# Patient Record
Sex: Male | Born: 1945 | Race: White | Hispanic: No | Marital: Married | Smoking: Never smoker
Health system: Southern US, Community
[De-identification: ages and names within clinical notes are randomized; demographics above are authoritative.]

## PROBLEM LIST (undated history)

## (undated) DIAGNOSIS — G35 Multiple sclerosis: Secondary | ICD-10-CM

## (undated) DIAGNOSIS — G2581 Restless legs syndrome: Secondary | ICD-10-CM

## (undated) DIAGNOSIS — E039 Hypothyroidism, unspecified: Secondary | ICD-10-CM

## (undated) DIAGNOSIS — Z87820 Personal history of traumatic brain injury: Secondary | ICD-10-CM

## (undated) DIAGNOSIS — R413 Other amnesia: Secondary | ICD-10-CM

## (undated) DIAGNOSIS — H3320 Serous retinal detachment, unspecified eye: Secondary | ICD-10-CM

## (undated) DIAGNOSIS — E785 Hyperlipidemia, unspecified: Secondary | ICD-10-CM

## (undated) DIAGNOSIS — E669 Obesity, unspecified: Secondary | ICD-10-CM

## (undated) DIAGNOSIS — C73 Malignant neoplasm of thyroid gland: Secondary | ICD-10-CM

## (undated) DIAGNOSIS — I10 Essential (primary) hypertension: Secondary | ICD-10-CM

## (undated) HISTORY — DX: Hyperlipidemia, unspecified: E78.5

## (undated) HISTORY — DX: Multiple sclerosis: G35

## (undated) HISTORY — DX: Restless legs syndrome: G25.81

## (undated) HISTORY — DX: Obesity, unspecified: E66.9

## (undated) HISTORY — DX: Hypothyroidism, unspecified: E03.9

## (undated) HISTORY — PX: CATARACT EXTRACTION: SUR2

## (undated) HISTORY — DX: Serous retinal detachment, unspecified eye: H33.20

## (undated) HISTORY — DX: Personal history of traumatic brain injury: Z87.820

## (undated) HISTORY — DX: Malignant neoplasm of thyroid gland: C73

## (undated) HISTORY — PX: OTHER SURGICAL HISTORY: SHX169

## (undated) HISTORY — PX: TONSILLECTOMY: SUR1361

## (undated) HISTORY — DX: Essential (primary) hypertension: I10

## (undated) HISTORY — DX: Other amnesia: R41.3

## (undated) HISTORY — PX: VASECTOMY: SHX75

---

## 2001-07-07 DIAGNOSIS — I10 Essential (primary) hypertension: Secondary | ICD-10-CM | POA: Insufficient documentation

## 2003-08-10 DIAGNOSIS — L219 Seborrheic dermatitis, unspecified: Secondary | ICD-10-CM | POA: Insufficient documentation

## 2003-08-10 DIAGNOSIS — J189 Pneumonia, unspecified organism: Secondary | ICD-10-CM | POA: Insufficient documentation

## 2003-12-24 DIAGNOSIS — Z9852 Vasectomy status: Secondary | ICD-10-CM | POA: Insufficient documentation

## 2003-12-24 DIAGNOSIS — E039 Hypothyroidism, unspecified: Secondary | ICD-10-CM | POA: Diagnosis present

## 2006-05-24 ENCOUNTER — Encounter: Admission: RE | Admit: 2006-05-24 | Discharge: 2006-05-24 | Payer: Self-pay | Admitting: Family Medicine

## 2006-05-26 ENCOUNTER — Encounter: Admission: RE | Admit: 2006-05-26 | Discharge: 2006-05-26 | Payer: Self-pay | Admitting: Family Medicine

## 2009-09-18 ENCOUNTER — Observation Stay (HOSPITAL_COMMUNITY): Admission: EM | Admit: 2009-09-18 | Discharge: 2009-09-20 | Payer: Self-pay | Admitting: Emergency Medicine

## 2009-09-19 ENCOUNTER — Encounter (INDEPENDENT_AMBULATORY_CARE_PROVIDER_SITE_OTHER): Payer: Self-pay | Admitting: Internal Medicine

## 2009-09-19 ENCOUNTER — Ambulatory Visit: Payer: Self-pay | Admitting: Cardiology

## 2009-12-12 ENCOUNTER — Encounter: Admission: RE | Admit: 2009-12-12 | Discharge: 2009-12-12 | Payer: Self-pay | Admitting: Internal Medicine

## 2010-04-18 LAB — COMPREHENSIVE METABOLIC PANEL
ALT: 16 U/L (ref 0–53)
ALT: 20 U/L (ref 0–53)
AST: 19 U/L (ref 0–37)
AST: 43 U/L — ABNORMAL HIGH (ref 0–37)
Alkaline Phosphatase: 42 U/L (ref 39–117)
Alkaline Phosphatase: 44 U/L (ref 39–117)
CO2: 26 mEq/L (ref 19–32)
CO2: 27 mEq/L (ref 19–32)
Calcium: 8.3 mg/dL — ABNORMAL LOW (ref 8.4–10.5)
Calcium: 8.9 mg/dL (ref 8.4–10.5)
Chloride: 103 mEq/L (ref 96–112)
GFR calc Af Amer: >60 mL/min (ref >60)
GFR calc Af Amer: >60 mL/min (ref >60)
GFR calc non Af Amer: >60 mL/min (ref >60)
GFR calc non Af Amer: >60 mL/min (ref >60)
Glucose, Bld: 106 mg/dL — ABNORMAL HIGH (ref 70–99)
Potassium: 3.6 mEq/L (ref 3.5–5.1)
Potassium: 4.3 mEq/L (ref 3.5–5.1)
Sodium: 136 mEq/L (ref 135–145)
Sodium: 140 mEq/L (ref 135–145)
Total Protein: 6.2 g/dL (ref 6.0–8.3)

## 2010-04-18 LAB — DIFFERENTIAL
Basophils Relative: 0 % (ref 0–1)
Eosinophils Absolute: 0.1 10*3/uL (ref 0.0–0.7)
Eosinophils Relative: 3 % (ref 0–5)
Lymphs Abs: 1.6 10*3/uL (ref 0.7–4.0)
Monocytes Relative: 9 % (ref 3–12)
Neutrophils Relative %: 54 % (ref 43–77)

## 2010-04-18 LAB — CBC
HCT: 38.4 % — ABNORMAL LOW (ref 39.0–52.0)
HCT: 40.2 % (ref 39.0–52.0)
Hemoglobin: 13.4 g/dL (ref 13.0–17.0)
Hemoglobin: 13.9 g/dL (ref 13.0–17.0)
MCHC: 34.6 g/dL (ref 30.0–36.0)
RBC: 4.25 MIL/uL (ref 4.22–5.81)
WBC: 4.7 10*3/uL (ref 4.0–10.5)
WBC: 5.1 10*3/uL (ref 4.0–10.5)

## 2010-04-18 LAB — LIPID PANEL
Total CHOL/HDL Ratio: 3.8 RATIO
VLDL: 29 mg/dL (ref 0–40)

## 2010-04-18 LAB — POCT CARDIAC MARKERS
CKMB, poc: <1 ng/mL — ABNORMAL LOW (ref 1.0–8.0)
Myoglobin, poc: 103 ng/mL (ref 12–200)

## 2010-04-18 LAB — CSF CELL COUNT WITH DIFFERENTIAL: WBC, CSF: 1 /mm3 (ref 0–5)

## 2010-04-18 LAB — SEDIMENTATION RATE: Sed Rate: 7 mm/hr (ref 0–16)

## 2010-04-18 LAB — ANA: Anti Nuclear Antibody(ANA): NEGATIVE

## 2010-04-18 LAB — PROTEIN AND GLUCOSE, CSF: Total  Protein, CSF: 61 mg/dL — ABNORMAL HIGH (ref 15–45)

## 2010-04-18 LAB — T4, FREE: Free T4: 1.63 ng/dL (ref 0.80–1.80)

## 2012-06-01 ENCOUNTER — Telehealth: Payer: Self-pay | Admitting: *Deleted

## 2012-06-01 NOTE — Telephone Encounter (Signed)
I called patient. He just converted to Medicare through [employer]. His co-pay now for the Copaxone is $2000 a month. We will check and see if any patient assistance program can be used for him. All MS medications are going to be expensive, and Copaxone is probably of an average cost.

## 2012-06-01 NOTE — Telephone Encounter (Signed)
Patient called stating he has Medicare/Medicare supplement and the cost of Copaxone is about $2000.00 a month. Patient would like to know if there's another medication that's not so expensive.

## 2012-06-02 NOTE — Telephone Encounter (Signed)
I called the patient.  Provided him with the number to [employer] [phone]).  They will ask him questions about his Insurance and financial situation.  They may be able to offer him some assistance.  He says he will call them and see if they can help.

## 2012-06-06 ENCOUNTER — Ambulatory Visit (INDEPENDENT_AMBULATORY_CARE_PROVIDER_SITE_OTHER): Payer: Medicare Other | Admitting: Nurse Practitioner

## 2012-06-06 ENCOUNTER — Encounter: Payer: Self-pay | Admitting: Nurse Practitioner

## 2012-06-06 VITALS — BP 121/72 | HR 64 | Ht 70.0 in | Wt 216.0 lb

## 2012-06-06 DIAGNOSIS — G35 Multiple sclerosis: Secondary | ICD-10-CM

## 2012-06-06 DIAGNOSIS — R413 Other amnesia: Secondary | ICD-10-CM

## 2012-06-06 DIAGNOSIS — G35D Multiple sclerosis, unspecified: Secondary | ICD-10-CM

## 2012-06-06 NOTE — Progress Notes (Signed)
HPI: Patient returns for followup after last visit with Dr. Anne Hahn 12/14/2011. History multiple sclerosis and has done well on Copaxone. He has not had side effects of the medication  He was placed on Adderall at his last visit to help with ability to focus but is currently not taking that medication. He is also asking about getting off of the Copaxone  since he has Medicare and has been told he has a $2000 copay. Most recent MRI , November , 2013 with multiple chronic demyelinating lesions but no acute lesions.He denies  spasms, focal weakness, sensory changes, visual changes, speech or swallowing problems, no problems with bowel or bladder function.    ROS:   snoring, aching muscles, not enough sleep, RLS, insomnia, memory loss, confusion,  Physical Exam General: well developed, well nourished, seated, in no evident distress Head: head normocephalic and atraumatic. Oropharynx benign Neck: supple with no carotid or supraclavicular bruits Cardiovascular: regular rate and rhythm, no murmurs  Neurologic Exam Mental Status: Awake and  alert. Oriented to place and time. Follows all commands  Cranial Nerves: Fundoscopic exam reveals flat disc margins. Pupils equal, briskly reactive to light. Extraocular movements full without nystagmus. Visual fields full to confrontation. Hearing intact and symmetric to finger snap. Facial sensation intact. Face, tongue, palate move normally and symmetrically. Neck flexion and extension normal.  Motor: Normal bulk and tone. Normal strength in all tested extremity muscles. Sensory.: intact to touch and pinprick and vibratory.  Coordination: Rapid alternating movements normal in all extremities. Finger-to-nose and heel-to-shin performed accurately bilaterally. Gait and Station: Arises from chair without difficulty. Stance is normal. Gait demonstrates normal stride length and balance . Able to heel, toe and tandem walk without difficulty.  Reflexes: 1+ and symmetric. Toes  downgoing.     ASSESSMENT: Multiple sclerosis currently on Copaxone without side effects or exacerbations. Mild memory disturbance, Adderall was not helpful. Patient does not want to be on any medication.   PLAN: Explained the importance of disease modifying medications even though his most recent MRI did not have active lesions. Given him a copy of his most recent MRI Given the number for [employer] to check with patient assistance for his Copaxone Follow up in 6-8 months  Nilda Riggs, GNP-BC APRN

## 2012-06-06 NOTE — Progress Notes (Signed)
I have read the note, and I agree with the clinical assessment and plan.  

## 2012-06-06 NOTE — Patient Instructions (Addendum)
Copy of MRI report given. Please call [employer] about coverage for Copaxone F/U visit in 6 to 8 months

## 2012-06-09 ENCOUNTER — Telehealth: Payer: Self-pay | Admitting: *Deleted

## 2012-06-09 NOTE — Telephone Encounter (Signed)
I called patient. The patient is working on getting patient assistance for the Copaxone. If he is able to get this, I would recommend staying on the Copaxone. This medication is a preventative medication for the multiple sclerosis. Prior MRI scans done had shown good stability. If he remains very stable for greater than 5 or 6 years, it is possible that he may be a will come off of medications.

## 2012-06-09 NOTE — Telephone Encounter (Signed)
Patient called wanting to know if his MS is dormant should he keep taking Copaxone. Eventhough per the nurse Lupita Leash) he should keep taking medication, he would like to speak with physician to confirm.

## 2012-07-04 ENCOUNTER — Other Ambulatory Visit: Payer: Self-pay

## 2012-07-04 MED ORDER — GLATIRAMER ACETATE 20 MG/ML ~~LOC~~ KIT: 20.0000 mg | PACK | Freq: Every day | SUBCUTANEOUS | Status: DC

## 2012-07-06 ENCOUNTER — Telehealth: Payer: Self-pay | Admitting: Neurology

## 2012-07-06 NOTE — Telephone Encounter (Signed)
Patient called stating since its been a month without taking Copaxone and hasn't notice any difference. Patient wants to know if he should continue taking this medication.

## 2012-07-06 NOTE — Telephone Encounter (Signed)
I called patient. The patient has access to Copaxone at this point. I would recommend that he go on the medication and stay on the drug. I discussed this with him.

## 2012-07-07 ENCOUNTER — Telehealth: Payer: Self-pay | Admitting: Neurology

## 2012-07-07 NOTE — Telephone Encounter (Signed)
I called the pharmacy.  They said the patient's insurance will not cover the 40mg , as it is not currently on the formulary.  They state the 20mg  is covered.  I called the patient.  He says that he has a grant and was told he would only have to pay a $20 co-pay.  I called [employer] back again.  Spoke with Thayer Ohm.  He said the patient may be approved for the grant, however, the insurance has to approve the 40mg  dose or the grant will not work.  I was then transferred to [place].  They will fax the necessary forms to complete so we can try and get the insurance to approve the drug.  Once approved, the patient may use his grant.  If it is not approved, patient may have to stay on 20mg  daily injection until his insurance formulary is updated.  [employer] referred patient to the [employer] which has provided the opportunity for this grant.  I will complete and fax back paperwork after it is sent to [place].  Will then have to wait on insurance response.  I called the patient back.  Explained the situation.  He verbalized understanding.

## 2012-07-07 NOTE — Telephone Encounter (Signed)
[facility] called is wanting to know if pt is to be getting his prescription glatiramer (Copaxone) from 20mg  to 40mg .injection? Please call at [phone]. The pharmacist would like to get this prescription filled for pt. Thanks

## 2012-07-07 NOTE — Telephone Encounter (Signed)
Pt is needing [place] to call the pharmacy to change the prescription from 20mg  to 40mg  glatiramer (COPAXONE) 20 MG/ML injection Pharmacy is [facility] [phone]. Any questions please contact pt. Thanks

## 2012-07-07 NOTE — Telephone Encounter (Signed)
I called the pharmacy, apparently the Copaxone dosing has already been verified at 40 mg dose.

## 2012-07-15 ENCOUNTER — Telehealth: Payer: Self-pay

## 2012-07-15 NOTE — Telephone Encounter (Signed)
Leta Jungling from [facility] called, left message saying the Prior Auth request for Copaxone 40mg  has been approved effective 07/12/2012-07/12/2013.  They have notified the patient and an approval letter will be mailed to [place].

## 2012-11-04 ENCOUNTER — Other Ambulatory Visit: Payer: Self-pay | Admitting: Neurology

## 2012-12-14 ENCOUNTER — Encounter (INDEPENDENT_AMBULATORY_CARE_PROVIDER_SITE_OTHER): Payer: Self-pay

## 2012-12-14 ENCOUNTER — Encounter: Payer: Self-pay | Admitting: Neurology

## 2012-12-14 ENCOUNTER — Ambulatory Visit (INDEPENDENT_AMBULATORY_CARE_PROVIDER_SITE_OTHER): Payer: Medicare Other | Admitting: Neurology

## 2012-12-14 ENCOUNTER — Ambulatory Visit: Payer: Medicare Other | Admitting: Nurse Practitioner

## 2012-12-14 VITALS — BP 133/76 | HR 76 | Ht 70.0 in | Wt 214.0 lb

## 2012-12-14 DIAGNOSIS — R413 Other amnesia: Secondary | ICD-10-CM

## 2012-12-14 DIAGNOSIS — G35 Multiple sclerosis: Secondary | ICD-10-CM

## 2012-12-14 NOTE — Progress Notes (Signed)
Reason for visit: Multiple sclerosis  Martin Clark is an 67 y.o. male  History of present illness:  Martin Clark is a 67 year old right-handed white male with a history of multiple sclerosis. The patient reports some issues with memory, and slight fatigue. The patient overall is doing fairly well at this point. The patient does report heat intolerance. The patient indicates that he has had some increased memory problems since a concussion that occurred several years ago. The last MRI of the brain was done in January 2013. This showed a stable white matter changes from 2011. The patient is on Copaxone taking the 40 mg dosing, 3 times a week. The patient is tolerating the Copaxone well. The patient now is on total disability. The patient returns for an evaluation. He is getting annual blood work done through his primary care physician.  Past Medical History  Diagnosis Date  . MS (multiple sclerosis)   . Hypertension   . Dyslipidemia   . Hypothyroidism   . Memory disorder   . Restless legs syndrome   . Retinal detachment     right  . Obesity   . Thyroid cancer   . History of concussion     Past Surgical History  Procedure Laterality Date  . Tonsillectomy    . Vasectomy    . Renal calculi    . Cataract extraction Right   . Vasectomy    . Thyroid cancer resection      Family History  Problem Relation Age of Onset  . Heart failure Mother   . Heart failure Father     Social history:  reports that he has never smoked. He has never used smokeless tobacco. He reports that he drinks alcohol. He reports that he does not use illicit drugs.   No Known Allergies  Medications:  Current Outpatient Prescriptions on File Prior to Visit  Medication Sig Dispense Refill  . levothyroxine (SYNTHROID, LEVOTHROID) 150 MCG tablet Take 150 mcg by mouth daily before breakfast.      . pramipexole (MIRAPEX) 1 MG tablet TAKE 1 TABLET BY MOUTH AT NIGHT  90 tablet  1  . pravastatin (PRAVACHOL) 40 MG  tablet Take 40 mg by mouth daily.       No current facility-administered medications on file prior to visit.    ROS:  Out of a complete 14 system review of symptoms, the patient complains only of the following symptoms, and all other reviewed systems are negative.  Fatigue Memory disturbance  Blood pressure 133/76, pulse 76, height 5\' 10"  (1.778 m), weight 214 lb (97.07 kg).  Physical Exam  General: The patient is alert and cooperative at the time of the examination. The patient is moderately obese.  Skin: No significant peripheral edema is noted.   Neurologic Exam  Mental status: The Mini-Mental status examination done today shows a total score of 30/30.  Cranial nerves: Facial symmetry is present. Speech is normal, no aphasia or dysarthria is noted. Extraocular movements are full. Visual fields are full. Pupils are equal, round, and reactive to light. Discs are flat bilaterally.  Motor: The patient has good strength in all 4 extremities.  Sensory examination: Soft touch sensation on the face, arms, and legs is symmetric.  Coordination: The patient has good finger-nose-finger and heel-to-shin bilaterally.  Gait and station: The patient has a normal gait. Tandem gait is normal. Romberg is negative. No drift is seen.  Reflexes: Deep tendon reflexes are symmetric.   Assessment/Plan:  1. Multiple sclerosis  2.  Mild memory disturbance  The patient overall is doing quite well. The patient will followup in 6 months, and we will consider a repeat MRI of the brain at that time. The patient will continue the Copaxone. Overall, the patient is doing well.  Marlan Palau MD 12/14/2012 8:16 AM  [facility] [address] [place], [place] [address]  Phone [phone] Fax [phone]

## 2012-12-14 NOTE — Patient Instructions (Signed)
Multiple Sclerosis Multiple sclerosis (MS) is a disease of the central nervous system. Its cause is unknown. It is more common in the northern states than in the southern states. There is a higher incidence of MS in women. There is a wide variation in the symptoms (problems) of MS. This is because of the many different ways it affects the central nervous system. It often comes on in episodes or attacks. These attacks may last weeks to months. There may be long periods of nearly no problems between attacks. The main symptoms include visual problems (associated with eye pain), numbness, weakness, and paralysis in extremities (arms/hands and legs/feet). There may also be tremors and problems with balance and walking. The age when MS starts is variable. Advances in medicine continue to improve the treatment of this illness. There is no known cure for MS but there are medications that help. MS is not an inherited illness, although your risk of getting this disease is higher if you have a relative with MS. The best radiologic (x-ray) study for MS is an MRI (magnetic resonance imaging). There are medications available to decrease the number and frequency of attacks. SYMPTOMS  The symptoms of MS are caused by loss of insulation (myelin) of the nerves of the brain. When this happens, brain signals do not get transmitted properly or may not get transmitted at all. Some of the problems caused by this include:   Numbness.  Weakness.  Paralysis in extremities.  Visual problems, eye pain.  Balance problems.  Tremors. DIAGNOSIS  Your caregiver can do studies on you to make this diagnosis. This may include specialized X-rays and spinal fluid studies. HOME CARE INSTRUCTIONS   Take medications as directed by your caregiver. Baclofen is a drug commonly used to reduce muscle spasticity. Steroids are often used for short term relief.  Exercise as directed.  Use physical and occupational therapy as directed by  your caregiver. Careful attention to this medical care can help avoid depression.  See your caregiver if you begin to have problems with depression. This is a common problem in MS. Patients often continue to work many years after the diagnosis of MS. Document Released: 01/17/2000 Document Revised: 04/13/2011 Document Reviewed: 08/25/2006 ExitCare Patient Information 2014 [employer].  

## 2013-02-06 ENCOUNTER — Other Ambulatory Visit: Payer: Self-pay | Admitting: Neurology

## 2013-05-01 ENCOUNTER — Other Ambulatory Visit: Payer: Self-pay | Admitting: Neurology

## 2013-06-15 ENCOUNTER — Ambulatory Visit: Payer: Medicare Other | Admitting: Neurology

## 2013-06-19 ENCOUNTER — Encounter (INDEPENDENT_AMBULATORY_CARE_PROVIDER_SITE_OTHER): Payer: Self-pay

## 2013-06-19 ENCOUNTER — Encounter: Payer: Self-pay | Admitting: Neurology

## 2013-06-19 ENCOUNTER — Ambulatory Visit (INDEPENDENT_AMBULATORY_CARE_PROVIDER_SITE_OTHER): Payer: Medicare Other | Admitting: Neurology

## 2013-06-19 VITALS — BP 111/73 | HR 70 | Ht 70.0 in | Wt 212.0 lb

## 2013-06-19 DIAGNOSIS — R413 Other amnesia: Secondary | ICD-10-CM

## 2013-06-19 DIAGNOSIS — G35 Multiple sclerosis: Secondary | ICD-10-CM

## 2013-06-19 NOTE — Progress Notes (Signed)
Reason for visit: Multiple sclerosis  Martin Clark is an 68 y.o. male  History of present illness:  Martin Clark is a 68 year old right-handed white male with a history of multiple sclerosis. The patient has done well on Copaxone, he reports no new symptoms of numbness, weakness, balance problems, vision changes, or problems controlling the bowels or the bladder. The patient has some mild issues with memory, but he takes a lot of notes and functions fairly well. He has had no problems with side effects on Copaxone, no injection site reactions. The returns to this office for further evaluation. He has had recent blood work done through his primary care physician to include a liver profile and CBC within the last 2 months. He was told that these studies were unremarkable.  Past Medical History  Diagnosis Date  . MS (multiple sclerosis)   . Hypertension   . Dyslipidemia   . Hypothyroidism   . Memory disorder   . Restless legs syndrome   . Retinal detachment     right  . Obesity   . Thyroid cancer   . History of concussion     Past Surgical History  Procedure Laterality Date  . Tonsillectomy    . Vasectomy    . Renal calculi    . Cataract extraction Right   . Vasectomy    . Thyroid cancer resection      Family History  Problem Relation Age of Onset  . Heart failure Mother   . Heart failure Father     Social history:  reports that he has never smoked. He has never used smokeless tobacco. He reports that he drinks alcohol. He reports that he does not use illicit drugs.   No Known Allergies  Medications:  Current Outpatient Prescriptions on File Prior to Visit  Medication Sig Dispense Refill  . COPAXONE 40 MG/ML SOSY Inject 40ml (40mg ) subcutaneously three times a week.  12 Syringe  6  . levothyroxine (SYNTHROID, LEVOTHROID) 150 MCG tablet Take 150 mcg by mouth daily before breakfast.      . lisinopril-hydrochlorothiazide (PRINZIDE,ZESTORETIC) 10-12.5 MG per tablet Take  10-12.5 tablets by mouth daily.      . pramipexole (MIRAPEX) 1 MG tablet TAKE 1 TABLET AT NIGHT  90 tablet  1  . pravastatin (PRAVACHOL) 40 MG tablet Take 40 mg by mouth daily.       No current facility-administered medications on file prior to visit.    ROS:  Out of a complete 14 system review of symptoms, the patient complains only of the following symptoms, and all other reviewed systems are negative.  Restless legs, insomnia, snoring Memory loss  Blood pressure 111/73, pulse 70, height 5\' 10"  (1.778 m), weight 212 lb (96.163 kg).  Physical Exam  General: The patient is alert and cooperative at the time of the examination.  Skin: No significant peripheral edema is noted.   Neurologic Exam  Mental status: The Mini-Mental status examination done today shows a total score of 29/30.  Cranial nerves: Facial symmetry is present. Speech is normal, no aphasia or dysarthria is noted. Extraocular movements are full. Visual fields are full. Pupils are equal, round, and reactive to light. Discs are flat bilaterally.  Motor: The patient has good strength in all 4 extremities.  Sensory examination: Soft touch sensation is symmetric on the face, arms, and legs.  Coordination: The patient has good finger-nose-finger and heel-to-shin bilaterally.  Gait and station: The patient has a normal gait. Tandem gait is normal.  Romberg is negative. No drift is seen.  Reflexes: Deep tendon reflexes are symmetric.   Assessment/Plan:  1. Multiple sclerosis  2. Restless leg syndrome  The patient will continue the Copaxone for now. The will have a followup MRI of the brain done compared to a prior study done in 2013. The patient will followup in one year otherwise. He appears quite stable.  Jill Alexanders MD 06/19/2013 3:59 PM  [facility] [address] [place], [place] [address]  Phone [phone] Fax [phone]

## 2013-06-19 NOTE — Patient Instructions (Signed)
Multiple Sclerosis  Multiple sclerosis (MS) is a disease of the central nervous system. It leads to loss of the insulating covering of the nerves (myelin sheath) of your brain. When this happens, brain signals do not get transmitted properly or may not get transmitted at all. The symptoms of MS occur in episodes or attacks. These attacks may last weeks to months. There may be long periods of nearly no problems between attacks. The age of onset of MS varies.   CAUSES  The cause of MS is unknown. However, it is more common in the northern [place] than in the southern [place].  RISK FACTORS  There is a higher incidence of MS in women than in men. MS is not an inherited illness, although your risk of MS is higher if you have a relative with MS.  SIGNS AND SYMPTOMS   The symptoms of MS occur in episodes or attacks. These attacks may last weeks to months. There may be long periods of almost no symptoms between attacks.  The symptoms of MS vary. This is because of the many different ways it affects the central nervous system. The main symptoms of MS include:   Vision problems and eye pain.   Numbness.   Weakness.   Paralysis in your arms, hands, feet, and legs (extremities).   Balance problems.   Tremors.  DIAGNOSIS   Your health care provider can diagnose MS with the help of imaging exams and lab tests. These may include specialized X-ray exams and spinal fluid tests. The best imaging exam to confirm a diagnosis of MS is MRI.  TREATMENT   There is no known cure for MS, but there are medicines that can decrease the number and frequency of attacks. Steroids are often used for short-term relief. Physical and occupational therapy may also help.  HOME CARE INSTRUCTIONS    Take medicines as directed by your health care provider.   Exercise as directed by your health care provider.  SEEK MEDICAL CARE IF:  You begin to feel depressed.  SEEK IMMEDIATE MEDICAL CARE IF:   You develop paralysis.   You develop  problems with bladder, bowel, or sexual function.   You develop mental changes, such as forgetfulness or mood swings.   You have a seizure.  Document Released: 01/17/2000 Document Revised: 11/09/2012 Document Reviewed: 09/26/2012  Exit[employer] Patient Information 2014 [employer].

## 2013-06-24 ENCOUNTER — Ambulatory Visit
Admission: RE | Admit: 2013-06-24 | Discharge: 2013-06-24 | Disposition: A | Discharge status: Home / Self Care | Payer: Medicare Other | Source: Ambulatory Visit | Attending: Neurology | Admitting: Neurology

## 2013-06-24 DIAGNOSIS — R413 Other amnesia: Secondary | ICD-10-CM

## 2013-06-24 DIAGNOSIS — G35 Multiple sclerosis: Secondary | ICD-10-CM

## 2013-06-24 MED ORDER — GADOBENATE DIMEGLUMINE 529 MG/ML IV SOLN
20.0000 mL | Freq: Once | INTRAVENOUS | Status: AC | PRN
  Administered 2013-06-24: 20 mL via INTRAVENOUS

## 2013-06-26 ENCOUNTER — Telehealth: Payer: Self-pay | Admitting: Neurology

## 2013-06-26 NOTE — Telephone Encounter (Signed)
I called the patient MRI of the brain appears to be stable from the prior study.   MRI brain 06/25/13:  IMPRESSION: Abnormal MRI scan of the brain showing multiple periventricular and subcortical white matter hyperintensities consistent consistent with multiple sclerosis. The presence of T1 black holes indicates chronic disease. No enhancing lesions are noted.  I did a comparison to the scan done 09/18/2009, and I see no changes on this current scan.

## 2013-08-09 ENCOUNTER — Telehealth: Payer: Self-pay | Admitting: Neurology

## 2013-08-09 NOTE — Telephone Encounter (Signed)
We were not aware a PA was needed prior to this call.  I have contacted ins and provided all clinical info, they are reviewing the request and will contact both the patient and [place] of the outcome.  I called [employer] back.  Was transferred to a general voicemail, left message.

## 2013-08-09 NOTE — Telephone Encounter (Signed)
[employer] calling to check on the status of a prior authorization for patient's Copaxone. Please return call and advise.

## 2013-08-13 ENCOUNTER — Telehealth: Payer: Self-pay

## 2013-08-13 NOTE — Telephone Encounter (Signed)
[employer] sent [place] a letter saying they have approved our request for coverage on Copaxone effective until 08/10/2014 Ref Member # [id number] Fax ID [phone] [place] [phone]

## 2013-10-29 ENCOUNTER — Other Ambulatory Visit: Payer: Self-pay | Admitting: Neurology

## 2013-11-09 ENCOUNTER — Other Ambulatory Visit: Payer: Self-pay | Admitting: Neurology

## 2013-12-20 ENCOUNTER — Encounter: Payer: Self-pay | Admitting: Neurology

## 2013-12-26 ENCOUNTER — Encounter: Payer: Self-pay | Admitting: Neurology

## 2014-04-23 ENCOUNTER — Other Ambulatory Visit: Payer: Self-pay | Admitting: Neurology

## 2014-06-28 ENCOUNTER — Ambulatory Visit (INDEPENDENT_AMBULATORY_CARE_PROVIDER_SITE_OTHER): Payer: Medicare Other | Admitting: Neurology

## 2014-06-28 ENCOUNTER — Encounter: Payer: Self-pay | Admitting: Neurology

## 2014-06-28 VITALS — BP 147/79 | HR 69 | Ht 70.0 in | Wt 215.2 lb

## 2014-06-28 DIAGNOSIS — G35 Multiple sclerosis: Secondary | ICD-10-CM

## 2014-06-28 DIAGNOSIS — R413 Other amnesia: Secondary | ICD-10-CM | POA: Diagnosis not present

## 2014-06-28 NOTE — Progress Notes (Signed)
Reason for visit: Multiple sclerosis  Martin Clark is an 69 y.o. male  History of present illness:  Martin Clark is a 68 year old right-handed white male with a history of multiple sclerosis. Since last seen one year ago, the patient has done quite well on Copaxone. He is tolerating the medication well. He denies any issues with numbness, weakness, balance problems, or visual changes that are new. The patient is staying active, doing yard work. He indicates a good energy level. He believes that his memory issues have improved over time. He had MRI evaluation of the brain about one year ago that showed good stability from the prior studies. The patient has had recent blood work done through his primary care physician within the last several weeks, and the patient indicates that the blood work was unremarkable. He returns for an evaluation.  Past Medical History  Diagnosis Date  . MS (multiple sclerosis)   . Hypertension   . Dyslipidemia   . Hypothyroidism   . Memory disorder   . Restless legs syndrome   . Retinal detachment     right  . Obesity   . Thyroid cancer   . History of concussion     Past Surgical History  Procedure Laterality Date  . Tonsillectomy    . Vasectomy    . Renal calculi    . Cataract extraction Right   . Vasectomy    . Thyroid cancer resection      Family History  Problem Relation Age of Onset  . Heart failure Mother   . Heart failure Father     Social history:  reports that he has never smoked. He has never used smokeless tobacco. He reports that he drinks alcohol. He reports that he does not use illicit drugs.   No Known Allergies  Medications:  Prior to Admission medications   Medication Sig Start Date End Date Taking? Authorizing Provider  COPAXONE 40 MG/ML SOSY Inject 63ml (40mg ) subcutaneously three times a week. 11/10/13  Yes Kathrynn Ducking, MD  levothyroxine (SYNTHROID, LEVOTHROID) 150 MCG tablet Take 150 mcg by mouth daily before breakfast.    Yes Historical Provider, MD  lisinopril-hydrochlorothiazide (PRINZIDE,ZESTORETIC) 10-12.5 MG per tablet Take 10-12.5 tablets by mouth daily. 10/17/12  Yes Historical Provider, MD  pramipexole (MIRAPEX) 1 MG tablet TAKE 1 TABLET BY MOUTH EVERY DAY AT BEDTIME 04/23/14  Yes Kathrynn Ducking, MD  pravastatin (PRAVACHOL) 40 MG tablet Take 40 mg by mouth daily.   Yes Historical Provider, MD    ROS:  Out of a complete 14 system review of symptoms, the patient complains only of the following symptoms, and all other reviewed systems are negative.  Mild memory disturbance  Blood pressure 147/79, pulse 69, height 5\' 10"  (1.778 m), weight 215 lb 3.2 oz (97.614 kg).  Physical Exam  General: The patient is alert and cooperative at the time of the examination.  Skin: No significant peripheral edema is noted.   Neurologic Exam  Mental status: The patient is alert and oriented x 3 at the time of the examination. The patient has apparent normal recent and remote memory, with an apparently normal attention span and concentration ability. Mini-Mental Status Examination done today shows a total score 29/30.   Cranial nerves: Facial symmetry is present. Speech is normal, no aphasia or dysarthria is noted. Extraocular movements are full. Visual fields are full. Pupils are equal, round, and reactive to light. Discs are flat bilaterally.  Motor: The patient has good strength in  all 4 extremities.  Sensory examination: Soft touch sensation is symmetric on the face, arms, and legs.  Coordination: The patient has good finger-nose-finger and heel-to-shin bilaterally.  Gait and station: The patient has a normal gait. Tandem gait is normal. Romberg is negative. No drift is seen.  Reflexes: Deep tendon reflexes are symmetric.   MRI brain 06/25/13:  IMPRESSION: Abnormal MRI scan of the brain showing multiple periventricular and subcortical white matter hyperintensities consistent with multiple sclerosis. The  presence of T1 black holes indicates chronic disease. No enhancing lesions are noted.  * MRI scan images were reviewed online. I agree with the written report.    Assessment/Plan:  1. Multiple sclerosis  2. Mild memory disturbance  The patient is doing quite well at this time. He is tolerating the Copaxone, we will continue this medication. He will follow-up in one year, or sooner if needed.  Jill Alexanders MD 06/28/2014 7:58 PM  [facility] [address] [place], [place] [address]  Phone [phone] Fax [phone]

## 2014-06-28 NOTE — Patient Instructions (Signed)

## 2014-07-19 ENCOUNTER — Other Ambulatory Visit: Payer: Self-pay | Admitting: Neurology

## 2014-08-09 ENCOUNTER — Telehealth: Payer: Self-pay | Admitting: Neurology

## 2014-08-09 NOTE — Telephone Encounter (Signed)
[facility] is inquiring if prior auth for COPAXONE 40 MG/ML SOSY has been received. She states she faxed the form on 07/25/14. She is refaxing it today to fax # [phone]. I also gave her the [phone] fax #.

## 2014-08-09 NOTE — Telephone Encounter (Signed)
This Prior Auth was already approved effective until 07/29/2015 Ref # [id number].  I called back.  Spoke with Venus.  She asked that we fax the approval letter to them at [phone].  They will call us back if anything further is needed.

## 2014-11-07 ENCOUNTER — Other Ambulatory Visit: Payer: Self-pay

## 2014-11-07 MED ORDER — GLATIRAMER ACETATE 40 MG/ML ~~LOC~~ SOSY: PREFILLED_SYRINGE | SUBCUTANEOUS | Status: DC

## 2014-11-12 ENCOUNTER — Telehealth: Payer: Self-pay | Admitting: Neurology

## 2014-11-12 MED ORDER — GLATIRAMER ACETATE 40 MG/ML ~~LOC~~ SOSY: PREFILLED_SYRINGE | SUBCUTANEOUS | Status: DC

## 2014-11-12 NOTE — Telephone Encounter (Signed)
[facility] is calling to get a refill for Rx Glatiramer Acetate 40 mg. Please call [phone].

## 2014-11-12 NOTE — Telephone Encounter (Signed)
Rx has been provided.  

## 2015-01-14 ENCOUNTER — Telehealth: Payer: Self-pay | Admitting: Neurology

## 2015-01-14 NOTE — Telephone Encounter (Signed)
I called the patient. He still has about 80 days worth of Copaxone. I advised that I would check with Janett Billow on other foundations/resources and we would call him back. I advised Janett Billow is out of the office until Wednesday.

## 2015-01-14 NOTE — Telephone Encounter (Signed)
Patient called to advise he received letter from [facility] stating they are no longer going to cover Glatiramer Acetate (COPAXONE) 40 MG/ML SOSY, if Dr. Jannifer Franklin wants him to continue on this foundation, is there another foundation that will cover it.

## 2015-01-16 NOTE — Telephone Encounter (Signed)
I apologize for the delay, I have been out of the office.  Recommend the patient contact Copaxone Assistance at [phone].  If they are not able to assist the patient, they will have resources for other non-profit organizations that offer assistance and/or grants.  I called back.  Got no answer.  Left message relaying this info.

## 2015-02-21 NOTE — Telephone Encounter (Signed)
I called the patient. He did not receive Jessica's voicemail from 12/14. I gave him the information below for Copaxone Assistance. I asked that he call back to let me know if they are able to help or if we can do anything else for him. He was very Patent attorney.

## 2015-02-21 NOTE — Telephone Encounter (Signed)
Patient called to advise he has been on Copaxone approximately 4 years and is doing real well with it. Patient advised he is having difficulty finding someone who can help with assistance, [facility] is currently searching for resources, has been working on this for approximately 3 weeks and they have almost exhausted every resource however they are still working on it. States if he pays for the medication it will cost him $2,000 out of pocket. Patient wants to do what Dr. Jannifer Franklin says and recommends. "He's the best Neurologist I've ever met".

## 2015-06-18 ENCOUNTER — Other Ambulatory Visit: Payer: Self-pay | Admitting: Family Medicine

## 2015-07-02 ENCOUNTER — Ambulatory Visit (INDEPENDENT_AMBULATORY_CARE_PROVIDER_SITE_OTHER): Payer: Medicare Other | Admitting: Neurology

## 2015-07-02 ENCOUNTER — Encounter: Payer: Self-pay | Admitting: Neurology

## 2015-07-02 VITALS — BP 132/66 | HR 80 | Ht 70.0 in | Wt 215.0 lb

## 2015-07-02 DIAGNOSIS — G35 Multiple sclerosis: Secondary | ICD-10-CM | POA: Diagnosis not present

## 2015-07-02 DIAGNOSIS — R413 Other amnesia: Secondary | ICD-10-CM

## 2015-07-02 NOTE — Progress Notes (Signed)
Reason for visit: Multiple sclerosis  Martin Clark is an 70 y.o. male  History of present illness:  Martin Clark is a 70 year old right-handed white male with a history of multiple sclerosis currently on Copaxone. The patient is doing very well on the medication, he is tolerating the drug. He reports that the medication increases his appetite. He indicates that there have been no new clinical changes over the last year. He denies any visual disturbances, numbness or weakness, balance changes, and he believes that his memory issues have actually improved over time. The patient is having to take fewer notes, and he is remembering things with greater ease. He reports a prior history of atrial fibrillation, but this has not been a recent issue for him, he is not on blood thinners. He has some intermittent issues with right patellar dislocation. He has been exercising on a regular basis, he does some work Chartered certified accountant. He has a good energy level. He returns to this office for an evaluation. The patient has restless legs, at times, he has symptoms at night, even on the dopamine agonist medication.  Past Medical History  Diagnosis Date  . MS (multiple sclerosis) ([place])   . Hypertension   . Dyslipidemia   . Hypothyroidism   . Memory disorder   . Restless legs syndrome   . Retinal detachment     right  . Obesity   . Thyroid cancer ([place])   . History of concussion     Past Surgical History  Procedure Laterality Date  . Tonsillectomy    . Vasectomy    . Renal calculi    . Cataract extraction Right   . Vasectomy    . Thyroid cancer resection      Family History  Problem Relation Age of Onset  . Heart failure Mother   . Heart failure Father     Social history:  reports that he has never smoked. He has never used smokeless tobacco. He reports that he drinks alcohol. He reports that he does not use illicit drugs.   No Known Allergies  Medications:  Prior to Admission  medications   Medication Sig Start Date End Date Taking? Authorizing Provider  Glatiramer Acetate (COPAXONE) 40 MG/ML SOSY Inject 6ml (40mg ) subcutaneously three times a week. 11/12/14   Kathrynn Ducking, MD  levothyroxine (SYNTHROID, LEVOTHROID) 150 MCG tablet Take 150 mcg by mouth daily before breakfast.    Historical Provider, MD  lisinopril-hydrochlorothiazide (PRINZIDE,ZESTORETIC) 10-12.5 MG per tablet Take 10-12.5 tablets by mouth daily. 10/17/12   Historical Provider, MD  pramipexole (MIRAPEX) 1 MG tablet TAKE 1 TABLET BY MOUTH EVERY DAY AT BEDTIME 07/19/14   Kathrynn Ducking, MD  pravastatin (PRAVACHOL) 40 MG tablet Take 40 mg by mouth daily.    Historical Provider, MD    ROS:  Out of a complete 14 system review of symptoms, the patient complains only of the following symptoms, and all other reviewed systems are negative.  Restless legs, insomnia Joint pain  Blood pressure 132/66, pulse 80, height 5\' 10"  (1.778 m), weight 215 lb (97.523 kg).  Physical Exam  General: The patient is alert and cooperative at the time of the examination.  Skin: No significant peripheral edema is noted.   Neurologic Exam  Mental status: The patient is alert and oriented x 3 at the time of the examination. The patient has apparent normal recent and remote memory, with an apparently normal attention span and concentration ability. Mini-Mental Status Examination done today  shows a total score of 30/30.   Cranial nerves: Facial symmetry is present. Speech is normal, no aphasia or dysarthria is noted. Extraocular movements are full. Visual fields are full. On primary gaze, there is some lateral drift of the right eye.  Motor: The patient has good strength in all 4 extremities.  Sensory examination: Soft touch sensation is symmetric on the face, arms, and legs.  Coordination: The patient has good finger-nose-finger and heel-to-shin bilaterally.  Gait and station: The patient has a normal gait. Tandem  gait is normal. Romberg is negative. No drift is seen.  Reflexes: Deep tendon reflexes are symmetric.   MRI brain 06/25/13:  IMPRESSION: Abnormal MRI scan of the brain showing multiple periventricular and subcortical white matter hyperintensities consistent with multiple sclerosis. The presence of T1 black holes indicates chronic disease. No enhancing lesions are noted.  * MRI scan images were reviewed online. I agree with the written report.    Assessment/Plan:  1. Multiple sclerosis  2. Restless leg syndrome  3. Mild memory disturbance  The patient appears to be doing better with the memory, he indicates that the multiple sclerosis is stable. The last MRI of the brain was done 2 years ago, we will plan on repeating this 1 year from now. He will follow-up in one year. He is getting blood work every 6 months through his primary care physician.  Jill Alexanders MD 07/02/2015 8:06 PM  [facility] [address] [place], [place] [address]  Phone [phone] Fax [phone]

## 2015-07-05 ENCOUNTER — Other Ambulatory Visit: Payer: Self-pay | Admitting: Family Medicine

## 2015-08-07 ENCOUNTER — Other Ambulatory Visit: Payer: Self-pay | Admitting: Neurology

## 2015-09-02 ENCOUNTER — Telehealth: Payer: Self-pay | Admitting: Neurology

## 2015-09-02 NOTE — Telephone Encounter (Signed)
[employer] is calling and states the medication Glatiramer Acetate (COPAXONE) 40 MG/ML SOSY has been approved for 1 year starting today.

## 2015-09-02 NOTE — Telephone Encounter (Signed)
[facility] called requesting PA on Glatiramer Acetate (COPAXONE) 40 MG/ML SOSY. Phone: [phone]   Fax: [phone]

## 2015-09-02 NOTE — Telephone Encounter (Signed)
PA sent through [facility] 08/22/15 but did not get electronic response. PA initiated via TC today.

## 2015-09-02 NOTE — Telephone Encounter (Signed)
Notified [facility] pharmacy of PA approval.

## 2015-09-04 MED ORDER — GLATIRAMER ACETATE 40 MG/ML ~~LOC~~ SOSY: PREFILLED_SYRINGE | SUBCUTANEOUS | 5 refills | Status: DC

## 2015-09-04 NOTE — Addendum Note (Signed)
Addended by: Monte Fantasia on: 09/04/2015 10:39 AM   Modules accepted: Orders

## 2015-09-05 NOTE — Telephone Encounter (Signed)
Stephanie/[employer] [phone] called to advise, tier exception for COPAXONE is denied. Not eligible for tier exception, it's on a tier 5 level. Advised that a letter will be mailed to our office.

## 2016-03-05 DIAGNOSIS — I129 Hypertensive chronic kidney disease with stage 1 through stage 4 chronic kidney disease, or unspecified chronic kidney disease: Secondary | ICD-10-CM | POA: Diagnosis not present

## 2016-03-05 DIAGNOSIS — R7309 Other abnormal glucose: Secondary | ICD-10-CM | POA: Diagnosis not present

## 2016-03-05 DIAGNOSIS — N400 Enlarged prostate without lower urinary tract symptoms: Secondary | ICD-10-CM | POA: Diagnosis not present

## 2016-03-13 DIAGNOSIS — E039 Hypothyroidism, unspecified: Secondary | ICD-10-CM | POA: Diagnosis not present

## 2016-03-13 DIAGNOSIS — E7800 Pure hypercholesterolemia, unspecified: Secondary | ICD-10-CM | POA: Diagnosis not present

## 2016-03-13 DIAGNOSIS — R972 Elevated prostate specific antigen [PSA]: Secondary | ICD-10-CM | POA: Diagnosis not present

## 2016-03-13 DIAGNOSIS — I10 Essential (primary) hypertension: Secondary | ICD-10-CM | POA: Diagnosis not present

## 2016-03-23 ENCOUNTER — Telehealth: Payer: Self-pay | Admitting: *Deleted

## 2016-03-23 NOTE — Telephone Encounter (Signed)
Called and spoke to pt. Insurance in system is old. Pt not active on this.  He gave my new insurance information: [employer] Member ID #: [id number] Rx BIN: [id number] RX PCN: [id number] RX Group: [id number] Customer service pharmacy#: [phone] Provider #: [phone]  Per patient, he received copaxone on 03/19/16 and has enough to last a month. Advised we will keep him up to date on PA. He verbalized understanding.

## 2016-03-25 NOTE — Telephone Encounter (Signed)
Submitted PA copaxone information to [facility]. Fax: [phone]. Received confirmation. Awaiting response.

## 2016-03-27 NOTE — Telephone Encounter (Signed)
PA copaxone approved from 03/26/16-02/01/17.

## 2016-05-07 DIAGNOSIS — R062 Wheezing: Secondary | ICD-10-CM | POA: Diagnosis not present

## 2016-05-07 DIAGNOSIS — J309 Allergic rhinitis, unspecified: Secondary | ICD-10-CM | POA: Diagnosis not present

## 2016-07-01 ENCOUNTER — Encounter: Payer: Self-pay | Admitting: Neurology

## 2016-07-01 ENCOUNTER — Ambulatory Visit (INDEPENDENT_AMBULATORY_CARE_PROVIDER_SITE_OTHER): Payer: PPO | Admitting: Neurology

## 2016-07-01 ENCOUNTER — Encounter (INDEPENDENT_AMBULATORY_CARE_PROVIDER_SITE_OTHER): Payer: Self-pay

## 2016-07-01 VITALS — BP 131/76 | HR 69 | Resp 20 | Ht 70.0 in | Wt 218.0 lb

## 2016-07-01 DIAGNOSIS — R413 Other amnesia: Secondary | ICD-10-CM

## 2016-07-01 DIAGNOSIS — G35 Multiple sclerosis: Secondary | ICD-10-CM

## 2016-07-01 DIAGNOSIS — G2581 Restless legs syndrome: Secondary | ICD-10-CM | POA: Diagnosis not present

## 2016-07-01 HISTORY — DX: Restless legs syndrome: G25.81

## 2016-07-01 MED ORDER — PRAMIPEXOLE DIHYDROCHLORIDE 1 MG PO TABS: 1.0000 mg | ORAL_TABLET | Freq: Every day | ORAL | 3 refills | Status: AC

## 2016-07-01 NOTE — Progress Notes (Signed)
Reason for visit: Multiple sclerosis  Arun Herrod is an 71 y.o. male  History of present illness:  Mr. Mcmahill is a 71 year old right-handed white male with a history of multiple sclerosis. The patient is on Copaxone, he gets blood work done through his primary care physician on a regular basis. The patient has not had any new complaints of numbness, weakness, visual changes, gait disturbance, or difficulty controlling the bowels or the bladder. He is tolerating the Copaxone quite well. His insurance may change his medication to generic in the near future. The patient also has restless leg syndrome, this is well controlled with Mirapex as long as he does not eat before going to bed. The patient takes 1 mg of Mirapex in the evening hours. He has reported some problems with memory, but he is doing better in this regard, he has not had any decline in memory since last seen. He returns to this office for an evaluation. He reports no other new medical problems.  Past Medical History:  Diagnosis Date  . Dyslipidemia   . History of concussion   . Hypertension   . Hypothyroidism   . Memory disorder   . MS (multiple sclerosis) ([place])   . Obesity   . Restless legs syndrome   . Retinal detachment    right  . Thyroid cancer [facility])     Past Surgical History:  Procedure Laterality Date  . CATARACT EXTRACTION Right   . renal calculi    . Thyroid cancer resection    . TONSILLECTOMY    . VASECTOMY    . VASECTOMY      Family History  Problem Relation Age of Onset  . Heart failure Mother   . Heart failure Father     Social history:  reports that he has never smoked. He has never used smokeless tobacco. He reports that he drinks alcohol. He reports that he does not use drugs.   No Known Allergies  Medications:  Prior to Admission medications   Medication Sig Start Date End Date Taking? Authorizing Provider  Glatiramer Acetate (COPAXONE) 40 MG/ML SOSY Inject 98ml (40mg ) subcutaneously three  times a week. 09/04/15  Yes Kathrynn Ducking, MD  levothyroxine (SYNTHROID, LEVOTHROID) 150 MCG tablet Take 150 mcg by mouth daily before breakfast.   Yes [provider]  lisinopril-hydrochlorothiazide (PRINZIDE,ZESTORETIC) 10-12.5 MG per tablet Take 10-12.5 tablets by mouth daily. 10/17/12  Yes [provider]  pramipexole (MIRAPEX) 1 MG tablet TAKE ONE TABLET BY MOUTH AT BEDTIME 08/07/15  Yes Kathrynn Ducking, MD  pravastatin (PRAVACHOL) 40 MG tablet Take 40 mg by mouth daily.   Yes [provider]    ROS:  Out of a complete 14 system review of symptoms, the patient complains only of the following symptoms, and all other reviewed systems are negative.  Restless legs  Blood pressure 131/76, pulse 69, resp. rate 20, height 5\' 10"  (1.778 m), weight 218 lb (98.9 kg).  Physical Exam  General: The patient is alert and cooperative at the time of the examination.  Skin: No significant peripheral edema is noted.   Neurologic Exam  Mental status: The patient is alert and oriented x 3 at the time of the examination. The patient has apparent normal recent and remote memory, with an apparently normal attention span and concentration ability. Mini-Mental Status Examination done today shows a total score 28/30.   Cranial nerves: Facial symmetry is present. Speech is normal, no aphasia or dysarthria is noted. Extraocular movements  are full. Visual fields are full. Pupils are equal, round, and reactive to light. Discs are flat bilaterally.  Motor: The patient has good strength in all 4 extremities.  Sensory examination: Soft touch sensation is symmetric on the face, arms, and legs.  Coordination: The patient has good finger-nose-finger and heel-to-shin bilaterally.  Gait and station: The patient has a normal gait. Tandem gait is normal. Romberg is negative. No drift is seen.  Reflexes: Deep tendon reflexes are symmetric.   Assessment/Plan:  1. Multiple  sclerosis  2. Restless leg syndrome  3. Mild memory disturbance  Overall, the patient is doing fairly well, he is tolerating the Copaxone. The patient is getting blood work done through his primary care physician. The last MRI of the brain was done in 2015, we will consider a repeat MRI on his next revisit. He will follow-up in 6 months.  Jill Alexanders MD 07/01/2016 8:11 AM  [facility] [address] [place], [place] [address]  Phone [phone] Fax [phone]

## 2016-08-18 DIAGNOSIS — H35371 Puckering of macula, right eye: Secondary | ICD-10-CM | POA: Diagnosis not present

## 2016-08-18 DIAGNOSIS — H50331 Intermittent monocular exotropia, right eye: Secondary | ICD-10-CM | POA: Diagnosis not present

## 2016-08-18 DIAGNOSIS — H5034 Intermittent alternating exotropia: Secondary | ICD-10-CM | POA: Diagnosis not present

## 2016-08-18 DIAGNOSIS — H43391 Other vitreous opacities, right eye: Secondary | ICD-10-CM | POA: Diagnosis not present

## 2016-08-20 ENCOUNTER — Telehealth: Payer: Self-pay | Admitting: *Deleted

## 2016-08-20 NOTE — Telephone Encounter (Signed)
Called and spoke with patient on home number. Tried mobile, but automated message stated number either changed, disconnected, or no longer in service. I relayed this to pt to check with phone service to make sure everything ok with cell number.   I advised I received request from [place] part [place] on covermymeds to complete PA on copaxone. I verified with him that he now uses [employer] and does not use [employer] [place]. I advised I will try and reach out to [facility] to advise them that pt no longer using their services. He might have to call himself. If he does, I will call back and let him know. He verbalized understanding.

## 2016-08-20 NOTE — Telephone Encounter (Signed)
Called and spoke with May from [facility] [place]. She stated their records show that he had plan [employer] HMO that was effective 02/2015-02/02/2016. He no longer has services through them. She advised me to disregard request to do PA copaxone on covermymeds from them. She states this is probably a Emergency planning/management officer in their system.

## 2016-08-26 ENCOUNTER — Other Ambulatory Visit: Payer: Self-pay | Admitting: *Deleted

## 2016-08-26 MED ORDER — GLATIRAMER ACETATE 40 MG/ML ~~LOC~~ SOSY: PREFILLED_SYRINGE | SUBCUTANEOUS | 4 refills | Status: DC

## 2016-08-27 ENCOUNTER — Telehealth: Payer: Self-pay | Admitting: *Deleted

## 2016-08-27 NOTE — Telephone Encounter (Signed)
Received fax from [place]. PA needed. I called pharamcy d/t PA already being completed back in Feb and approved thru 02/01/17. Spoke with [place]. She stated brand name taken off formulary on insurance. Generic is covered. I placed Briana on hold. Spoke with CW,MD. He approved switching to generic copaxone for pt.   She requested updated med list be faxed to [phone]. Faxed as requested. Received confirmation.   Called pt. Advised him of change from brand name to generic. Pt verbalized understanding and agreeable to this plan. He knows specialty pharmacy will be calling to verify his information.

## 2016-08-28 NOTE — Telephone Encounter (Signed)
Received fax from [facility] for order: Whisper-Ject rx. Faxed signed order back to [phone]. Received fax confirmation.

## 2016-08-31 NOTE — Telephone Encounter (Signed)
Re-faxed order for whisper-ject. Clarification: qty 1 device, no refills. Received fax confirmation.

## 2016-09-15 ENCOUNTER — Telehealth: Payer: Self-pay | Admitting: *Deleted

## 2016-09-15 DIAGNOSIS — Z0000 Encounter for general adult medical examination without abnormal findings: Secondary | ICD-10-CM | POA: Diagnosis not present

## 2016-09-15 DIAGNOSIS — E7800 Pure hypercholesterolemia, unspecified: Secondary | ICD-10-CM | POA: Diagnosis not present

## 2016-09-15 DIAGNOSIS — E559 Vitamin D deficiency, unspecified: Secondary | ICD-10-CM | POA: Diagnosis not present

## 2016-09-15 NOTE — Telephone Encounter (Signed)
Called [facility]. Spoke with Lockie Pares. Advised we received another refill request for whisper-ject device. Advised we faxed back rx on 08/31/16. She asked this be disregarded. rx was sent to pt on 09/10/16. No need for refill. Nothing further needed at this time.

## 2016-09-23 DIAGNOSIS — Z961 Presence of intraocular lens: Secondary | ICD-10-CM | POA: Diagnosis not present

## 2016-09-23 DIAGNOSIS — B880 Other acariasis: Secondary | ICD-10-CM | POA: Diagnosis not present

## 2016-09-23 DIAGNOSIS — Z0000 Encounter for general adult medical examination without abnormal findings: Secondary | ICD-10-CM | POA: Diagnosis not present

## 2016-09-23 DIAGNOSIS — G2581 Restless legs syndrome: Secondary | ICD-10-CM | POA: Diagnosis not present

## 2016-09-23 DIAGNOSIS — R7301 Impaired fasting glucose: Secondary | ICD-10-CM | POA: Diagnosis not present

## 2016-09-23 DIAGNOSIS — H33051 Total retinal detachment, right eye: Secondary | ICD-10-CM | POA: Diagnosis not present

## 2016-09-23 DIAGNOSIS — Z1212 Encounter for screening for malignant neoplasm of rectum: Secondary | ICD-10-CM | POA: Diagnosis not present

## 2016-09-23 DIAGNOSIS — R972 Elevated prostate specific antigen [PSA]: Secondary | ICD-10-CM | POA: Diagnosis not present

## 2016-09-23 DIAGNOSIS — E7800 Pure hypercholesterolemia, unspecified: Secondary | ICD-10-CM | POA: Diagnosis not present

## 2016-09-23 DIAGNOSIS — H2512 Age-related nuclear cataract, left eye: Secondary | ICD-10-CM | POA: Diagnosis not present

## 2016-09-23 DIAGNOSIS — Z8249 Family history of ischemic heart disease and other diseases of the circulatory system: Secondary | ICD-10-CM | POA: Diagnosis not present

## 2016-09-23 DIAGNOSIS — I10 Essential (primary) hypertension: Secondary | ICD-10-CM | POA: Diagnosis not present

## 2016-09-23 DIAGNOSIS — H43392 Other vitreous opacities, left eye: Secondary | ICD-10-CM | POA: Diagnosis not present

## 2016-09-23 DIAGNOSIS — E039 Hypothyroidism, unspecified: Secondary | ICD-10-CM | POA: Diagnosis not present

## 2016-10-21 DIAGNOSIS — Z1211 Encounter for screening for malignant neoplasm of colon: Secondary | ICD-10-CM | POA: Diagnosis not present

## 2016-10-21 DIAGNOSIS — Z8371 Family history of colonic polyps: Secondary | ICD-10-CM | POA: Diagnosis not present

## 2017-01-06 ENCOUNTER — Ambulatory Visit: Payer: PPO | Admitting: Neurology

## 2017-01-06 ENCOUNTER — Encounter: Payer: Self-pay | Admitting: Neurology

## 2017-01-06 ENCOUNTER — Encounter (INDEPENDENT_AMBULATORY_CARE_PROVIDER_SITE_OTHER): Payer: Self-pay

## 2017-01-06 VITALS — BP 145/75 | HR 72 | Ht 70.0 in | Wt 218.0 lb

## 2017-01-06 DIAGNOSIS — R413 Other amnesia: Secondary | ICD-10-CM

## 2017-01-06 DIAGNOSIS — G2581 Restless legs syndrome: Secondary | ICD-10-CM

## 2017-01-06 DIAGNOSIS — G35 Multiple sclerosis: Secondary | ICD-10-CM

## 2017-01-06 NOTE — Patient Instructions (Signed)
    We will check MRI of the brain. 

## 2017-01-06 NOTE — Progress Notes (Signed)
Reason for visit: Multiple sclerosis  Martin Clark is an 71 y.o. male  History of present illness:  Martin Clark is a 71 year old right-handed white male with a history of multiple sclerosis.  The patient is on Copaxone, he is now on the generic form of this medication and he is doing well.  The patient has not had any new symptoms of numbness, weakness, vision changes, or changes in bowel or bladder control.  He denies any balance problems.  He is working part-time, he does a Advice worker work, he stays active.  The patient has restless leg syndrome, off the medication for this does seem to help.  He has had blood work done through his primary care physician 3 months ago.  It has been about 3-1/2 years since he had MRI evaluation of the brain.  Past Medical History:  Diagnosis Date  . Dyslipidemia   . History of concussion   . Hypertension   . Hypothyroidism   . Memory disorder   . MS (multiple sclerosis) ([place])   . Obesity   . Restless legs syndrome   . Retinal detachment    right  . RLS (restless legs syndrome) 07/01/2016  . Thyroid cancer [facility])     Past Surgical History:  Procedure Laterality Date  . CATARACT EXTRACTION Right   . renal calculi    . Thyroid cancer resection    . TONSILLECTOMY    . VASECTOMY    . VASECTOMY      Family History  Problem Relation Age of Onset  . Heart failure Mother   . Heart failure Father     Social history:  reports that  has never smoked. he has never used smokeless tobacco. He reports that he drinks alcohol. He reports that he does not use drugs.   No Known Allergies  Medications:  Prior to Admission medications   Medication Sig Start Date End Date Taking? Authorizing Provider  Glatiramer Acetate (COPAXONE) 40 MG/ML SOSY Inject 73ml (40mg ) subcutaneously three times a week. 08/26/16  Yes Kathrynn Ducking, MD  levothyroxine (SYNTHROID, LEVOTHROID) 150 MCG tablet Take 150 mcg by mouth daily before breakfast.   Yes [provider]  lisinopril-hydrochlorothiazide (PRINZIDE,ZESTORETIC) 10-12.5 MG per tablet Take 10-12.5 tablets by mouth daily. 10/17/12  Yes [provider]  pramipexole (MIRAPEX) 1 MG tablet Take 1 tablet (1 mg total) by mouth at bedtime. 07/01/16  Yes Kathrynn Ducking, MD  pravastatin (PRAVACHOL) 20 MG tablet Take 20 mg by mouth daily.   Yes [provider]    ROS:  Out of a complete 14 system review of symptoms, the patient complains only of the following symptoms, and all other reviewed systems are negative.  Mild memory loss  Blood pressure (!) 145/75, pulse 72, height 5\' 10"  (1.778 m), weight 218 lb (98.9 kg).  Physical Exam  General: The patient is alert and cooperative at the time of the examination.  Patient is moderately obese.  Skin: No significant peripheral edema is noted.   Neurologic Exam  Mental status: The patient is alert and oriented x 3 at the time of the examination. The patient has apparent normal recent and remote memory, with an apparently normal attention span and concentration ability.  Mini-Mental status examination done today shows a total score 30/30.   Cranial nerves: Facial symmetry is present. Speech is normal, no aphasia or dysarthria is noted. Extraocular movements are full. Visual fields are full.  On primary gaze, there is slight  exotropia of the right eye.  Motor: The patient has good strength in all 4 extremities.  Sensory examination: Soft touch sensation is symmetric on the face, arms, and legs.  Coordination: The patient has good finger-nose-finger and heel-to-shin bilaterally.  Gait and station: The patient has a normal gait. Tandem gait is normal. Romberg is negative. No drift is seen.  Reflexes: Deep tendon reflexes are symmetric.   Assessment/Plan:  1.  Multiple sclerosis  2.  Restless leg syndrome  3.  Mild memory disturbance  The patient is doing well with his memory, he has not had much problem with this  recently.  MRI of the brain will be done with and without gadolinium enhancement.  He will follow-up in 6 months point  C. Floyde Parkins MD 01/06/2017 8:05 AM  [facility] [address] [place], [place] [address]  Phone [phone] Fax [phone]

## 2017-01-19 ENCOUNTER — Other Ambulatory Visit: Payer: Self-pay

## 2017-01-19 ENCOUNTER — Ambulatory Visit
Admission: RE | Admit: 2017-01-19 | Discharge: 2017-01-19 | Disposition: A | Discharge status: Home / Self Care | Payer: PPO | Source: Ambulatory Visit | Attending: Neurology | Admitting: Neurology

## 2017-01-19 DIAGNOSIS — G35 Multiple sclerosis: Secondary | ICD-10-CM | POA: Diagnosis not present

## 2017-01-19 MED ORDER — GADOBENATE DIMEGLUMINE 529 MG/ML IV SOLN
20.0000 mL | Freq: Once | INTRAVENOUS | Status: AC | PRN
  Administered 2017-01-19: 20 mL via INTRAVENOUS

## 2017-01-21 ENCOUNTER — Telehealth: Payer: Self-pay | Admitting: Neurology

## 2017-01-21 NOTE — Telephone Encounter (Signed)
I called the patient.  The MRI of the brain remains stable, the patient will continue on the Copaxone.  No change in the scan from 2015.   MRI brain 01/20/17:  IMPRESSION:  Abnormal MRI brain (with and without) demonstrating: 1. Mild round and ovoid periventricular, subcortical chronic demyelinating plaques. No abnormal lesions are seen on post contrast views.   2. No change from MRI on 06/24/13.

## 2017-03-22 DIAGNOSIS — E7800 Pure hypercholesterolemia, unspecified: Secondary | ICD-10-CM | POA: Diagnosis not present

## 2017-03-22 DIAGNOSIS — R7301 Impaired fasting glucose: Secondary | ICD-10-CM | POA: Diagnosis not present

## 2017-03-22 DIAGNOSIS — R972 Elevated prostate specific antigen [PSA]: Secondary | ICD-10-CM | POA: Diagnosis not present

## 2017-03-24 DIAGNOSIS — S29011A Strain of muscle and tendon of front wall of thorax, initial encounter: Secondary | ICD-10-CM | POA: Diagnosis not present

## 2017-03-24 DIAGNOSIS — J069 Acute upper respiratory infection, unspecified: Secondary | ICD-10-CM | POA: Diagnosis not present

## 2017-03-24 DIAGNOSIS — E785 Hyperlipidemia, unspecified: Secondary | ICD-10-CM | POA: Diagnosis not present

## 2017-03-24 DIAGNOSIS — I10 Essential (primary) hypertension: Secondary | ICD-10-CM | POA: Diagnosis not present

## 2017-03-26 DIAGNOSIS — I10 Essential (primary) hypertension: Secondary | ICD-10-CM | POA: Diagnosis not present

## 2017-03-26 DIAGNOSIS — E669 Obesity, unspecified: Secondary | ICD-10-CM | POA: Diagnosis not present

## 2017-03-26 DIAGNOSIS — R972 Elevated prostate specific antigen [PSA]: Secondary | ICD-10-CM | POA: Diagnosis not present

## 2017-03-26 DIAGNOSIS — E039 Hypothyroidism, unspecified: Secondary | ICD-10-CM | POA: Diagnosis not present

## 2017-03-26 DIAGNOSIS — Z23 Encounter for immunization: Secondary | ICD-10-CM | POA: Diagnosis not present

## 2017-03-26 DIAGNOSIS — R7303 Prediabetes: Secondary | ICD-10-CM | POA: Diagnosis not present

## 2017-03-26 DIAGNOSIS — E119 Type 2 diabetes mellitus without complications: Secondary | ICD-10-CM | POA: Diagnosis not present

## 2017-03-26 DIAGNOSIS — G2581 Restless legs syndrome: Secondary | ICD-10-CM | POA: Diagnosis not present

## 2017-03-26 DIAGNOSIS — E7800 Pure hypercholesterolemia, unspecified: Secondary | ICD-10-CM | POA: Diagnosis not present

## 2017-07-08 ENCOUNTER — Encounter: Payer: Self-pay | Admitting: Neurology

## 2017-07-08 ENCOUNTER — Ambulatory Visit: Payer: PPO | Admitting: Neurology

## 2017-07-08 VITALS — BP 149/80 | HR 60 | Ht 70.0 in | Wt 218.0 lb

## 2017-07-08 DIAGNOSIS — G2581 Restless legs syndrome: Secondary | ICD-10-CM

## 2017-07-08 DIAGNOSIS — G35 Multiple sclerosis: Secondary | ICD-10-CM

## 2017-07-08 DIAGNOSIS — R413 Other amnesia: Secondary | ICD-10-CM | POA: Diagnosis not present

## 2017-07-08 NOTE — Progress Notes (Addendum)
Reason for visit: Multiple sclerosis  Martin Clark is an 72 y.o. male  History of present illness:  [name] is a 72 year old right-handed white male with a history of multiple sclerosis.  The patient has what appears to be a significant plague burden by MRI, but his clinical deficits are minimal.  The patient has had a recent MRI of the brain that shows good stability.  He remains on generic Copaxone, he is tolerating the medication well.  He has good energy levels, he works for [employer]  The patient denies any new numbness, weakness, visual changes, or changes in balance or difficulty controlling the bowels or the bladder.  He gets only about 6 hours of sleep at night but this appears to offer him a good energy level during the day.  He returns for an evaluation.   Past Medical History:  Diagnosis Date  . Dyslipidemia   . History of concussion   . Hypertension   . Hypothyroidism   . Memory disorder   . MS (multiple sclerosis) ([place])   . Obesity   . Restless legs syndrome   . Retinal detachment    right  . RLS (restless legs syndrome) 07/01/2016  . Thyroid cancer [facility])     Past Surgical History:  Procedure Laterality Date  . CATARACT EXTRACTION Right   . renal calculi    . Thyroid cancer resection    . TONSILLECTOMY    . VASECTOMY    . VASECTOMY      Family History  Problem Relation Age of Onset  . Heart failure Mother   . Heart failure Father     Social history:  reports that he has never smoked. He has never used smokeless tobacco. He reports that he drinks alcohol. He reports that he does not use drugs.   No Known Allergies  Medications:  Prior to Admission medications   Medication Sig Start Date End Date Taking? Authorizing Provider  Glatiramer Acetate (COPAXONE) 40 MG/ML SOSY Inject 23ml (40mg ) subcutaneously three times a week. 08/26/16  Yes Kathrynn Ducking, MD  levothyroxine (SYNTHROID, LEVOTHROID) 150 MCG tablet Take 150 mcg by mouth daily before  breakfast.   Yes [provider]  lisinopril (PRINIVIL,ZESTRIL) 10 MG tablet Take 10 mg by mouth daily.   Yes [provider]  pramipexole (MIRAPEX) 1 MG tablet Take 1 tablet (1 mg total) by mouth at bedtime. 07/01/16  Yes Kathrynn Ducking, MD  pravastatin (PRAVACHOL) 20 MG tablet Take 20 mg by mouth daily.   Yes [provider]  aspirin EC 81 MG tablet Take 81 mg by mouth daily.    [provider]    ROS:  Out of a complete 14 system review of symptoms, the patient complains only of the following symptoms, and all other reviewed systems are negative.  Cough Insomnia Environmental allergies  Blood pressure (!) 149/80, pulse 60, height 5\' 10"  (1.778 m), weight 218 lb (98.9 kg).  Physical Exam  General: The patient is alert and cooperative at the time of the examination.  Skin: No significant peripheral edema is noted.   Neurologic Exam  Mental status: The patient is alert and oriented x 3 at the time of the examination. The patient has apparent normal recent and remote memory, with an apparently normal attention span and concentration ability.   Cranial nerves: Facial symmetry is present. Speech is normal, no aphasia or dysarthria is noted. Extraocular movements are full.  On primary gaze, there is exotropia  of the right eye.  Visual fields are full.  Motor: The patient has good strength in all 4 extremities.  Sensory examination: Soft touch sensation is symmetric on the face, arms, and legs.  Coordination: The patient has good finger-nose-finger and heel-to-shin bilaterally.  Gait and station: The patient has a normal gait. Tandem gait is normal. Romberg is negative. No drift is seen.  Reflexes: Deep tendon reflexes are symmetric.    MRI brain 01/20/17:  IMPRESSION:  Abnormal MRI brain (with and without) demonstrating: 1. Mild round and ovoid periventricular, subcortical chronic demyelinating plaques. No abnormal lesions are seen on  post contrast views.  2. No change from MRI on 06/24/13.    * MRI scan images were reviewed online. I agree with the written report.  Assessment/Plan:  1.  Multiple sclerosis  2.  Restless leg syndrome  The patient is on Mirapex for the restless legs, he is doing well with this.  He will continue the Copaxone, he gets blood work through his primary care physician, this will be done in several weeks.  The patient will follow-up in 6 months.  Jill Alexanders MD 07/08/2017 7:37 AM  [facility] [address] [place], [place] [address]  Phone [phone] Fax [phone]

## 2017-07-30 DIAGNOSIS — H43812 Vitreous degeneration, left eye: Secondary | ICD-10-CM | POA: Diagnosis not present

## 2017-08-19 ENCOUNTER — Other Ambulatory Visit: Payer: Self-pay | Admitting: Neurology

## 2017-09-02 DIAGNOSIS — H35371 Puckering of macula, right eye: Secondary | ICD-10-CM | POA: Diagnosis not present

## 2017-09-02 DIAGNOSIS — H50331 Intermittent monocular exotropia, right eye: Secondary | ICD-10-CM | POA: Diagnosis not present

## 2017-09-02 DIAGNOSIS — H35411 Lattice degeneration of retina, right eye: Secondary | ICD-10-CM | POA: Diagnosis not present

## 2017-09-02 DIAGNOSIS — H43391 Other vitreous opacities, right eye: Secondary | ICD-10-CM | POA: Diagnosis not present

## 2017-09-06 DIAGNOSIS — W57XXXA Bitten or stung by nonvenomous insect and other nonvenomous arthropods, initial encounter: Secondary | ICD-10-CM | POA: Diagnosis not present

## 2017-09-06 DIAGNOSIS — L0390 Cellulitis, unspecified: Secondary | ICD-10-CM | POA: Diagnosis not present

## 2017-09-20 DIAGNOSIS — R7303 Prediabetes: Secondary | ICD-10-CM | POA: Diagnosis not present

## 2017-09-20 DIAGNOSIS — E039 Hypothyroidism, unspecified: Secondary | ICD-10-CM | POA: Diagnosis not present

## 2017-09-20 DIAGNOSIS — I129 Hypertensive chronic kidney disease with stage 1 through stage 4 chronic kidney disease, or unspecified chronic kidney disease: Secondary | ICD-10-CM | POA: Diagnosis not present

## 2017-09-23 DIAGNOSIS — H2512 Age-related nuclear cataract, left eye: Secondary | ICD-10-CM | POA: Diagnosis not present

## 2017-09-23 DIAGNOSIS — Z961 Presence of intraocular lens: Secondary | ICD-10-CM | POA: Diagnosis not present

## 2017-09-23 DIAGNOSIS — H33051 Total retinal detachment, right eye: Secondary | ICD-10-CM | POA: Diagnosis not present

## 2017-09-23 DIAGNOSIS — H43392 Other vitreous opacities, left eye: Secondary | ICD-10-CM | POA: Diagnosis not present

## 2017-09-27 DIAGNOSIS — E785 Hyperlipidemia, unspecified: Secondary | ICD-10-CM | POA: Diagnosis not present

## 2017-09-27 DIAGNOSIS — Z1212 Encounter for screening for malignant neoplasm of rectum: Secondary | ICD-10-CM | POA: Diagnosis not present

## 2017-09-27 DIAGNOSIS — Z0000 Encounter for general adult medical examination without abnormal findings: Secondary | ICD-10-CM | POA: Diagnosis not present

## 2017-09-27 DIAGNOSIS — R7309 Other abnormal glucose: Secondary | ICD-10-CM | POA: Diagnosis not present

## 2017-09-27 DIAGNOSIS — I10 Essential (primary) hypertension: Secondary | ICD-10-CM | POA: Diagnosis not present

## 2017-09-27 DIAGNOSIS — R3121 Asymptomatic microscopic hematuria: Secondary | ICD-10-CM | POA: Diagnosis not present

## 2017-09-27 DIAGNOSIS — E039 Hypothyroidism, unspecified: Secondary | ICD-10-CM | POA: Diagnosis not present

## 2018-01-10 ENCOUNTER — Encounter: Payer: Self-pay | Admitting: Neurology

## 2018-01-10 ENCOUNTER — Ambulatory Visit: Payer: PPO | Admitting: Neurology

## 2018-01-10 VITALS — BP 133/64 | HR 68 | Ht 70.0 in | Wt 219.0 lb

## 2018-01-10 DIAGNOSIS — G35 Multiple sclerosis: Secondary | ICD-10-CM

## 2018-01-10 NOTE — Progress Notes (Signed)
Reason for visit: Multiple sclerosis  Martin Clark is an 72 y.o. male  History of present illness:  Martin Clark is a 72 year old right-handed white male with a history of multiple sclerosis.  The patient has done quite well on Copaxone, he has had good stability by MRI of the brain.  He gets annual blood work done through his primary care physician, he reports no new symptoms.  He is remaining active, he reports no issues with numbness, weakness, balance changes, or difficulty controlling the bowels or the bladder.  The patient denies any changes in memory or concentration.  He returns for an evaluation.  Past Medical History:  Diagnosis Date  . Dyslipidemia   . History of concussion   . Hypertension   . Hypothyroidism   . Memory disorder   . MS (multiple sclerosis) ([place])   . Obesity   . Restless legs syndrome   . Retinal detachment    right  . RLS (restless legs syndrome) 07/01/2016  . Thyroid cancer [facility])     Past Surgical History:  Procedure Laterality Date  . CATARACT EXTRACTION Right   . renal calculi    . Thyroid cancer resection    . TONSILLECTOMY    . VASECTOMY    . VASECTOMY      Family History  Problem Relation Age of Onset  . Heart failure Mother   . Heart failure Father     Social history:  reports that he has never smoked. He has never used smokeless tobacco. He reports that he drinks alcohol. He reports that he does not use drugs.   No Known Allergies  Medications:  Prior to Admission medications   Medication Sig Start Date End Date Taking? Authorizing Provider  aspirin EC 81 MG tablet Take 81 mg by mouth daily.    [provider]  Glatiramer Acetate 40 MG/ML SOSY Inject 63ml (40mg ) subcutaneously three times a week. 08/19/17   Kathrynn Ducking, MD  levothyroxine (SYNTHROID, LEVOTHROID) 150 MCG tablet Take 150 mcg by mouth daily before breakfast.    [provider]  lisinopril (PRINIVIL,ZESTRIL) 10 MG tablet Take 10 mg by mouth daily.     [provider]  pramipexole (MIRAPEX) 1 MG tablet Take 1 tablet (1 mg total) by mouth at bedtime. 07/01/16   Kathrynn Ducking, MD  pravastatin (PRAVACHOL) 20 MG tablet Take 20 mg by mouth daily.    [provider]    ROS:  Out of a complete 14 system review of symptoms, the patient complains only of the following symptoms, and all other reviewed systems are negative.  Snoring Aching muscles  Blood pressure 133/64, pulse 68, height 5\' 10"  (1.778 m), weight 219 lb (99.3 kg).  Physical Exam  General: The patient is alert and cooperative at the time of the examination.  Skin: No significant peripheral edema is noted.   Neurologic Exam  Mental status: The patient is alert and oriented x 3 at the time of the examination. The patient has apparent normal recent and remote memory, with an apparently normal attention span and concentration ability.  Mini-Mental status examination done today shows a total score 30/30.   Cranial nerves: Facial symmetry is present. Speech is normal, no aphasia or dysarthria is noted. Extraocular movements are full. Visual fields are full.  On primary gaze there is exotropia of the right eye.  Pupils are equal, round, and reactive to light.  Discs are flat bilaterally.  Motor: The patient has good strength  in all 4 extremities.  Sensory examination: Soft touch sensation is symmetric on the face, arms, and legs.  Coordination: The patient has good finger-nose-finger and heel-to-shin bilaterally.  Gait and station: The patient has a normal gait. Tandem gait is normal. Romberg is negative. No drift is seen.  Reflexes: Deep tendon reflexes are symmetric.   MRI brain 01/20/17:  IMPRESSION:  Abnormal MRI brain (with and without) demonstrating: 1. Mild round and ovoid periventricular, subcortical chronic demyelinating plaques. No abnormal lesions are seen on post contrast views.  2. No change from MRI on 06/24/13.   * MRI scan images  were reviewed online. I agree with the written report.    Assessment/Plan:  1.  Multiple sclerosis  The patient is doing well on the Copaxone, he will continue the medication.  He will follow-up in 6 months.  He will call if he has any new issues.  Jill Alexanders MD 01/10/2018 7:10 PM  [facility] [address] [place], [place] [address]  Phone [phone] Fax [phone]

## 2018-01-11 DIAGNOSIS — L237 Allergic contact dermatitis due to plants, except food: Secondary | ICD-10-CM | POA: Diagnosis not present

## 2018-02-23 ENCOUNTER — Telehealth: Payer: Self-pay

## 2018-02-23 NOTE — Telephone Encounter (Signed)
Received FYI from the [employer] stating the pt has been approved for a MS grant effecting 01/11/18-01/11/19.

## 2018-03-24 ENCOUNTER — Encounter: Payer: Self-pay | Admitting: Neurology

## 2018-03-24 DIAGNOSIS — R7309 Other abnormal glucose: Secondary | ICD-10-CM | POA: Diagnosis not present

## 2018-03-24 DIAGNOSIS — E785 Hyperlipidemia, unspecified: Secondary | ICD-10-CM | POA: Diagnosis not present

## 2018-03-31 ENCOUNTER — Telehealth: Payer: Self-pay | Admitting: Neurology

## 2018-03-31 DIAGNOSIS — I129 Hypertensive chronic kidney disease with stage 1 through stage 4 chronic kidney disease, or unspecified chronic kidney disease: Secondary | ICD-10-CM | POA: Diagnosis not present

## 2018-03-31 DIAGNOSIS — E785 Hyperlipidemia, unspecified: Secondary | ICD-10-CM | POA: Diagnosis not present

## 2018-03-31 DIAGNOSIS — R7309 Other abnormal glucose: Secondary | ICD-10-CM | POA: Diagnosis not present

## 2018-03-31 DIAGNOSIS — E039 Hypothyroidism, unspecified: Secondary | ICD-10-CM | POA: Diagnosis not present

## 2018-03-31 NOTE — Telephone Encounter (Signed)
I received blood work results from the primary care physician done on 24 March 2018.  Glucose is 100, albumin of 4.4, liver profile is unremarkable, BUN 25, calcium 9.7, CO2 27, chloride of 94, creatinine of 1.08.  Total protein 6.7, sodium 141, potassium 3.7, hemoglobin A1c of 6.1.  PSA of 4.6.

## 2018-04-15 DIAGNOSIS — Z1212 Encounter for screening for malignant neoplasm of rectum: Secondary | ICD-10-CM | POA: Diagnosis not present

## 2018-04-15 DIAGNOSIS — Z1211 Encounter for screening for malignant neoplasm of colon: Secondary | ICD-10-CM | POA: Diagnosis not present

## 2018-07-25 ENCOUNTER — Telehealth: Payer: Self-pay

## 2018-07-25 NOTE — Telephone Encounter (Signed)
I reached out to the pt and discussed his 07/29/18 appt.  Pt was adivsed Due to current COVID 19 pandemic, our office is severely reducing in office visits until further notice, in order to minimize the risk to our patients and healthcare providers.   Pt was offered a mychart video visit for 07/29/18 and consented.   Pt understands that although there may be some limitations with this type of visit, we will take all precautions to reduce any security or privacy concerns.  Pt understands that this will be treated like an in office visit and we will file with pt's insurance, and there may be a patient responsible charge related to this service.  Link has been sent for visit and chart has been updated.

## 2018-07-29 ENCOUNTER — Telehealth: Payer: Self-pay | Admitting: Neurology

## 2018-08-01 ENCOUNTER — Encounter: Payer: Self-pay | Admitting: Neurology

## 2018-08-01 ENCOUNTER — Telehealth (INDEPENDENT_AMBULATORY_CARE_PROVIDER_SITE_OTHER): Payer: PPO | Admitting: Neurology

## 2018-08-01 ENCOUNTER — Other Ambulatory Visit: Payer: Self-pay

## 2018-08-01 DIAGNOSIS — R413 Other amnesia: Secondary | ICD-10-CM | POA: Diagnosis not present

## 2018-08-01 DIAGNOSIS — G35 Multiple sclerosis: Secondary | ICD-10-CM

## 2018-08-01 NOTE — Progress Notes (Signed)
      Virtual Visit via Telephone Note  I connected with [name] on 08/01/18 at  7:30 AM EDT by telephone and verified that I am speaking with the correct person using two identifiers.  Location: Patient: The patient is at home. Provider: The physician is in office.   I discussed the limitations, risks, security and privacy concerns of performing an evaluation and management service by telephone and the availability of in person appointments. I also discussed with the patient that there may be a patient responsible charge related to this service. The patient expressed understanding and agreed to proceed.   History of Present Illness: Martin Clark is a 73 year old right-handed white male with a history of multiple sclerosis.  The patient has been on Copaxone for a number of years and is tolerating the medication well.  He uses the generic form of the drug.  He has prediabetes, he has been able to lose weight by changing his diet and exercising.  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  He remains active.  The heat does bother him, he frequently has to go inside to cool off while working.  He denies any new numbness, weakness, balance changes or troubles with vision.  His memory has been stable, he feels that his mentation has improved over time.  He in general feels better.  He does get blood work done frequently through his primary care physician.   Observations/Objective: We were unable to contact the patient via video, the patient was not receiving emails, cannot connect on my chart video.  On the telephone visit therefore, the patient appeared to have normal speech pattern, no aphasia or dysarthria.  He was alert and cooperative, responding to questions appropriately.   MRI brain 01/19/17:  IMPRESSION:  Abnormal MRI brain (with and without) demonstrating: 1. Mild round and ovoid periventricular, subcortical chronic demyelinating plaques. No abnormal lesions are seen on post  contrast views.   2. No change from MRI on 06/24/13.   * MRI scan images were reviewed online. I agree with the written report.   Assessment and Plan: 1.  Multiple sclerosis  2.  Mild memory disturbance  The patient is doing quite well at this time.  He will remain on Copaxone, we will check MRI evaluation of the brain approximately every 3 to 4 years.  His last study was December 2018.  The patient will follow-up on an annual basis.  Follow Up Instructions:    I discussed the assessment and treatment plan with the patient. The patient was provided an opportunity to ask questions and all were answered. The patient agreed with the plan and demonstrated an understanding of the instructions.   The patient was advised to call back or seek an in-person evaluation if the symptoms worsen or if the condition fails to improve as anticipated.  I provided 15 minutes of non-face-to-face time during this encounter.   Kathrynn Ducking, MD

## 2018-08-02 ENCOUNTER — Other Ambulatory Visit: Payer: Self-pay

## 2018-09-08 DIAGNOSIS — H35371 Puckering of macula, right eye: Secondary | ICD-10-CM | POA: Diagnosis not present

## 2018-09-08 DIAGNOSIS — H33011 Retinal detachment with single break, right eye: Secondary | ICD-10-CM | POA: Diagnosis not present

## 2018-09-08 DIAGNOSIS — H33059 Total retinal detachment, unspecified eye: Secondary | ICD-10-CM | POA: Diagnosis not present

## 2018-09-08 DIAGNOSIS — H50331 Intermittent monocular exotropia, right eye: Secondary | ICD-10-CM | POA: Diagnosis not present

## 2018-09-27 DIAGNOSIS — H2512 Age-related nuclear cataract, left eye: Secondary | ICD-10-CM | POA: Diagnosis not present

## 2018-09-27 DIAGNOSIS — Z961 Presence of intraocular lens: Secondary | ICD-10-CM | POA: Diagnosis not present

## 2018-09-27 DIAGNOSIS — E785 Hyperlipidemia, unspecified: Secondary | ICD-10-CM | POA: Diagnosis not present

## 2018-09-27 DIAGNOSIS — H43392 Other vitreous opacities, left eye: Secondary | ICD-10-CM | POA: Diagnosis not present

## 2018-09-27 DIAGNOSIS — N182 Chronic kidney disease, stage 2 (mild): Secondary | ICD-10-CM | POA: Diagnosis not present

## 2018-09-27 DIAGNOSIS — H33051 Total retinal detachment, right eye: Secondary | ICD-10-CM | POA: Diagnosis not present

## 2018-09-27 DIAGNOSIS — Z125 Encounter for screening for malignant neoplasm of prostate: Secondary | ICD-10-CM | POA: Diagnosis not present

## 2018-09-27 DIAGNOSIS — R7309 Other abnormal glucose: Secondary | ICD-10-CM | POA: Diagnosis not present

## 2018-09-27 DIAGNOSIS — Z0000 Encounter for general adult medical examination without abnormal findings: Secondary | ICD-10-CM | POA: Diagnosis not present

## 2018-10-04 DIAGNOSIS — Z23 Encounter for immunization: Secondary | ICD-10-CM | POA: Diagnosis not present

## 2018-10-04 DIAGNOSIS — Z0000 Encounter for general adult medical examination without abnormal findings: Secondary | ICD-10-CM | POA: Diagnosis not present

## 2018-10-04 DIAGNOSIS — G35 Multiple sclerosis: Secondary | ICD-10-CM | POA: Diagnosis not present

## 2018-10-04 DIAGNOSIS — E7800 Pure hypercholesterolemia, unspecified: Secondary | ICD-10-CM | POA: Diagnosis not present

## 2018-10-04 DIAGNOSIS — I10 Essential (primary) hypertension: Secondary | ICD-10-CM | POA: Diagnosis not present

## 2019-03-28 DIAGNOSIS — R7309 Other abnormal glucose: Secondary | ICD-10-CM | POA: Diagnosis not present

## 2019-03-28 DIAGNOSIS — R7303 Prediabetes: Secondary | ICD-10-CM | POA: Diagnosis not present

## 2019-03-28 DIAGNOSIS — E039 Hypothyroidism, unspecified: Secondary | ICD-10-CM | POA: Diagnosis not present

## 2019-03-28 DIAGNOSIS — E785 Hyperlipidemia, unspecified: Secondary | ICD-10-CM | POA: Diagnosis not present

## 2019-03-28 DIAGNOSIS — I129 Hypertensive chronic kidney disease with stage 1 through stage 4 chronic kidney disease, or unspecified chronic kidney disease: Secondary | ICD-10-CM | POA: Diagnosis not present

## 2019-03-28 DIAGNOSIS — N182 Chronic kidney disease, stage 2 (mild): Secondary | ICD-10-CM | POA: Diagnosis not present

## 2019-04-04 DIAGNOSIS — E785 Hyperlipidemia, unspecified: Secondary | ICD-10-CM | POA: Diagnosis not present

## 2019-04-04 DIAGNOSIS — E039 Hypothyroidism, unspecified: Secondary | ICD-10-CM | POA: Diagnosis not present

## 2019-04-04 DIAGNOSIS — I10 Essential (primary) hypertension: Secondary | ICD-10-CM | POA: Diagnosis not present

## 2019-04-04 DIAGNOSIS — R972 Elevated prostate specific antigen [PSA]: Secondary | ICD-10-CM | POA: Diagnosis not present

## 2019-04-04 DIAGNOSIS — M1711 Unilateral primary osteoarthritis, right knee: Secondary | ICD-10-CM | POA: Diagnosis not present

## 2019-04-04 DIAGNOSIS — M25561 Pain in right knee: Secondary | ICD-10-CM | POA: Diagnosis not present

## 2019-05-15 DIAGNOSIS — M1711 Unilateral primary osteoarthritis, right knee: Secondary | ICD-10-CM | POA: Diagnosis not present

## 2019-05-15 DIAGNOSIS — M1712 Unilateral primary osteoarthritis, left knee: Secondary | ICD-10-CM | POA: Diagnosis not present

## 2019-08-08 ENCOUNTER — Other Ambulatory Visit: Payer: Self-pay | Admitting: Neurology

## 2019-08-09 ENCOUNTER — Encounter: Payer: Self-pay | Admitting: Neurology

## 2019-08-09 ENCOUNTER — Ambulatory Visit: Payer: PPO | Admitting: Neurology

## 2019-08-09 ENCOUNTER — Other Ambulatory Visit: Payer: Self-pay

## 2019-08-09 VITALS — BP 131/77 | HR 62 | Ht 70.0 in | Wt 221.5 lb

## 2019-08-09 DIAGNOSIS — G35 Multiple sclerosis: Secondary | ICD-10-CM | POA: Diagnosis not present

## 2019-08-09 DIAGNOSIS — G2581 Restless legs syndrome: Secondary | ICD-10-CM | POA: Diagnosis not present

## 2019-08-09 NOTE — Progress Notes (Signed)
Reason for visit: Multiple sclerosis  Martin Clark is an 74 y.o. male  History of present illness:  Martin Clark is a 74 year old right-handed white male with a history of multiple sclerosis.  The patient has been on Copaxone for a number of years and has been very stable on this medication, he tolerates the drug well.  The patient denies any new numbness, weakness, balance problems, vision changes, or bowel or bladder issues.  The patient stays active.  He has had some right knee arthritis and is seeing an orthopedic surgeon for this.  He denies any other significant new medical issues that have come up since last seen.  His last MRI of the brain was done in 2018.  Past Medical History:  Diagnosis Date  . Dyslipidemia   . History of concussion   . Hypertension   . Hypothyroidism   . Memory disorder   . MS (multiple sclerosis) ([place])   . Obesity   . Restless legs syndrome   . Retinal detachment    right  . RLS (restless legs syndrome) 07/01/2016  . Thyroid cancer [facility])     Past Surgical History:  Procedure Laterality Date  . CATARACT EXTRACTION Right   . renal calculi    . Thyroid cancer resection    . TONSILLECTOMY    . VASECTOMY    . VASECTOMY      Family History  Problem Relation Age of Onset  . Heart failure Mother   . Heart failure Father     Social history:  reports that he has never smoked. He has never used smokeless tobacco. He reports current alcohol use. He reports that he does not use drugs.   No Known Allergies  Medications:  Prior to Admission medications   Medication Sig Start Date End Date Taking? Authorizing Provider  aspirin EC 81 MG tablet Take 81 mg by mouth daily.   Yes [provider]  Glatiramer Acetate 40 MG/ML SOSY Inject 4ml (40mg ) subcutaneously three times a week. 08/09/19  Yes Kathrynn Ducking, MD  levothyroxine (SYNTHROID, LEVOTHROID) 150 MCG tablet Take 150 mcg by mouth daily before breakfast.   Yes [provider]    lisinopril-hydrochlorothiazide (ZESTORETIC) 10-12.5 MG tablet Take 1 tablet by mouth daily. 06/12/19  Yes [provider]  meloxicam (MOBIC) 15 MG tablet Take 15 mg by mouth daily.   Yes [provider]  OVER THE COUNTER MEDICATION flexuran take daily   Yes [provider]  pramipexole (MIRAPEX) 1 MG tablet Take 1 tablet (1 mg total) by mouth at bedtime. 07/01/16  Yes Kathrynn Ducking, MD  rosuvastatin (CRESTOR) 10 MG tablet Take 10 mg by mouth daily. 06/30/19  Yes [provider]    ROS:  Out of a complete 14 system review of symptoms, the patient complains only of the following symptoms, and all other reviewed systems are negative.  Arthritis   Blood pressure 131/77, pulse 62, height 5\' 10"  (1.778 m), weight 221 lb 8 oz (100.5 kg).  Physical Exam  General: The patient is alert and cooperative at the time of the examination.  Skin: No significant peripheral edema is noted.   Neurologic Exam  Mental status: The patient is alert and oriented x 3 at the time of the examination. The patient has apparent normal recent and remote memory, with an apparently normal attention span and concentration ability.   Cranial nerves: Facial symmetry is present. Speech is normal, no aphasia or dysarthria is noted. Extraocular movements  are full. Visual fields are full.  On primary gaze there is exotropia of the right eye.  Pupils are equal, round, and reactive to light.  Discs are flat bilaterally.  Motor: The patient has good strength in all 4 extremities.  Sensory examination: Soft touch sensation is symmetric on the face, arms, and legs.  Coordination: The patient has good finger-nose-finger and heel-to-shin bilaterally.  Gait and station: The patient has a normal gait. Tandem gait is minimally unsteady. Romberg is negative. No drift is seen.  Reflexes: Deep tendon reflexes are symmetric.   Assessment/Plan:  1.  Multiple sclerosis  The patient is  stable on Copaxone, we will continue the medication.  He gets annual blood work done through his primary care physician.  He will follow-up in 1 year, sooner if needed.  On the next visit we will repeat MRI of the brain.  Jill Alexanders MD 08/09/2019 10:28 AM  [facility] [address] [place], [place] [address]  Phone [phone] Fax [phone]

## 2019-08-15 ENCOUNTER — Other Ambulatory Visit: Payer: Self-pay | Admitting: Neurology

## 2019-09-14 ENCOUNTER — Ambulatory Visit (INDEPENDENT_AMBULATORY_CARE_PROVIDER_SITE_OTHER): Payer: PPO | Admitting: Ophthalmology

## 2019-09-14 ENCOUNTER — Encounter (INDEPENDENT_AMBULATORY_CARE_PROVIDER_SITE_OTHER): Payer: Self-pay | Admitting: Ophthalmology

## 2019-09-14 ENCOUNTER — Other Ambulatory Visit: Payer: Self-pay

## 2019-09-14 DIAGNOSIS — H50331 Intermittent monocular exotropia, right eye: Secondary | ICD-10-CM | POA: Diagnosis not present

## 2019-09-14 DIAGNOSIS — H35371 Puckering of macula, right eye: Secondary | ICD-10-CM | POA: Insufficient documentation

## 2019-09-14 DIAGNOSIS — H33059 Total retinal detachment, unspecified eye: Secondary | ICD-10-CM | POA: Insufficient documentation

## 2019-09-14 DIAGNOSIS — H33051 Total retinal detachment, right eye: Secondary | ICD-10-CM

## 2019-09-14 NOTE — Assessment & Plan Note (Signed)

## 2019-09-14 NOTE — Progress Notes (Addendum)
09/14/2019     CHIEF COMPLAINT Patient presents for Retina Follow Up   HISTORY OF PRESENT ILLNESS: Martin Clark is a 74 y.o. male who presents to the clinic today for:   HPI    Retina Follow Up    Patient presents with  Other.  In both eyes.  This started 1 year ago.  Severity is mild.  Duration of 1 year.  Since onset it is stable.          Comments    1 Year F/U OU  Pt denies noticeable changes to [place] OU since last visit. Pt denies ocular pain, flashes of light, or floaters OU.         Last edited by Rockie Neighbours, [place] on 09/14/2019  1:40 PM. (History)      Referring physician: Izak Pretty, MD [place],  [place] [address]  HISTORICAL INFORMATION:   Selected notes from the [place]: No current outpatient medications on file. (Ophthalmic Drugs)   No current facility-administered medications for this visit. (Ophthalmic Drugs)   Current Outpatient Medications (Other)  Medication Sig  . aspirin EC 81 MG tablet Take 81 mg by mouth daily.  . Glatiramer Acetate 40 MG/ML SOSY Inject 43ml (40mg ) subcutaneously three times a week.  . levothyroxine (SYNTHROID, LEVOTHROID) 150 MCG tablet Take 150 mcg by mouth daily before breakfast.  . lisinopril-hydrochlorothiazide (ZESTORETIC) 10-12.5 MG tablet Take 1 tablet by mouth daily.  . meloxicam (MOBIC) 15 MG tablet Take 15 mg by mouth daily.  Marland Kitchen OVER THE COUNTER MEDICATION flexuran take daily  . pramipexole (MIRAPEX) 1 MG tablet Take 1 tablet (1 mg total) by mouth at bedtime.  . rosuvastatin (CRESTOR) 10 MG tablet Take 10 mg by mouth daily.   No current facility-administered medications for this visit. (Other)      REVIEW OF SYSTEMS:    ALLERGIES No Known Allergies  PAST MEDICAL HISTORY Past Medical History:  Diagnosis Date  . Dyslipidemia   . History of concussion   . Hypertension   . Hypothyroidism   . Memory disorder   . MS (multiple sclerosis)  ([place])   . Obesity   . Restless legs syndrome   . Retinal detachment    right  . RLS (restless legs syndrome) 07/01/2016  . Thyroid cancer [facility])    Past Surgical History:  Procedure Laterality Date  . CATARACT EXTRACTION Right   . renal calculi    . Thyroid cancer resection    . TONSILLECTOMY    . VASECTOMY    . VASECTOMY      FAMILY HISTORY Family History  Problem Relation Age of Onset  . Heart failure Mother   . Heart failure Father     SOCIAL HISTORY Social History   Tobacco Use  . Smoking status: Never Smoker  . Smokeless tobacco: Never Used  Substance Use Topics  . Alcohol use: Yes    Alcohol/week: 0.0 standard drinks    Comment: monthly  . Drug use: No         OPHTHALMIC EXAM:  Base Eye Exam    Visual Acuity (ETDRS)      Right Left   Dist cc 20/30 +2 20/20   Dist ph cc NI    Correction: Glasses       Tonometry (Tonopen, 1:47 PM)      Right Left   Pressure 10 08       Pupils  Pupils Dark Light Shape React APD   Right PERRL 4 3 Round Brisk None   Left PERRL 4 3 Round Brisk None       Visual Fields (Counting fingers)      Left Right    Full Full       Extraocular Movement   XT OD       Neuro/Psych    Oriented x3: Yes   Mood/Affect: Normal       Dilation    Both eyes: 1.0% Mydriacyl, 2.5% Phenylephrine @ 1:47 PM        Slit Lamp and Fundus Exam    External Exam      Right Left   External Normal Normal       Slit Lamp Exam      Right Left   Lids/Lashes Normal Normal   Conjunctiva/Sclera White and quiet White and quiet   Cornea Clear Clear   Anterior Chamber Deep and quiet Deep and quiet   Iris Round and reactive Round and reactive   Lens Centered posterior chamber intraocular lens Centered posterior chamber intraocular lens   Anterior Vitreous Normal Normal       Fundus Exam      Right Left   Posterior Vitreous Posterior vitreous detachment Posterior vitreous detachment   Disc Normal Normal   C/D Ratio 0.5 0.5     Macula Normal Normal   Vessels Normal Normal   Periphery Scleral buckle, no retinal holes or breaks Pavingstone degeneration          IMAGING AND PROCEDURES  Imaging and Procedures for 09/14/19  OCT, Retina - OU - Both Eyes       Right Eye Scan locations included subfoveal. Central Foveal Thickness: 280. Progression has improved. Findings include normal foveal contour, retinal drusen , normal observations, epiretinal membrane.   Left Eye Quality was good. Scan locations included subfoveal. Central Foveal Thickness: 304. Progression has improved. Findings include normal observations, normal foveal contour, retinal drusen .                 ASSESSMENT/PLAN:  Right epiretinal membrane The nature of macular pucker (epiretinal membrane ERM) was discussed with the patient as well as threshold criteria for vitrectomy surgery. I explained that in rare cases another surgery is needed to actually remove a second wrinkle should it regrow.  Most often, the epiretinal membrane and underlying wrinkled internal limiting membrane are removed with the first surgery, to accomplish the goals.   If the operative eye is [place] (natural lens still present), cataract surgery is often recommended prior to Vitrectomy. This will enable the retina surgeon to have the best view during surgery and the patient to obtain optimal results in the future. Treatment options were discussed.      ICD-10-CM   1. Right epiretinal membrane  H35.371 OCT, Retina - OU - Both Eyes  2. 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 Intermittent monocular exotropia of right eye  H50.331     1. Continue to observe the right eye, as there is no topographic distortion. Detachment in the right eye, initially with low-grade PVR has subsided and no ongoing changes in condition  2.  3.  Ophthalmic Meds Ordered this visit:  No orders of the defined types were placed in this encounter.      Return for COLOR  FP, DILATE OU, OCT.  There are no Patient Instructions on file for this visit.   Explained the diagnoses, plan,  and follow up with the patient and they expressed understanding.  Patient expressed understanding of the importance of proper follow up care.   Clent Demark Manan Olmo M.D. Diseases & Surgery of the Retina and Vitreous [facility] [place] 09/14/19     Abbreviations: M myopia (nearsighted); A astigmatism; H hyperopia (farsighted); P presbyopia; Mrx spectacle prescription;  CTL contact lenses; OD right eye; OS left eye; OU both eyes  XT exotropia; ET esotropia; PEK punctate epithelial keratitis; PEE punctate epithelial erosions; DES dry eye syndrome; MGD meibomian gland dysfunction; ATs artificial tears; PFAT's preservative free artificial tears; [place] nuclear sclerotic cataract; PSC posterior subcapsular cataract; ERM epi-retinal membrane; PVD posterior vitreous detachment; RD retinal detachment; DM diabetes mellitus; DR diabetic retinopathy; NPDR non-proliferative diabetic retinopathy; PDR proliferative diabetic retinopathy; CSME clinically significant macular edema; DME diabetic macular edema; dbh dot blot hemorrhages; CWS cotton wool spot; POAG primary open angle glaucoma; C/D cup-to-disc ratio; HVF humphrey visual field; GVF goldmann visual field; OCT optical coherence tomography; IOP intraocular pressure; BRVO Branch retinal vein occlusion; CRVO central retinal vein occlusion; CRAO central retinal artery occlusion; BRAO branch retinal artery occlusion; RT retinal tear; SB scleral buckle; PPV pars plana vitrectomy; VH Vitreous hemorrhage; PRP panretinal laser photocoagulation; IVK intravitreal kenalog; VMT vitreomacular traction; MH Macular hole;  NVD neovascularization of the disc; NVE neovascularization elsewhere; AREDS age related eye disease study; ARMD age related macular degeneration; POAG primary open angle glaucoma; EBMD epithelial/anterior basement membrane dystrophy; ACIOL  anterior chamber intraocular lens; IOL intraocular lens; PCIOL posterior chamber intraocular lens; Phaco/IOL phacoemulsification with intraocular lens placement; [place] photorefractive keratectomy; LASIK laser assisted in situ keratomileusis; HTN hypertension; DM diabetes mellitus; COPD chronic obstructive pulmonary disease

## 2019-10-04 DIAGNOSIS — E785 Hyperlipidemia, unspecified: Secondary | ICD-10-CM | POA: Diagnosis not present

## 2019-10-04 DIAGNOSIS — E559 Vitamin D deficiency, unspecified: Secondary | ICD-10-CM | POA: Diagnosis not present

## 2019-10-04 DIAGNOSIS — E039 Hypothyroidism, unspecified: Secondary | ICD-10-CM | POA: Diagnosis not present

## 2019-10-04 DIAGNOSIS — I10 Essential (primary) hypertension: Secondary | ICD-10-CM | POA: Diagnosis not present

## 2019-10-11 DIAGNOSIS — E039 Hypothyroidism, unspecified: Secondary | ICD-10-CM | POA: Diagnosis not present

## 2019-10-11 DIAGNOSIS — Z0000 Encounter for general adult medical examination without abnormal findings: Secondary | ICD-10-CM | POA: Diagnosis not present

## 2019-10-11 DIAGNOSIS — N182 Chronic kidney disease, stage 2 (mild): Secondary | ICD-10-CM | POA: Diagnosis not present

## 2019-10-11 DIAGNOSIS — E785 Hyperlipidemia, unspecified: Secondary | ICD-10-CM | POA: Diagnosis not present

## 2019-10-11 DIAGNOSIS — E559 Vitamin D deficiency, unspecified: Secondary | ICD-10-CM | POA: Diagnosis not present

## 2019-10-11 DIAGNOSIS — I10 Essential (primary) hypertension: Secondary | ICD-10-CM | POA: Diagnosis not present

## 2019-10-11 DIAGNOSIS — R7309 Other abnormal glucose: Secondary | ICD-10-CM | POA: Diagnosis not present

## 2019-10-18 DIAGNOSIS — H43811 Vitreous degeneration, right eye: Secondary | ICD-10-CM | POA: Diagnosis not present

## 2019-10-18 DIAGNOSIS — Z961 Presence of intraocular lens: Secondary | ICD-10-CM | POA: Diagnosis not present

## 2019-10-18 DIAGNOSIS — Z9889 Other specified postprocedural states: Secondary | ICD-10-CM | POA: Diagnosis not present

## 2019-10-18 DIAGNOSIS — H35412 Lattice degeneration of retina, left eye: Secondary | ICD-10-CM | POA: Diagnosis not present

## 2019-10-18 DIAGNOSIS — H43392 Other vitreous opacities, left eye: Secondary | ICD-10-CM | POA: Diagnosis not present

## 2019-10-18 DIAGNOSIS — H2512 Age-related nuclear cataract, left eye: Secondary | ICD-10-CM | POA: Diagnosis not present

## 2020-03-14 ENCOUNTER — Telehealth: Payer: Self-pay | Admitting: Neurology

## 2020-03-14 NOTE — Telephone Encounter (Signed)
Pt called, have been calling around, there is not a foundation open yet that can help with my Glatiramer prescription. Can you prescribe another medication. Would like a call from the nurse.

## 2020-03-14 NOTE — Telephone Encounter (Signed)
I called the patient, he no longer has any foundation money to help pay for his medication.  His insurance does not cover a good portion of the cost of the drug.  I will get a revisit to discuss other treatments, we may consider [name].

## 2020-03-18 NOTE — Telephone Encounter (Signed)
Spoke to patient this morning, offered him 3/31 @ 0730.  Patient accepted.  Aware to arrive by 0700. Patient denied further questions, verbalized understanding and expressed appreciation for the phone call.

## 2020-04-10 DIAGNOSIS — E785 Hyperlipidemia, unspecified: Secondary | ICD-10-CM | POA: Diagnosis not present

## 2020-04-10 DIAGNOSIS — E039 Hypothyroidism, unspecified: Secondary | ICD-10-CM | POA: Diagnosis not present

## 2020-04-10 DIAGNOSIS — E559 Vitamin D deficiency, unspecified: Secondary | ICD-10-CM | POA: Diagnosis not present

## 2020-04-17 DIAGNOSIS — I10 Essential (primary) hypertension: Secondary | ICD-10-CM | POA: Diagnosis not present

## 2020-04-17 DIAGNOSIS — E039 Hypothyroidism, unspecified: Secondary | ICD-10-CM | POA: Diagnosis not present

## 2020-04-17 DIAGNOSIS — E785 Hyperlipidemia, unspecified: Secondary | ICD-10-CM | POA: Diagnosis not present

## 2020-05-02 ENCOUNTER — Emergency Department (HOSPITAL_COMMUNITY): Payer: PPO

## 2020-05-02 ENCOUNTER — Ambulatory Visit: Payer: Self-pay | Admitting: Neurology

## 2020-05-02 ENCOUNTER — Other Ambulatory Visit: Payer: Self-pay

## 2020-05-02 ENCOUNTER — Encounter (HOSPITAL_COMMUNITY): Payer: Self-pay | Admitting: Emergency Medicine

## 2020-05-02 ENCOUNTER — Telehealth: Payer: Self-pay | Admitting: Neurology

## 2020-05-02 ENCOUNTER — Inpatient Hospital Stay (HOSPITAL_COMMUNITY)
Admission: EM | Admit: 2020-05-02 | Discharge: 2020-05-06 | DRG: 551 | Disposition: A | Discharge status: Home Health | Payer: PPO | Attending: Internal Medicine | Admitting: Internal Medicine

## 2020-05-02 DIAGNOSIS — M4644 Discitis, unspecified, thoracic region: Principal | ICD-10-CM | POA: Diagnosis present

## 2020-05-02 DIAGNOSIS — M47812 Spondylosis without myelopathy or radiculopathy, cervical region: Secondary | ICD-10-CM | POA: Diagnosis not present

## 2020-05-02 DIAGNOSIS — R404 Transient alteration of awareness: Secondary | ICD-10-CM | POA: Diagnosis not present

## 2020-05-02 DIAGNOSIS — M4643 Discitis, unspecified, cervicothoracic region: Secondary | ICD-10-CM | POA: Diagnosis not present

## 2020-05-02 DIAGNOSIS — Z20822 Contact with and (suspected) exposure to covid-19: Secondary | ICD-10-CM | POA: Diagnosis not present

## 2020-05-02 DIAGNOSIS — E785 Hyperlipidemia, unspecified: Secondary | ICD-10-CM | POA: Diagnosis present

## 2020-05-02 DIAGNOSIS — M4640 Discitis, unspecified, site unspecified: Secondary | ICD-10-CM | POA: Diagnosis not present

## 2020-05-02 DIAGNOSIS — E890 Postprocedural hypothyroidism: Secondary | ICD-10-CM | POA: Diagnosis present

## 2020-05-02 DIAGNOSIS — M47813 Spondylosis without myelopathy or radiculopathy, cervicothoracic region: Secondary | ICD-10-CM | POA: Diagnosis not present

## 2020-05-02 DIAGNOSIS — E876 Hypokalemia: Secondary | ICD-10-CM | POA: Diagnosis not present

## 2020-05-02 DIAGNOSIS — Z85850 Personal history of malignant neoplasm of thyroid: Secondary | ICD-10-CM | POA: Diagnosis not present

## 2020-05-02 DIAGNOSIS — E871 Hypo-osmolality and hyponatremia: Secondary | ICD-10-CM | POA: Diagnosis not present

## 2020-05-02 DIAGNOSIS — R310 Gross hematuria: Secondary | ICD-10-CM | POA: Diagnosis present

## 2020-05-02 DIAGNOSIS — G2581 Restless legs syndrome: Secondary | ICD-10-CM | POA: Diagnosis present

## 2020-05-02 DIAGNOSIS — D638 Anemia in other chronic diseases classified elsewhere: Secondary | ICD-10-CM | POA: Diagnosis not present

## 2020-05-02 DIAGNOSIS — R059 Cough, unspecified: Secondary | ICD-10-CM | POA: Diagnosis not present

## 2020-05-02 DIAGNOSIS — M4623 Osteomyelitis of vertebra, cervicothoracic region: Secondary | ICD-10-CM | POA: Diagnosis not present

## 2020-05-02 DIAGNOSIS — R739 Hyperglycemia, unspecified: Secondary | ICD-10-CM | POA: Diagnosis not present

## 2020-05-02 DIAGNOSIS — R319 Hematuria, unspecified: Secondary | ICD-10-CM | POA: Diagnosis present

## 2020-05-02 DIAGNOSIS — R410 Disorientation, unspecified: Secondary | ICD-10-CM | POA: Diagnosis not present

## 2020-05-02 DIAGNOSIS — B9561 Methicillin susceptible Staphylococcus aureus infection as the cause of diseases classified elsewhere: Secondary | ICD-10-CM | POA: Diagnosis not present

## 2020-05-02 DIAGNOSIS — G9341 Metabolic encephalopathy: Secondary | ICD-10-CM | POA: Diagnosis present

## 2020-05-02 DIAGNOSIS — I10 Essential (primary) hypertension: Secondary | ICD-10-CM | POA: Diagnosis present

## 2020-05-02 DIAGNOSIS — N179 Acute kidney failure, unspecified: Secondary | ICD-10-CM | POA: Diagnosis present

## 2020-05-02 DIAGNOSIS — A419 Sepsis, unspecified organism: Secondary | ICD-10-CM | POA: Diagnosis not present

## 2020-05-02 DIAGNOSIS — R509 Fever, unspecified: Secondary | ICD-10-CM | POA: Diagnosis not present

## 2020-05-02 DIAGNOSIS — M4624 Osteomyelitis of vertebra, thoracic region: Secondary | ICD-10-CM | POA: Diagnosis present

## 2020-05-02 DIAGNOSIS — E669 Obesity, unspecified: Secondary | ICD-10-CM | POA: Diagnosis present

## 2020-05-02 DIAGNOSIS — M5023 Other cervical disc displacement, cervicothoracic region: Secondary | ICD-10-CM | POA: Diagnosis not present

## 2020-05-02 DIAGNOSIS — I499 Cardiac arrhythmia, unspecified: Secondary | ICD-10-CM | POA: Diagnosis not present

## 2020-05-02 DIAGNOSIS — R350 Frequency of micturition: Secondary | ICD-10-CM | POA: Diagnosis not present

## 2020-05-02 DIAGNOSIS — G9519 Other vascular myelopathies: Secondary | ICD-10-CM | POA: Diagnosis not present

## 2020-05-02 DIAGNOSIS — I4510 Unspecified right bundle-branch block: Secondary | ICD-10-CM | POA: Diagnosis not present

## 2020-05-02 DIAGNOSIS — G35 Multiple sclerosis: Secondary | ICD-10-CM | POA: Diagnosis present

## 2020-05-02 DIAGNOSIS — Z79890 Hormone replacement therapy (postmenopausal): Secondary | ICD-10-CM

## 2020-05-02 DIAGNOSIS — R0902 Hypoxemia: Secondary | ICD-10-CM | POA: Diagnosis not present

## 2020-05-02 DIAGNOSIS — M5124 Other intervertebral disc displacement, thoracic region: Secondary | ICD-10-CM | POA: Diagnosis not present

## 2020-05-02 DIAGNOSIS — K4090 Unilateral inguinal hernia, without obstruction or gangrene, not specified as recurrent: Secondary | ICD-10-CM | POA: Diagnosis not present

## 2020-05-02 DIAGNOSIS — M4622 Osteomyelitis of vertebra, cervical region: Secondary | ICD-10-CM | POA: Diagnosis not present

## 2020-05-02 DIAGNOSIS — Z6832 Body mass index (BMI) 32.0-32.9, adult: Secondary | ICD-10-CM | POA: Diagnosis not present

## 2020-05-02 DIAGNOSIS — R7881 Bacteremia: Secondary | ICD-10-CM | POA: Diagnosis not present

## 2020-05-02 DIAGNOSIS — M4804 Spinal stenosis, thoracic region: Secondary | ICD-10-CM | POA: Diagnosis not present

## 2020-05-02 DIAGNOSIS — I700 Atherosclerosis of aorta: Secondary | ICD-10-CM | POA: Diagnosis not present

## 2020-05-02 DIAGNOSIS — I959 Hypotension, unspecified: Secondary | ICD-10-CM | POA: Diagnosis not present

## 2020-05-02 DIAGNOSIS — J9811 Atelectasis: Secondary | ICD-10-CM | POA: Diagnosis not present

## 2020-05-02 DIAGNOSIS — E039 Hypothyroidism, unspecified: Secondary | ICD-10-CM | POA: Diagnosis not present

## 2020-05-02 DIAGNOSIS — Z87820 Personal history of traumatic brain injury: Secondary | ICD-10-CM | POA: Diagnosis not present

## 2020-05-02 DIAGNOSIS — Z8709 Personal history of other diseases of the respiratory system: Secondary | ICD-10-CM | POA: Diagnosis not present

## 2020-05-02 DIAGNOSIS — M48061 Spinal stenosis, lumbar region without neurogenic claudication: Secondary | ICD-10-CM | POA: Diagnosis not present

## 2020-05-02 DIAGNOSIS — M4642 Discitis, unspecified, cervical region: Secondary | ICD-10-CM | POA: Diagnosis not present

## 2020-05-02 DIAGNOSIS — M4312 Spondylolisthesis, cervical region: Secondary | ICD-10-CM | POA: Diagnosis not present

## 2020-05-02 DIAGNOSIS — Z7982 Long term (current) use of aspirin: Secondary | ICD-10-CM

## 2020-05-02 DIAGNOSIS — D509 Iron deficiency anemia, unspecified: Secondary | ICD-10-CM | POA: Diagnosis not present

## 2020-05-02 DIAGNOSIS — Z79899 Other long term (current) drug therapy: Secondary | ICD-10-CM

## 2020-05-02 DIAGNOSIS — Z8249 Family history of ischemic heart disease and other diseases of the circulatory system: Secondary | ICD-10-CM

## 2020-05-02 LAB — I-STAT CHEM 8, ED
BUN: 38 mg/dL — ABNORMAL HIGH (ref 8–23)
Calcium, Ion: 1.12 mmol/L — ABNORMAL LOW (ref 1.15–1.40)
Chloride: 92 mmol/L — ABNORMAL LOW (ref 98–111)
Creatinine, Ser: 1.5 mg/dL — ABNORMAL HIGH (ref 0.61–1.24)
Glucose, Bld: 172 mg/dL — ABNORMAL HIGH (ref 70–99)
HCT: 27 % — ABNORMAL LOW (ref 39.0–52.0)
Hemoglobin: 9.2 g/dL — ABNORMAL LOW (ref 13.0–17.0)
Potassium: 2.8 mmol/L — ABNORMAL LOW (ref 3.5–5.1)
Sodium: 130 mmol/L — ABNORMAL LOW (ref 135–145)
TCO2: 26 mmol/L (ref 22–32)

## 2020-05-02 LAB — BLOOD CULTURE ID PANEL (REFLEXED) - BCID2

## 2020-05-02 LAB — COMPREHENSIVE METABOLIC PANEL
ALT: 26 U/L (ref 0–44)
AST: 25 U/L (ref 15–41)
Albumin: 3 g/dL — ABNORMAL LOW (ref 3.5–5.0)
Alkaline Phosphatase: 47 U/L (ref 38–126)
Anion gap: 11 (ref 5–15)
BUN: 44 mg/dL — ABNORMAL HIGH (ref 8–23)
CO2: 25 mmol/L (ref 22–32)
Calcium: 8.1 mg/dL — ABNORMAL LOW (ref 8.9–10.3)
Chloride: 93 mmol/L — ABNORMAL LOW (ref 98–111)
Creatinine, Ser: 1.53 mg/dL — ABNORMAL HIGH (ref 0.61–1.24)
GFR, Estimated: 48 mL/min — ABNORMAL LOW (ref >60)
Glucose, Bld: 172 mg/dL — ABNORMAL HIGH (ref 70–99)
Potassium: 2.7 mmol/L — CL (ref 3.5–5.1)
Sodium: 129 mmol/L — ABNORMAL LOW (ref 135–145)
Total Bilirubin: 1.2 mg/dL (ref 0.3–1.2)
Total Protein: 6.2 g/dL — ABNORMAL LOW (ref 6.5–8.1)

## 2020-05-02 LAB — URINALYSIS, ROUTINE W REFLEX MICROSCOPIC
Bilirubin Urine: NEGATIVE
Glucose, UA: NEGATIVE mg/dL
Ketones, ur: NEGATIVE mg/dL
Leukocytes,Ua: NEGATIVE
Nitrite: NEGATIVE
Protein, ur: 30 mg/dL — AB
RBC / HPF: >50 RBC/hpf — ABNORMAL HIGH (ref 0–5)
Specific Gravity, Urine: 1.021 (ref 1.005–1.030)
pH: 5 (ref 5.0–8.0)

## 2020-05-02 LAB — LACTIC ACID, PLASMA
Lactic Acid, Venous: 1.2 mmol/L (ref 0.5–1.9)
Lactic Acid, Venous: 1.1 mmol/L (ref 0.5–1.9)

## 2020-05-02 LAB — CBC WITH DIFFERENTIAL/PLATELET
Abs Immature Granulocytes: 0.06 10*3/uL (ref 0.00–0.07)
Basophils Absolute: 0 10*3/uL (ref 0.0–0.1)
Basophils Relative: 0 %
Eosinophils Absolute: 0 10*3/uL (ref 0.0–0.5)
Eosinophils Relative: 0 %
HCT: 29 % — ABNORMAL LOW (ref 39.0–52.0)
Hemoglobin: 9.8 g/dL — ABNORMAL LOW (ref 13.0–17.0)
Immature Granulocytes: 1 %
Lymphocytes Relative: 4 %
Lymphs Abs: 0.4 10*3/uL — ABNORMAL LOW (ref 0.7–4.0)
MCH: 31.3 pg (ref 26.0–34.0)
MCHC: 33.8 g/dL (ref 30.0–36.0)
MCV: 92.7 fL (ref 80.0–100.0)
Monocytes Absolute: 0.7 10*3/uL (ref 0.1–1.0)
Monocytes Relative: 7 %
Neutro Abs: 8.3 10*3/uL — ABNORMAL HIGH (ref 1.7–7.7)
Neutrophils Relative %: 88 %
Platelets: 168 10*3/uL (ref 150–400)
RBC: 3.13 MIL/uL — ABNORMAL LOW (ref 4.22–5.81)
RDW: 13.1 % (ref 11.5–15.5)
WBC: 9.4 10*3/uL (ref 4.0–10.5)
nRBC: 0 % (ref 0.0–0.2)

## 2020-05-02 LAB — RESP PANEL BY RT-PCR (FLU A&B, COVID) ARPGX2
Influenza A by PCR: NEGATIVE
Influenza B by PCR: NEGATIVE
SARS Coronavirus 2 by RT PCR: NEGATIVE

## 2020-05-02 LAB — BASIC METABOLIC PANEL
Anion gap: 7 (ref 5–15)
BUN: 38 mg/dL — ABNORMAL HIGH (ref 8–23)
CO2: 27 mmol/L (ref 22–32)
Calcium: 7.9 mg/dL — ABNORMAL LOW (ref 8.9–10.3)
Chloride: 96 mmol/L — ABNORMAL LOW (ref 98–111)
Creatinine, Ser: 1.5 mg/dL — ABNORMAL HIGH (ref 0.61–1.24)
GFR, Estimated: 49 mL/min — ABNORMAL LOW (ref >60)
Glucose, Bld: 107 mg/dL — ABNORMAL HIGH (ref 70–99)
Potassium: 3.3 mmol/L — ABNORMAL LOW (ref 3.5–5.1)
Sodium: 130 mmol/L — ABNORMAL LOW (ref 135–145)

## 2020-05-02 LAB — MRSA PCR SCREENING: MRSA by PCR: NEGATIVE

## 2020-05-02 LAB — MAGNESIUM: Magnesium: 1.8 mg/dL (ref 1.7–2.4)

## 2020-05-02 LAB — PROTIME-INR
INR: 1.2 (ref 0.8–1.2)
Prothrombin Time: 14.7 seconds (ref 11.4–15.2)

## 2020-05-02 LAB — APTT: aPTT: 48 seconds — ABNORMAL HIGH (ref 24–36)

## 2020-05-02 MED ORDER — METRONIDAZOLE IN NACL 5-0.79 MG/ML-% IV SOLN
500.0000 mg | Freq: Once | INTRAVENOUS | Status: AC
  Administered 2020-05-02: 500 mg via INTRAVENOUS
  Filled 2020-05-02: qty 100

## 2020-05-02 MED ORDER — CEFAZOLIN SODIUM-DEXTROSE 2-4 GM/100ML-% IV SOLN
2.0000 g | Freq: Three times a day (TID) | INTRAVENOUS | Status: DC
  Administered 2020-05-03 – 2020-05-06 (×12): 2 g via INTRAVENOUS
  Filled 2020-05-02 (×17): qty 100

## 2020-05-02 MED ORDER — LACTATED RINGERS IV BOLUS
1000.0000 mL | Freq: Once | INTRAVENOUS | Status: AC
  Administered 2020-05-02: 1000 mL via INTRAVENOUS

## 2020-05-02 MED ORDER — VANCOMYCIN HCL IN DEXTROSE 1-5 GM/200ML-% IV SOLN: 1000.0000 mg | Freq: Once | INTRAVENOUS | Status: DC

## 2020-05-02 MED ORDER — ACETAMINOPHEN 500 MG PO TABS
1000.0000 mg | ORAL_TABLET | Freq: Once | ORAL | Status: AC
  Administered 2020-05-02: 1000 mg via ORAL
  Filled 2020-05-02: qty 2

## 2020-05-02 MED ORDER — LEVOTHYROXINE SODIUM 75 MCG PO TABS
150.0000 ug | ORAL_TABLET | Freq: Every day | ORAL | Status: DC
  Administered 2020-05-04 – 2020-05-06 (×3): 150 ug via ORAL
  Filled 2020-05-02 (×3): qty 2

## 2020-05-02 MED ORDER — POTASSIUM CHLORIDE CRYS ER 20 MEQ PO TBCR: 40.0000 meq | EXTENDED_RELEASE_TABLET | Freq: Once | ORAL | Status: DC

## 2020-05-02 MED ORDER — ACETAMINOPHEN 650 MG RE SUPP: 650.0000 mg | Freq: Four times a day (QID) | RECTAL | Status: DC | PRN

## 2020-05-02 MED ORDER — SODIUM CHLORIDE 0.9 % IV SOLN
2.0000 g | Freq: Once | INTRAVENOUS | Status: AC
  Administered 2020-05-02: 2 g via INTRAVENOUS
  Filled 2020-05-02: qty 2

## 2020-05-02 MED ORDER — POTASSIUM CHLORIDE 10 MEQ/100ML IV SOLN
10.0000 meq | INTRAVENOUS | Status: AC
  Administered 2020-05-02 (×3): 10 meq via INTRAVENOUS
  Filled 2020-05-02 (×3): qty 100

## 2020-05-02 MED ORDER — ONDANSETRON HCL 4 MG PO TABS: 4.0000 mg | ORAL_TABLET | Freq: Four times a day (QID) | ORAL | Status: DC | PRN

## 2020-05-02 MED ORDER — ONDANSETRON HCL 4 MG/2ML IJ SOLN: 4.0000 mg | Freq: Four times a day (QID) | INTRAMUSCULAR | Status: DC | PRN

## 2020-05-02 MED ORDER — IOHEXOL 350 MG/ML SOLN
100.0000 mL | Freq: Once | INTRAVENOUS | Status: AC | PRN
  Administered 2020-05-02: 100 mL via INTRAVENOUS

## 2020-05-02 MED ORDER — GADOBUTROL 1 MMOL/ML IV SOLN
10.0000 mL | Freq: Once | INTRAVENOUS | Status: AC | PRN
  Administered 2020-05-02: 10 mL via INTRAVENOUS

## 2020-05-02 MED ORDER — VANCOMYCIN HCL 2000 MG/400ML IV SOLN
2000.0000 mg | Freq: Once | INTRAVENOUS | Status: AC
  Administered 2020-05-02: 2000 mg via INTRAVENOUS
  Filled 2020-05-02: qty 400

## 2020-05-02 MED ORDER — LACTATED RINGERS IV SOLN: INTRAVENOUS | Status: AC

## 2020-05-02 MED ORDER — ACETAMINOPHEN 325 MG PO TABS
650.0000 mg | ORAL_TABLET | Freq: Four times a day (QID) | ORAL | Status: DC | PRN
  Administered 2020-05-02 – 2020-05-04 (×7): 650 mg via ORAL
  Filled 2020-05-02 (×7): qty 2

## 2020-05-02 NOTE — Sepsis Progress Note (Signed)
Following for sepsis monitoring ?

## 2020-05-02 NOTE — ED Notes (Signed)
Attempted to call 4N at [facility] for [facility] to give report. Secretary said Agricultural consultant has not accepted that room assignment yet. Will call back in 15 minutes.

## 2020-05-02 NOTE — Progress Notes (Signed)
PHARMACY - PHYSICIAN COMMUNICATION CRITICAL VALUE ALERT - BLOOD CULTURE IDENTIFICATION (BCID)  Martin Clark is an 75 y.o. male who presented to [facility] on 05/02/2020 with a chief complaint of AMS and fever. Received vancomycin 2000mg  x1 and cefepime 2g x1 in ED. Antibiotics not continued. NKDA  Assessment: 4/4 GPC s. Aureus no resistance - MSSA  Name of physician (or Provider) Contacted: Dr. Myna Hidalgo  Current antibiotics: None  Changes to prescribed antibiotics recommended: start cefazolin 2g IV 8h  Recommendations accepted by provider  No results found for this or any previous visit.  Cristela Felt, PharmD Clinical Pharmacist  05/02/2020  9:41 PM

## 2020-05-02 NOTE — ED Notes (Signed)
Patient educated on the hold for the next MRI, due in 24 hours, because contrast was used. Patients wife updated on patients transfer to [facility].

## 2020-05-02 NOTE — ED Notes (Signed)
Report given to [place].

## 2020-05-02 NOTE — Plan of Care (Signed)
[name]&Ox4. VSS. Call bell in reach. Resting with no complaints.

## 2020-05-02 NOTE — ED Notes (Signed)
Nurse at [facility] given report and ready for patient.

## 2020-05-02 NOTE — ED Notes (Signed)
Discovered pt had blood clot on hand. Unable to determine other source of blood, suspect blood clot in urine. Urine pink tinged with small clots noted.

## 2020-05-02 NOTE — H&P (Signed)
[facility] History and Physical  Martin Clark ATF:[id number] DOB: 09-23-1945 DOA: 05/02/2020   PCP: Holland Commons, FNP  Specialists: Followed by Dr. Jannifer Franklin, with neurology, for his history of MS  Chief Complaint: Altered mental status and fever  HPI: Martin Clark is a 75 y.o. male with a past medical history of multiple sclerosis, essential hypertension, hypothyroidism who was in his usual state of health till about a week ago when he started developing pain in the upper back area.  He started taking cyclobenzaprine for these pain issues.  And since yesterday he is wife noticed that patient has been somewhat confused.  Subsequently developed fever.  Was brought into the emergency department.  She also mention that patient has been having increasing urinary frequency.  Then he also noticed that he was passing some blood in the urine which was new for today.  Denies any abdominal pain.  He mentioned that he did some yard work for a friend last week and sustained thorn injuries to his skin on multiple sites.  He also gave himself the injection that he takes twice a week for his MS.  No nausea vomiting or diarrhea.  No shortness of breath or chest pain.  Patient has some was distracted.  His wife is at the bedside.  History is limited at this time.  In the emergency department patient underwent an extensive work-up.  UA did show hematuria but no evidence for infection.  Chest x-ray did not show any pneumonia.  Patient subsequently underwent CT angiogram of his chest which did not show any PE.  CT renal stone study did not show any acute findings in the GU tract.  CT of the chest did raise concern for a spinal issue.  So patient underwent MRI of the thoracic and lumbar spine which raised concern for discitis in the upper thoracic and lower cervical spine.  Discussions were held with Dr. Reatha Armour with neurosurgery who recommended MRI of the cervical spine and admission to [facility] for further  work-up.  Patient was given vancomycin and cefepime and metronidazole in the ED.  Neurosurgery requests keeping him off of antibiotics till culture data is available.  Patient may need to undergo disc aspiration.    Home Medications: Prior to Admission medications   Medication Sig Start Date End Date Taking? Authorizing Provider  aspirin EC 81 MG tablet Take 81 mg by mouth daily.    [provider]  cyclobenzaprine (FLEXERIL) 5 MG tablet Take 5 mg by mouth 3 (three) times daily as needed for pain. 05/01/20   [provider]  Glatiramer Acetate 40 MG/ML SOSY Inject 5ml (40mg ) subcutaneously three times a week. 08/16/19   Kathrynn Ducking, MD  levothyroxine (SYNTHROID, LEVOTHROID) 150 MCG tablet Take 150 mcg by mouth daily before breakfast.    [provider]  lisinopril-hydrochlorothiazide (ZESTORETIC) 20-25 MG tablet Take 1 tablet by mouth daily. 04/17/20   [provider]  meloxicam (MOBIC) 15 MG tablet Take 15 mg by mouth daily.    [provider]  OVER THE COUNTER MEDICATION flexuran take daily    [provider]  pramipexole (MIRAPEX) 1 MG tablet Take 1 tablet (1 mg total) by mouth at bedtime. 07/01/16   Kathrynn Ducking, MD  rosuvastatin (CRESTOR) 10 MG tablet Take 10 mg by mouth daily. 06/30/19   [provider]    Allergies: No Known Allergies  Past Medical History: Past Medical History:  Diagnosis Date  . Dyslipidemia   . History  of concussion   . Hypertension   . Hypothyroidism   . Memory disorder   . MS (multiple sclerosis) ([place])   . Obesity   . Restless legs syndrome   . Retinal detachment    right  . RLS (restless legs syndrome) 07/01/2016  . Thyroid cancer [facility])     Past Surgical History:  Procedure Laterality Date  . CATARACT EXTRACTION Right   . renal calculi    . Thyroid cancer resection    . TONSILLECTOMY    . VASECTOMY    . VASECTOMY      Social History: Lives with his wife.  Denies smoking.   Very occasional alcohol consumption.  No illicit drug use.  Usually very independent with daily activities.  Family History:  Family History  Problem Relation Age of Onset  . Heart failure Mother   . Heart failure Father      Review of Systems -  General ROS: positive for  - chills, fatigue and fever Psychological ROS: negative Ophthalmic ROS: negative ENT ROS: negative Allergy and Immunology ROS: negative Hematological and Lymphatic ROS: negative Endocrine ROS: negative Respiratory ROS: no cough, shortness of breath, or wheezing Cardiovascular ROS: no chest pain or dyspnea on exertion Gastrointestinal ROS: no abdominal pain, change in bowel habits, or black or bloody stools Genito-Urinary ROS: Positive for blood in the urine Musculoskeletal ROS: positive for - Back pain Neurological ROS: no TIA or stroke symptoms Dermatological ROS: Multiple skin wounds from thorns  Physical Examination  Vitals:   05/02/20 1526 05/02/20 1530 05/02/20 1545 05/02/20 1615  BP:  102/62 (!) 103/54 103/63  Pulse:  68 71 70  Resp:  13 20 (!) 21  Temp: 98.9 F (37.2 C)     TempSrc: Oral     SpO2:  95% 96% 97%  Weight:      Height:        BP 103/63   Pulse 70   Temp 98.9 F (37.2 C) (Oral)   Resp (!) 21   Ht 5\' 10"  (1.778 m)   Wt 101.6 kg   SpO2 97%   BMI 32.14 kg/m   General appearance: alert, cooperative, distracted and no distress Head: Normocephalic, without obvious abnormality, atraumatic Eyes: conjunctivae/corneas clear. PERRL, EOM's intact.  Throat: Dry mucous membranes Neck: no adenopathy, no carotid bruit, no JVD, supple, symmetrical, trachea midline and thyroid not enlarged, symmetric, no tenderness/mass/nodules Resp: clear to auscultation bilaterally Cardio: regular rate and rhythm, S1, S2 normal, no murmur, click, rub or gallop GI: soft, non-tender; bowel sounds normal; no masses,  no organomegaly Extremities: extremities normal, atraumatic, no cyanosis or edema Pulses:  2+ and symmetric Skin: Multiple skin wounds noted on the torso and lower extremities.  No purulence. Lymph nodes: Cervical, supraclavicular, and axillary nodes normal. Neurologic: Alert oriented to place person year.  Cranial nerves II to XII intact.  Motor strength equal bilateral upper and lower extremities   Labs on Admission: I have personally reviewed following labs and imaging studies  CBC: Recent Labs  Lab 05/02/20 0635 05/02/20 0653  WBC 9.4  --   NEUTROABS 8.3*  --   HGB 9.8* 9.2*  HCT 29.0* 27.0*  MCV 92.7  --   PLT 168  --    Basic Metabolic Panel: Recent Labs  Lab 05/02/20 0635 05/02/20 0651 05/02/20 0653  NA 129*  --  130*  K 2.7*  --  2.8*  CL 93*  --  92*  CO2 25  --   --  GLUCOSE 172*  --  172*  BUN 44*  --  38*  CREATININE 1.53*  --  1.50*  CALCIUM 8.1*  --   --   MG  --  1.8  --    GFR: Estimated Creatinine Clearance: 53.1 mL/min (A) (by C-G formula based on SCr of 1.5 mg/dL (H)). Liver Function Tests: Recent Labs  Lab 05/02/20 0635  AST 25  ALT 26  ALKPHOS 47  BILITOT 1.2  PROT 6.2*  ALBUMIN 3.0*   Coagulation Profile: Recent Labs  Lab 05/02/20 0635  INR 1.2     Radiological Exams on Admission: CT Angio Chest PE W/Cm &/Or Wo Cm  Result Date: 05/02/2020 CLINICAL DATA:  Fever, altered mental status. EXAM: CT ANGIOGRAPHY CHEST WITH CONTRAST TECHNIQUE: Multidetector CT imaging of the chest was performed using the standard protocol during bolus administration of intravenous contrast. Multiplanar CT image reconstructions and MIPs were obtained to evaluate the vascular anatomy. CONTRAST:  127mL OMNIPAQUE IOHEXOL 350 MG/ML SOLN COMPARISON:  December 12, 2009. FINDINGS: Cardiovascular: Satisfactory opacification of the pulmonary arteries to the segmental level. No evidence of pulmonary embolism. Normal heart size. No pericardial effusion. Atherosclerosis of thoracic aorta is noted without aneurysm or dissection. Mediastinum/Nodes: Status post  thyroidectomy. The esophagus is unremarkable. No significant adenopathy is noted. There is noted significantly increased prevertebral soft tissue thickening anterior to the upper thoracic spine. Lungs/Pleura: No pneumothorax or pleural effusion is noted. Mild bibasilar subsegmental atelectasis is noted posteriorly. Musculoskeletal: No chest wall abnormality. No acute or significant osseous findings. Review of the MIP images confirms the above findings. IMPRESSION: No definite evidence of pulmonary embolus. Significantly increased prevertebral soft tissue thickening is noted anterior to the upper thoracic spine of unknown etiology. MRI may be performed for further evaluation. Aortic Atherosclerosis (ICD10-I70.0). Electronically Signed   By: Marijo Conception M.D.   On: 05/02/2020 09:42   MR THORACIC SPINE W WO CONTRAST  Result Date: 05/02/2020 CLINICAL DATA:  Intervertebral disc disorder EXAM: MRI THORACIC AND LUMBAR SPINE WITHOUT AND WITH CONTRAST TECHNIQUE: Multiplanar and multiecho pulse sequences of the thoracic and lumbar spine were obtained without and with intravenous contrast. CONTRAST:  96mL GADAVIST GADOBUTROL 1 MMOL/ML IV SOLN COMPARISON:  CT chest May 02, 2020. FINDINGS: Motion limited evaluation.  Within this limitation: MRI THORACIC SPINE FINDINGS Alignment:  Normal alignment. Vertebrae: Vertebral body heights are maintained. No specific evidence of acute fracture, discitis/osteomyelitis, or suspicious bone lesion in the thoracic spine. On the counter sequence there is T1 hypointensity of the lower cervical vertebral bodies, indeterminate but potentially represent edema versus degenerative change. Cord: Limited evaluation due to motion without definite cord signal abnormality. Paraspinal and other soft tissues: Abnormal prevertebral and paraspinal edema and enhancement at the cervicothoracic junction. On the counter sequence, there is suggestion of this process extending superiorly with prevertebral  edema in the cervical spine Susspected small fluid collection ventrally at C7-T1 and possible thin ventral enhancement of the epidural canal at this level (series 33, image 6). Disc levels: Central disc protrusion at C7-T1 is partially imaged on axial sequences and contacts and deforms the ventral cord without significant canal stenosis. Small central disc protrusion at T6-T7 and right eccentric disc protrusion at T7-T8 the ventral cord without significant canal stenosis. Posterior disc bulging at T10-T11 with left eccentric annular fissure, bilateral facet hypertrophy, and ligamentum flavum thickening. Resulting mild to moderate canal stenosis and moderate right and mild left foraminal stenosis. MRI LUMBAR SPINE FINDINGS Segmentation:  Transitional lumbosacral anatomy with lumbarized S1.  Alignment:  No substantial sagittal subluxation. Vertebrae: Vertebral body heights are maintained. No specific evidence of acute fracture, discitis/osteomyelitis, or suspicious bone lesion. Degenerative Schmorl's nodes at multiple levels. Discogenic/degenerative endplate signal changes at multiple levels. Conus medullaris: Extends to the T12-L1 level and appears normal. Paraspinal and other soft tissues: Negative Disc levels: T12-L1: Right eccentric disc bulging and bilateral facet hypertrophy and ligamentum flavum thickening. Mild right subarticular recess stenosis and mild right foraminal stenosis. No significant central canal or left foraminal stenosis. L1-L2: Broad disc bulge, bilateral facet hypertrophy and ligamentum flavum thickening. Resulting moderate to severe canal stenosis and mild left foraminal stenosis. L2-L3: Broad disc bulge, bilateral facet hypertrophy, and ligamentum flavum thickening. Mild left foraminal stenosis without significant canal or right foraminal stenosis. L3-L4: Disc height loss and desiccation. Disc bulge and endplate spurring. Facet hypertrophy and ligamentum flavum thickening. Mild left foraminal  stenosis. No significant central canal or right foraminal stenosis. Mild right subarticular recess narrowing. L4-L5: Endplate spurring. Bilateral facet hypertrophy. Mild to moderate bilateral foraminal stenosis. No significant central canal stenosis. L5-S1: Endplate spurring. Bilateral facet hypertrophy. Moderate bilateral foraminal stenosis. No significant canal stenosis. S1-S2: Bilateral facet hypertrophy with mild left foraminal stenosis. IMPRESSION: MRI thoracic spine: 1. Abnormal prevertebral and paraspinal edema and enhancement at the cervicothoracic junction which is partially imaged on this study. Suspected small pervertbral fluid collection at C7-T1 and possible thin ventral enhancement of the epidural canal at this level. On the counter sequence, this process appears to extend cranially with prevertebral edema in the cervical spine and possible abnormal marrow edema of the lower cervical vertebral bodies versus degenerative change. Findings are concerning for infection, but are incompletely characterized. Recommend MRI of the cervical spine with contrast to further characterize and evaluate for discitis/osteomyelitis. 2. At T10-T11 there is mild to moderate canal stenosis and moderate right and mild left foraminal stenosis. 3. Small disc protrusions at T6-T7 and T7-T8 flatten the ventral cord without significant canal stenosis. 4. Central disc protrusion at C7-T1 is partially imaged on axial sequences and contacts and deforms the ventral cord without significant canal stenosis. The recommended MRI of the cervical spine can further characterize. MRI lumbar spine: 1. Transitional lumbosacral anatomy with lumbarized S1. 2. Moderate to severe canal stenosis at L2-L3. 3. Bilateral moderate foraminal stenosis at L5-S1 and mild to moderate foraminal stenosis at L4-L5. Mild foraminal stenosis on the left at L1-L2 and L3-L4 and the right at T12-L1. Findings and recommendations discussed with Dr. Eulis Foster via telephone  at 1:28 PM Electronically Signed   By: Margaretha Sheffield MD   On: 05/02/2020 13:35   MR Lumbar Spine W Wo Contrast  Result Date: 05/02/2020 CLINICAL DATA:  Intervertebral disc disorder EXAM: MRI THORACIC AND LUMBAR SPINE WITHOUT AND WITH CONTRAST TECHNIQUE: Multiplanar and multiecho pulse sequences of the thoracic and lumbar spine were obtained without and with intravenous contrast. CONTRAST:  8mL GADAVIST GADOBUTROL 1 MMOL/ML IV SOLN COMPARISON:  CT chest May 02, 2020. FINDINGS: Motion limited evaluation.  Within this limitation: MRI THORACIC SPINE FINDINGS Alignment:  Normal alignment. Vertebrae: Vertebral body heights are maintained. No specific evidence of acute fracture, discitis/osteomyelitis, or suspicious bone lesion in the thoracic spine. On the counter sequence there is T1 hypointensity of the lower cervical vertebral bodies, indeterminate but potentially represent edema versus degenerative change. Cord: Limited evaluation due to motion without definite cord signal abnormality. Paraspinal and other soft tissues: Abnormal prevertebral and paraspinal edema and enhancement at the cervicothoracic junction. On the counter sequence, there is suggestion of  this process extending superiorly with prevertebral edema in the cervical spine Susspected small fluid collection ventrally at C7-T1 and possible thin ventral enhancement of the epidural canal at this level (series 33, image 6). Disc levels: Central disc protrusion at C7-T1 is partially imaged on axial sequences and contacts and deforms the ventral cord without significant canal stenosis. Small central disc protrusion at T6-T7 and right eccentric disc protrusion at T7-T8 the ventral cord without significant canal stenosis. Posterior disc bulging at T10-T11 with left eccentric annular fissure, bilateral facet hypertrophy, and ligamentum flavum thickening. Resulting mild to moderate canal stenosis and moderate right and mild left foraminal stenosis. MRI  LUMBAR SPINE FINDINGS Segmentation:  Transitional lumbosacral anatomy with lumbarized S1. Alignment:  No substantial sagittal subluxation. Vertebrae: Vertebral body heights are maintained. No specific evidence of acute fracture, discitis/osteomyelitis, or suspicious bone lesion. Degenerative Schmorl's nodes at multiple levels. Discogenic/degenerative endplate signal changes at multiple levels. Conus medullaris: Extends to the T12-L1 level and appears normal. Paraspinal and other soft tissues: Negative Disc levels: T12-L1: Right eccentric disc bulging and bilateral facet hypertrophy and ligamentum flavum thickening. Mild right subarticular recess stenosis and mild right foraminal stenosis. No significant central canal or left foraminal stenosis. L1-L2: Broad disc bulge, bilateral facet hypertrophy and ligamentum flavum thickening. Resulting moderate to severe canal stenosis and mild left foraminal stenosis. L2-L3: Broad disc bulge, bilateral facet hypertrophy, and ligamentum flavum thickening. Mild left foraminal stenosis without significant canal or right foraminal stenosis. L3-L4: Disc height loss and desiccation. Disc bulge and endplate spurring. Facet hypertrophy and ligamentum flavum thickening. Mild left foraminal stenosis. No significant central canal or right foraminal stenosis. Mild right subarticular recess narrowing. L4-L5: Endplate spurring. Bilateral facet hypertrophy. Mild to moderate bilateral foraminal stenosis. No significant central canal stenosis. L5-S1: Endplate spurring. Bilateral facet hypertrophy. Moderate bilateral foraminal stenosis. No significant canal stenosis. S1-S2: Bilateral facet hypertrophy with mild left foraminal stenosis. IMPRESSION: MRI thoracic spine: 1. Abnormal prevertebral and paraspinal edema and enhancement at the cervicothoracic junction which is partially imaged on this study. Suspected small pervertbral fluid collection at C7-T1 and possible thin ventral enhancement of  the epidural canal at this level. On the counter sequence, this process appears to extend cranially with prevertebral edema in the cervical spine and possible abnormal marrow edema of the lower cervical vertebral bodies versus degenerative change. Findings are concerning for infection, but are incompletely characterized. Recommend MRI of the cervical spine with contrast to further characterize and evaluate for discitis/osteomyelitis. 2. At T10-T11 there is mild to moderate canal stenosis and moderate right and mild left foraminal stenosis. 3. Small disc protrusions at T6-T7 and T7-T8 flatten the ventral cord without significant canal stenosis. 4. Central disc protrusion at C7-T1 is partially imaged on axial sequences and contacts and deforms the ventral cord without significant canal stenosis. The recommended MRI of the cervical spine can further characterize. MRI lumbar spine: 1. Transitional lumbosacral anatomy with lumbarized S1. 2. Moderate to severe canal stenosis at L2-L3. 3. Bilateral moderate foraminal stenosis at L5-S1 and mild to moderate foraminal stenosis at L4-L5. Mild foraminal stenosis on the left at L1-L2 and L3-L4 and the right at T12-L1. Findings and recommendations discussed with Dr. Eulis Foster via telephone at 1:28 PM Electronically Signed   By: Margaretha Sheffield MD   On: 05/02/2020 13:35   DG Chest Port 1 View  Result Date: 05/02/2020 CLINICAL DATA:  Cough, fever EXAM: PORTABLE CHEST 1 VIEW COMPARISON:  None. FINDINGS: Mild bibasilar atelectasis. Lungs are otherwise clear. No pneumothorax or pleural  effusion. Cardiac size within normal limits. Pulmonary vascularity is normal. No acute bone abnormality. IMPRESSION: No active disease. Electronically Signed   By: Fidela Salisbury MD   On: 05/02/2020 06:06   CT Renal Stone Study  Result Date: 05/02/2020 CLINICAL DATA:  Fever and altered mental status. Increased urinary frequency. EXAM: CT ABDOMEN AND PELVIS WITHOUT CONTRAST TECHNIQUE: Multidetector  CT imaging of the abdomen and pelvis was performed following the standard protocol without IV contrast. COMPARISON:  CT abdomen pelvis dated September 03, 2011. FINDINGS: Lower chest: No acute abnormality. Dependent subsegmental atelectasis in both lower lobes. Hypodense blood pool relative to myocardium, suggestive of anemia. Hepatobiliary: Multiple hepatic cysts are stable or slightly increased in size since 2013. Unchanged 3.1 cm hypodense lesion in the inferior right hepatic lobe, previously characterized as a hemangioma. The gallbladder is unremarkable. No biliary dilatation. Pancreas: Unremarkable. No pancreatic ductal dilatation or surrounding inflammatory changes. Spleen: Normal in size without focal abnormality. Adrenals/Urinary Tract: The adrenal glands and right kidney are unremarkable. Slight interval increase in size of the large left renal simple cyst, currently measuring 9.1 cm, previously 7.9 cm. No renal calculi or hydronephrosis. The bladder is unremarkable for the degree of distention. Stomach/Bowel: Stomach is within normal limits. Appendix appears normal. No evidence of bowel wall thickening, distention, or inflammatory changes. Vascular/Lymphatic: Aortic atherosclerosis. No enlarged abdominal or pelvic lymph nodes. Reproductive: Prostate is unremarkable. Other: Unchanged small fat containing left inguinal hernia. No free fluid or pneumoperitoneum. Musculoskeletal: No acute or significant osseous findings. Old right-sided rib fractures. IMPRESSION: 1. No acute intra-abdominal process. 2. Aortic Atherosclerosis (ICD10-I70.0). Electronically Signed   By: Titus Dubin M.D.   On: 05/02/2020 09:51    My interpretation of Electrocardiogram: Sinus rhythm in the 90s.  Normal axis.  RBBB is noted.  No order EKG available for comparison   Problem List  Active Problems:   Multiple sclerosis (HCC)   Discitis   Hematuria   Hyponatremia   Hypokalemia   AKI (acute kidney injury) ([place])    Hypotension   Hypothyroidism   Assessment: This is a 75 year old Caucasian male with a past medical history of MS, hypertension hypothyroidism who comes in with altered mental status and fever ongoing for a day.  Patient has been experiencing upper back pain for the past week or so and had been taking cyclobenzaprine at home.  There is concern for infection in his spine.  Patient will be hospitalized for further management.  Plan:  1.  Fevers/concern for spinal infection: Imaging studies of raise concern for an infectious process in the upper thoracic and lower cervical spine.  Findings were discussed with Dr. Reatha Armour with neurosurgery by the ED provider.  MRI cervical spine was recommended and is pending.  Patient has been given vancomycin cefepime and Flagyl but will hold off on further antibiotics as per neurosurgery recommendations in case patient needs to undergo procedure or aspiration.  Would prefer to have culture data.  Does not have any neurological deficits at this time.  Follow-up on blood cultures.  No clear evidence for sepsis.  Lactic acid level was normal.  Will check procalcitonin.  COVID-19 and influenza PCR's were negative.  2. Hematuria: UA does not suggest infection but does show RBCs.  Patient has had gross hematuria.  CT renal stone study did not show any nephrolithiasis or any other abnormal findings in the GU tract which could account for his hematuria.  Monitor for now.  May need Foley catheter if he has difficulty urinating.  3.  Normocytic anemia: No recent labs available in epic.  Unclear if he has chronic anemia.  Will check anemia panel.  Apart from hematuria no other overt bleeding noted.  4. Acute kidney injury with hyponatremia and hypokalemia: He is being aggressively hydrated in the ED.  He was given potassium supplements.  We will recheck labs this afternoon and then again tomorrow.  Monitor urine output.  Check magnesium.  5.  History of essential hypertension  with hypotension in the hospital: We will hold his lisinopril HCTZ.  Blood pressures have improved with hydration.  6.  Multiple skin wounds: Patient was working in the yard and sustained on injuries.  No obvious purulence noted over the skin but he does have multiple areas of concern.  Could have been from where bacteria might have been introduced.  7. Hyperglycemia: We will check HbA1c.  No documented history of diabetes.  8. Acute metabolic encephalopathy: Likely triggered by fever as well as use of cyclobenzaprine recently.  No focal neurological deficits noted.  Continue to monitor.  9. History of MS: Followed by Dr. Jannifer Franklin with neurology.  Takes glatiramer twice a week.  10.  History of hypothyroidism: Continue levothyroxine.  Check TSH free T4.   DVT Prophylaxis: SCDs for now Code Status: Full code Family Communication: Discussed with patient's wife Disposition: hopefully return home when improved Consults called: Dr. Reatha Armour with neurosurgery Admission Status: Status is: Inpatient  Remains inpatient appropriate because:IV treatments appropriate due to intensity of illness or inability to take PO and Inpatient level of care appropriate due to severity of illness   Dispo: The patient is from: Home              Anticipated d/c is to: Home              Patient currently is not medically stable to d/c.   Difficult to place patient No     Severity of Illness: The appropriate patient status for this patient is INPATIENT. Inpatient status is judged to be reasonable and necessary in order to provide the required intensity of service to ensure the patient's safety. The patient's presenting symptoms, physical exam findings, and initial radiographic and laboratory data in the context of their chronic comorbidities is felt to place them at high risk for further clinical deterioration. Furthermore, it is not anticipated that the patient will be medically stable for discharge from the  hospital within 2 midnights of admission. The following factors support the patient status of inpatient.   " The patient's presenting symptoms include fever, altered mental status. " The worrisome physical exam findings include hypotension. " The initial radiographic and laboratory data are worrisome because of acute kidney injury, hyponatremia, hypokalemia. " The chronic co-morbidities include multiple sclerosis.   * I certify that at the point of admission it is my clinical judgment that the patient will require inpatient hospital care spanning beyond 2 midnights from the point of admission due to high intensity of service, high risk for further deterioration and high frequency of surveillance required.*    Further management decisions will depend on results of further testing and patient's response to treatment.   Martin Clark  [employer] operational officer on [email or web address]  05/02/2020, 4:37 PM

## 2020-05-02 NOTE — ED Notes (Signed)
Patient transported to MRI 

## 2020-05-02 NOTE — ED Provider Notes (Signed)
7:10 AM-checkout from Dr. Phineas Douglas to evaluate patient following return of labs and arrange for admission for hypoxia.  Patient presenting with URI symptoms, and likely COVID, was found to be hypoxic, which corrected with nasal cannula at 2 L.  Patient hypokalemia on initial laboratory testing.  He has been treated for sepsis of unknown etiology, with empiric antibiotics, and a bolus of LR.  Lactate pending at this time.  Blood pressure marginal at 90/53.   Patient Vitals for the past 24 hrs:  BP Temp Temp src Pulse Resp SpO2 Height Weight  05/02/20 1530 102/62 -- -- 68 13 95 % -- --  05/02/20 1526 -- 98.9 F (37.2 C) Oral -- -- -- -- --  05/02/20 1515 (!) 105/57 -- -- 73 14 97 % -- --  05/02/20 1445 (!) 90/57 -- -- 75 18 95 % -- --  05/02/20 1415 (!) 102/57 -- -- 80 (!) 26 96 % -- --  05/02/20 1400 (!) 116/56 -- -- 74 (!) 22 96 % -- --  05/02/20 1345 (!) 124/55 -- -- 88 (!) 21 94 % -- --  05/02/20 1330 95/62 -- -- 76 (!) 25 95 % -- --  05/02/20 1320 -- (!) 100.9 F (38.3 C) Oral -- -- -- -- --  05/02/20 1317 (!) 101/57 -- -- 80 (!) 23 97 % -- --  05/02/20 1315 (!) 106/56 -- -- 79 20 93 % -- --  05/02/20 1308 -- -- -- -- -- 97 % -- --  05/02/20 1307 (!) 101/57 (!) 101.6 F (38.7 C) -- -- (!) 27 97 % -- --  05/02/20 1228 (!) 131/97 -- -- (!) 52 (!) 23 100 % -- --  05/02/20 1224 -- -- -- 76 20 -- -- --  05/02/20 1115 (!) 108/49 -- -- 79 (!) 25 100 % -- --  05/02/20 1100 (!) 102/53 -- -- 73 (!) 23 100 % -- --  05/02/20 1045 (!) 110/53 -- -- 73 (!) 25 97 % -- --  05/02/20 1030 (!) 97/55 -- -- 74 (!) 22 96 % -- --  05/02/20 1015 (!) 93/51 -- -- 73 (!) 22 99 % -- --  05/02/20 1000 (!) 96/53 -- -- 73 (!) 21 98 % -- --  05/02/20 0945 (!) 102/56 -- -- 73 (!) 21 99 % -- --  05/02/20 0930 (!) 104/55 -- -- 74 (!) 21 100 % -- --  05/02/20 0915 -- -- -- 74 19 99 % -- --  05/02/20 0845 -- -- -- 71 11 100 % -- --  05/02/20 0834 -- 99.1 F (37.3 C) Oral -- -- -- -- --  05/02/20 0830 (!) 88/51 -- --  73 17 100 % -- --  05/02/20 0815 (!) 92/54 -- -- 74 16 100 % -- --  05/02/20 0800 (!) 88/53 -- -- 75 16 100 % -- --  05/02/20 0745 (!) 87/53 -- -- 77 10 98 % -- --  05/02/20 0730 (!) 87/54 -- -- 77 (!) 25 97 % -- --  05/02/20 0725 (!) 89/51 -- -- 63 (!) 26 98 % -- --  05/02/20 0715 (!) 82/54 -- -- 78 (!) 22 97 % -- --  05/02/20 0700 (!) 87/51 -- -- 81 (!) 24 97 % -- --  05/02/20 0645 (!) 90/53 -- -- 86 (!) 22 97 % -- --  05/02/20 0630 (!) 94/54 -- -- 82 (!) 26 96 % -- --  05/02/20 0545 -- -- -- -- -- 96 % -- --  05/02/20 0540 (!) 98/54 -- -- 92 (!) 24 (!) 89 % -- --  05/02/20 0537 -- -- -- -- -- -- 5\' 10"  (1.778 m) 101.6 kg  05/02/20 0535 -- -- -- -- -- 94 % -- --  05/02/20 0534 -- (!) 102 F (38.9 C) Oral -- -- -- -- --    8:10 AM Reevaluation with update and discussion. After initial assessment and treatment, an updated evaluation reveals this time the patient is alert, uncomfortable.  His wife is in the room and helping to communicate.  She states that he began to have a fever last night.  He has ongoing mid upper back pain, that he locates between his "shoulder blades."  He has been taking an unknown muscle relaxer for that with improvement.  He complains that the muscle doctor makes him "groggy".  He has a history of kidney stones, remotely.  No prior history of VTE. Daleen Bo   Clinical Course as of 05/02/20 1536  Thu May 02, 2020  1335 Was contacted by radiologist who states that patient has signs for anterior spine infection, upper thoracic and cervical.  He requested additional imaging with MRI cervical spine. [EW]  50 I discussed case with neurosurgeon, Dr. Reatha Armour.  He states patient should be admitted to medicine, and he will be on board as a Optometrist with further recommendations after the MRI of the cervical spine is completed.  He states no additional antibiotics at this time, likely until decision making is complete.  No immediate plans for surgery at this time. [EW]     Clinical Course User Index [EW] Daleen Bo, MD     Medical Decision Making:  This patient is presenting for evaluation of Mid upper back pain, which does require a range of treatment options, and is a complaint that involves a high risk of morbidity and mortality. The differential diagnoses include acute infection, PE, kidney stone. I decided to review old records, and in summary elderly male presenting for evaluation of mid upper back pain, with fever and sepsis criteria on arrival.  No known trauma.  I obtained additional historical information from wife at bedside.  Clinical Laboratory Tests Ordered, included CBC, Metabolic panel and Lactate, blood cultures. Review indicates abnormalities include sodium low, potassium low, chloride low, BUN high, creatinine high, glucose high, calcium low, hemoglobin low, blood in urine. Radiologic Tests Ordered, included chest x-ray, CT angiogram chest, CT renal abdomen pelvis, MRI thoracic and lumbar, MRI cervical.  I independently Visualized: Radiographic images, which show discitis/osteomyelitis with anterior findings concerning for infection likely source for sepsis.  Cardiac Monitor Tracing which shows normal sinus rhythm   .Critical Care Performed by: Daleen Bo, MD Authorized by: Daleen Bo, MD   Critical care provider statement:    Critical care time (minutes):  95   Critical care start time:  05/02/2020 3:34 PM   Critical care time was exclusive of:  Separately billable procedures and treating other patients   Critical care was necessary to treat or prevent imminent or life-threatening deterioration of the following conditions:  Sepsis   Critical care was time spent personally by me on the following activities:  Blood draw for specimens, development of treatment plan with patient or surrogate, discussions with consultants, evaluation of patient's response to treatment, examination of patient, obtaining history from patient or  surrogate, ordering and performing treatments and interventions, ordering and review of laboratory studies, pulse oximetry, re-evaluation of patient's condition, review of old charts and ordering and review of radiographic  studies  doc  Critical Interventions-clinical evaluation, laboratory testing, empiric antibiotics, multiple advanced imaging radiographic procedures, observation, reassessment  After These Interventions, the Patient was reevaluated and was found with likely spinal infection causing sepsis, and incidental findings of hyponatremia and Hypokalemia.  Patient requires hospitalization for stabilization  CRITICAL CARE-yes Performed by: Daleen Bo  Nursing Notes Reviewed/ Care Coordinated Applicable Imaging Reviewed Interpretation of Laboratory Data incorporated into ED treatment  3:36 PM-Consult complete with hospitalist. Patient case explained and discussed. He agrees to admit patient for further evaluation and treatment. Call ended at 4:01 PM  Plan: Admit      Daleen Bo, MD 05/02/20 [phone]

## 2020-05-02 NOTE — ED Provider Notes (Addendum)
[facility] DEPT Provider Note   CSN: [id number] Arrival date & time: 05/02/20  6384     History Chief Complaint  Patient presents with  . Altered Mental Status    Martin Clark is a 75 y.o. male.  The history is provided by the patient and the EMS personnel. The history is limited by the condition of the patient (memory issues ).  Altered Mental Status Presenting symptoms: confusion   Severity:  Moderate Most recent episode:  Today Episode history:  Single Progression:  Unchanged Chronicity:  New Context: not homeless   Context comment:  Started a muscle relaxant and has had a day of fever.   Associated symptoms: fever   Associated symptoms: no abdominal pain, no headaches, no nausea, no rash, no slurred speech, no vomiting and no weakness   Patient with memory disorder presents with AMS and fever from home.  He denies any and all other symptoms.  He states he is not covid vaccinated and has been using ivermectin.      Past Medical History:  Diagnosis Date  . Dyslipidemia   . History of concussion   . Hypertension   . Hypothyroidism   . Memory disorder   . MS (multiple sclerosis) ([place])   . Obesity   . Restless legs syndrome   . Retinal detachment    right  . RLS (restless legs syndrome) 07/01/2016  . Thyroid cancer [facility])     Patient Active Problem List   Diagnosis Date Noted  . Right epiretinal membrane 09/14/2019  . 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 RLS (restless legs syndrome) 07/01/2016  . Multiple sclerosis ([place]) 06/06/2012  . Memory disturbance 06/06/2012    Past Surgical History:  Procedure Laterality Date  . CATARACT EXTRACTION Right   . renal calculi    . Thyroid cancer resection    . TONSILLECTOMY    . VASECTOMY    . VASECTOMY         Family History  Problem Relation Age of Onset  . Heart failure Mother   . Heart failure Father      Social History   Tobacco Use  . Smoking status: Never Smoker  . Smokeless tobacco: Never Used  Substance Use Topics  . Alcohol use: Yes    Alcohol/week: 0.0 standard drinks    Comment: monthly  . Drug use: No    Home Medications Prior to Admission medications   Medication Sig Start Date End Date Taking? Authorizing Provider  aspirin EC 81 MG tablet Take 81 mg by mouth daily.    [provider]  Glatiramer Acetate 40 MG/ML SOSY Inject 70ml (40mg ) subcutaneously three times a week. 08/16/19   Kathrynn Ducking, MD  levothyroxine (SYNTHROID, LEVOTHROID) 150 MCG tablet Take 150 mcg by mouth daily before breakfast.    [provider]  lisinopril-hydrochlorothiazide (ZESTORETIC) 10-12.5 MG tablet Take 1 tablet by mouth daily. 06/12/19   [provider]  meloxicam (MOBIC) 15 MG tablet Take 15 mg by mouth daily.    [provider]  OVER THE COUNTER MEDICATION flexuran take daily    [provider]  pramipexole (MIRAPEX) 1 MG tablet Take 1 tablet (1 mg total) by mouth at bedtime. 07/01/16   Kathrynn Ducking, MD  rosuvastatin (CRESTOR) 10 MG tablet Take 10 mg by mouth daily. 06/30/19   [provider]    Allergies    Patient has no  known allergies.  Review of Systems   Review of Systems  Unable to perform ROS: Mental status change  Constitutional: Positive for fever.  HENT: Negative for congestion.   Eyes: Negative for visual disturbance.  Respiratory: Negative for cough and shortness of breath.   Cardiovascular: Negative for chest pain.  Gastrointestinal: Negative for abdominal pain, nausea and vomiting.  Genitourinary: Negative for dysuria.  Musculoskeletal: Negative for neck pain.  Skin: Negative for rash.  Neurological: Negative for weakness and headaches.  Psychiatric/Behavioral: Positive for confusion.    Physical Exam Updated Vital Signs BP (!) 98/54 (BP Location: Right Arm)   Pulse 92   Temp (!) 102 F (38.9 C)  (Oral)   Resp (!) 24   Ht 5\' 10"  (1.778 m)   Wt 101.6 kg   SpO2 96%   BMI 32.14 kg/m   Physical Exam Vitals and nursing note reviewed.  Constitutional:      General: He is not in acute distress.    Appearance: Normal appearance.  HENT:     Head: Normocephalic and atraumatic.     Nose: Nose normal.  Eyes:     Conjunctiva/sclera: Conjunctivae normal.     Pupils: Pupils are equal, round, and reactive to light.  Cardiovascular:     Rate and Rhythm: Normal rate and regular rhythm.     Pulses: Normal pulses.     Heart sounds: Normal heart sounds.  Pulmonary:     Breath sounds: Decreased air movement present. No wheezing or rales.  Abdominal:     General: Bowel sounds are normal.     Palpations: Abdomen is soft.     Tenderness: There is no abdominal tenderness. There is no guarding or rebound.  Musculoskeletal:        General: Normal range of motion.     Cervical back: Normal range of motion and neck supple.     Right lower leg: No edema.     Left lower leg: No edema.  Skin:    General: Skin is warm.     Capillary Refill: Capillary refill takes less than 2 seconds.     Findings: No rash.  Neurological:     Mental Status: He is alert.     Deep Tendon Reflexes: Reflexes normal.  Psychiatric:        Mood and Affect: Mood normal.     ED Results / Procedures / Treatments   Labs (all labs ordered are listed, but only abnormal results are displayed) Labs Reviewed  RESP PANEL BY RT-PCR (FLU A&B, COVID) ARPGX2  CULTURE, BLOOD (ROUTINE X 2)  CULTURE, BLOOD (ROUTINE X 2)  URINE CULTURE  LACTIC ACID, PLASMA  LACTIC ACID, PLASMA  COMPREHENSIVE METABOLIC PANEL  CBC WITH DIFFERENTIAL/PLATELET  PROTIME-INR  APTT  URINALYSIS, ROUTINE W REFLEX MICROSCOPIC  I-STAT CHEM 8, ED    EKG  EKG Interpretation  Date/Time:  Thursday May 02 2020 05:40:40 EDT Ventricular Rate:  91 PR Interval:  211 QRS Duration: 149 QT Interval:  387 QTC Calculation: 477 R Axis:   51 Text  Interpretation: Sinus rhythm Borderline prolonged PR interval Right bundle branch block Confirmed by Meilyn Heindl ([id number]) on 05/02/2020 6:08:46 AM       Radiology No results found.  Procedures Procedures   Medications Ordered in ED Medications  lactated ringers infusion (has no administration in time range)  metroNIDAZOLE (FLAGYL) IVPB 500 mg (500 mg Intravenous New Bag/Given 05/02/20 0640)  acetaminophen (TYLENOL) tablet 1,000 mg (0 mg Oral Hold 05/02/20 0637)  vancomycin (  VANCOREADY) IVPB 2000 mg/400 mL (2,000 mg Intravenous New Bag/Given 05/02/20 0639)  lactated ringers bolus 1,000 mL (has no administration in time range)  ceFEPIme (MAXIPIME) 2 g in sodium chloride 0.9 % 100 mL IVPB (2 g Intravenous New Bag/Given 05/02/20 0629)  lactated ringers bolus 1,000 mL (1,000 mLs Intravenous New Bag/Given 05/02/20 8657)    ED Course  I have reviewed the triage vital signs and the nursing notes.  Pertinent labs & imaging results that were available during my care of the patient were reviewed by me and considered in my medical decision making (see chart for details).    MDM Number of Diagnoses or Management Options Critical Care Total time providing critical care: 30-74 minutes (multiple antibiotics per sepsis protocol and IVF) MDM Reviewed: nursing note and vitals Interpretation: ECG, x-ray and labs (NACPD by me on CXR) Total time providing critical care: 30-74 minutes (multiple antibiotics per sepsis protocol and IVF). This excludes time spent performing separately reportable procedures and services.  CRITICAL CARE Performed by: Iver Miklas K Mae Cianci-Rasch Total critical care time: 30 minutes Critical care time was exclusive of separately billable procedures and treating other patients. Critical care was necessary to treat or prevent imminent or life-threatening deterioration. Critical care was time spent personally by me on the following activities: development of treatment plan with  patient and/or surrogate as well as nursing, discussions with consultants, evaluation of patient's response to treatment, examination of patient, obtaining history from patient or surrogate, ordering and performing treatments and interventions, ordering and review of laboratory studies, ordering and review of radiographic studies, pulse oximetry and re-evaluation of patient's condition.  Final Clinical Impression(s) / ED Diagnoses Final diagnoses:  Sepsis, due to unspecified organism, unspecified whether acute organ dysfunction present [facility])    Will need admission once labs result. Signed out to Dr. Eulis Foster pending labs   [place], Analiyah Lechuga, MD 05/02/20 [place], Jonie Burdell, MD 05/02/20 [id number]

## 2020-05-02 NOTE — Telephone Encounter (Addendum)
Pt's wife, Valin Massie (on [place]) called, he has been taken to [facility]. They think he may have a UTI, will be keeping him overnight. Would like a call from the nurse to discuss a sooner appt.

## 2020-05-02 NOTE — Telephone Encounter (Signed)
Returned patient's call, spoke to his wife.  Dr. Jannifer Franklin said virtual would be appropriate for patient with Sarah. Scheduled patient for virtual visit at 05/13/20, she agreed and confirmed.

## 2020-05-02 NOTE — Telephone Encounter (Signed)
This patient did not show for a revisit appoint today, it appears that he is in the emergency room with altered mental status, possible sepsis.  Has developed significant hyponatremia, elevated BUN and creatinine, and anemia.  This appears to be an excused no-show.

## 2020-05-02 NOTE — ED Notes (Signed)
Patient transported to X-ray 

## 2020-05-02 NOTE — ED Notes (Signed)
[facility] called for transport. Spoke to [place].

## 2020-05-02 NOTE — Progress Notes (Signed)
A consult was received from an ED physician for Vancomycin and Cefepime per pharmacy dosing.  The patient's profile has been reviewed for ht/wt/allergies/indication/available labs.    A one time order has been placed for Vancomycin 2gm IV and Cefepime 2gm IV.    Further antibiotics/pharmacy consults should be ordered by admitting physician if indicated.                       Thank you, Everette Rank, PharmD 05/02/2020  5:42 AM

## 2020-05-02 NOTE — ED Notes (Signed)
Attempted to call report again. Nurse will call me back in 10 minutes.

## 2020-05-02 NOTE — ED Triage Notes (Signed)
Pt BIB [facility] from home for AMS and fever. Wife called for pt being off/confused since yesterday. A&Ox4 en route. Endorses incontinence from confusing when attempting to get to the bathroom. Increases urinary frequency, no other urinary sx. Weakness when standing. No other complaints. Started muscle relaxer 3 days ago. No sick contacts. 18 LAC, 566ml NS and 1g tylenol en route.  BP 130/80 HR 96 Temp 102.4 SpO2 94% RA CBG 189

## 2020-05-03 ENCOUNTER — Inpatient Hospital Stay (HOSPITAL_COMMUNITY): Payer: PPO

## 2020-05-03 DIAGNOSIS — R319 Hematuria, unspecified: Secondary | ICD-10-CM | POA: Diagnosis not present

## 2020-05-03 DIAGNOSIS — M4642 Discitis, unspecified, cervical region: Secondary | ICD-10-CM

## 2020-05-03 DIAGNOSIS — E039 Hypothyroidism, unspecified: Secondary | ICD-10-CM

## 2020-05-03 DIAGNOSIS — N179 Acute kidney failure, unspecified: Secondary | ICD-10-CM

## 2020-05-03 DIAGNOSIS — E876 Hypokalemia: Secondary | ICD-10-CM | POA: Diagnosis not present

## 2020-05-03 DIAGNOSIS — R7881 Bacteremia: Secondary | ICD-10-CM | POA: Diagnosis present

## 2020-05-03 DIAGNOSIS — M4643 Discitis, unspecified, cervicothoracic region: Secondary | ICD-10-CM | POA: Diagnosis not present

## 2020-05-03 DIAGNOSIS — B9561 Methicillin susceptible Staphylococcus aureus infection as the cause of diseases classified elsewhere: Secondary | ICD-10-CM | POA: Diagnosis not present

## 2020-05-03 DIAGNOSIS — G35 Multiple sclerosis: Secondary | ICD-10-CM

## 2020-05-03 LAB — COMPREHENSIVE METABOLIC PANEL
ALT: 23 U/L (ref 0–44)
AST: 21 U/L (ref 15–41)
Albumin: 2.2 g/dL — ABNORMAL LOW (ref 3.5–5.0)
Alkaline Phosphatase: 42 U/L (ref 38–126)
Anion gap: 7 (ref 5–15)
BUN: 35 mg/dL — ABNORMAL HIGH (ref 8–23)
CO2: 28 mmol/L (ref 22–32)
Calcium: 8 mg/dL — ABNORMAL LOW (ref 8.9–10.3)
Chloride: 97 mmol/L — ABNORMAL LOW (ref 98–111)
Creatinine, Ser: 1.32 mg/dL — ABNORMAL HIGH (ref 0.61–1.24)
GFR, Estimated: 57 mL/min — ABNORMAL LOW (ref >60)
Glucose, Bld: 117 mg/dL — ABNORMAL HIGH (ref 70–99)
Potassium: 3.2 mmol/L — ABNORMAL LOW (ref 3.5–5.1)
Sodium: 132 mmol/L — ABNORMAL LOW (ref 135–145)
Total Bilirubin: 0.7 mg/dL (ref 0.3–1.2)
Total Protein: 4.9 g/dL — ABNORMAL LOW (ref 6.5–8.1)

## 2020-05-03 LAB — T4, FREE: Free T4: 1.21 ng/dL — ABNORMAL HIGH (ref 0.61–1.12)

## 2020-05-03 LAB — URINE CULTURE: Culture: NO GROWTH

## 2020-05-03 LAB — CBC
HCT: 24.2 % — ABNORMAL LOW (ref 39.0–52.0)
Hemoglobin: 8.3 g/dL — ABNORMAL LOW (ref 13.0–17.0)
MCH: 31.8 pg (ref 26.0–34.0)
MCHC: 34.3 g/dL (ref 30.0–36.0)
MCV: 92.7 fL (ref 80.0–100.0)
Platelets: 132 10*3/uL — ABNORMAL LOW (ref 150–400)
RBC: 2.61 MIL/uL — ABNORMAL LOW (ref 4.22–5.81)
RDW: 13.4 % (ref 11.5–15.5)
WBC: 7.2 10*3/uL (ref 4.0–10.5)
nRBC: 0 % (ref 0.0–0.2)

## 2020-05-03 LAB — IRON AND TIBC
Iron: 8 ug/dL — ABNORMAL LOW (ref 45–182)
Saturation Ratios: 4 % — ABNORMAL LOW (ref 17.9–39.5)
TIBC: 185 ug/dL — ABNORMAL LOW (ref 250–450)
UIBC: 177 ug/dL

## 2020-05-03 LAB — RETICULOCYTES
Immature Retic Fract: 7.6 % (ref 2.3–15.9)
RBC.: 2.65 MIL/uL — ABNORMAL LOW (ref 4.22–5.81)
Retic Count, Absolute: 22.5 10*3/uL (ref 19.0–186.0)
Retic Ct Pct: 0.9 % (ref 0.4–3.1)

## 2020-05-03 LAB — TSH: TSH: 1.721 u[IU]/mL (ref 0.350–4.500)

## 2020-05-03 LAB — VITAMIN B12: Vitamin B-12: 1690 pg/mL — ABNORMAL HIGH (ref 180–914)

## 2020-05-03 LAB — PROCALCITONIN: Procalcitonin: 11.55 ng/mL

## 2020-05-03 LAB — CK: Total CK: 79 U/L (ref 49–397)

## 2020-05-03 LAB — FOLATE: Folate: 13.2 ng/mL (ref >5.9)

## 2020-05-03 LAB — FERRITIN: Ferritin: 277 ng/mL (ref 24–336)

## 2020-05-03 LAB — MAGNESIUM: Magnesium: 2 mg/dL (ref 1.7–2.4)

## 2020-05-03 MED ORDER — ROSUVASTATIN CALCIUM 5 MG PO TABS
10.0000 mg | ORAL_TABLET | Freq: Every day | ORAL | Status: DC
  Administered 2020-05-03 – 2020-05-06 (×4): 10 mg via ORAL
  Filled 2020-05-03 (×4): qty 2

## 2020-05-03 MED ORDER — GADOBUTROL 1 MMOL/ML IV SOLN
10.0000 mL | Freq: Once | INTRAVENOUS | Status: AC | PRN
  Administered 2020-05-03: 10 mL via INTRAVENOUS

## 2020-05-03 MED ORDER — POTASSIUM CHLORIDE CRYS ER 20 MEQ PO TBCR
40.0000 meq | EXTENDED_RELEASE_TABLET | ORAL | Status: AC
  Administered 2020-05-03 (×2): 40 meq via ORAL
  Filled 2020-05-03 (×2): qty 2

## 2020-05-03 NOTE — Consult Note (Signed)
   Providing Compassionate, Quality Care - Together  Neurosurgery Consult  Referring physician: Dr. Maryland Pink Reason for referral: Discitis/osteomyelitis  Chief Complaint: Fever, altered mental status  History of Present Illness: This is a 75 year old male with a history of MS, hypertension, hypothyroidism that started developing pain in his upper back region.  He had tried some muscle relaxers without significant benefit.  He also states that he had been more physically active recently.  He also developed a fever and was brought to the emergency department and had increasing urinary frequency.  He denies any numbness or tingling or weakness in his legs.  He does have chronic MS that he takes an injection twice a week.  He denies any numbness or tingling in his hands.  He denies any illicit drug abuse.  Emergency room work-up revealed upper thoracic and possible lower cervical osteomyelitis/discitis with slight epidural abscess without cord compression.  Therefore I was consulted for evaluation.  Medications: I have reviewed the patient's current medications. Allergies: No Known Allergies  History reviewed. No pertinent family history. Social History:  has no history on file for tobacco use, alcohol use, and drug use.  ROS: 14 point review of systems was obtained  Physical Exam:  Vital signs in last 24 hours: Temp:  [98 F (36.7 C)-98.3 F (36.8 C)] 98 F (36.7 C) (07/25 1814) Pulse Rate:  [58-128] 65 (07/26 0746) Resp:  [11-18] 14 (07/26 0217) BP: (138-182)/(65-125) 153/88 (07/26 0700) SpO2:  [91 %-98 %] 96 % (07/26 0746) PE: A&O x3 PERRLA EOMI Sensory intact to light touch throughout Bilateral upper/lower extremity full strength and symmetric Bilateral patellar reflexes 3/4, no clonus No Hoffmann's   Impression/Assessment:  75 year old male with  1.  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   Thank you for allowing me to participate in this patient's care.  Please do not hesitate to call with questions or concerns.   Elwin Sleight, [facility] Cell: [phone]

## 2020-05-03 NOTE — Progress Notes (Signed)
  Echocardiogram 2D Echocardiogram has been performed.  Martin Clark 05/03/2020, 5:44 PM

## 2020-05-03 NOTE — Progress Notes (Signed)
   Providing Compassionate, Quality Care - Together  NEUROSURGERY PROGRESS NOTE   S: No issues at this time  O: EXAM:  BP 114/64 (BP Location: Right Arm)   Pulse 70   Temp 98.7 F (37.1 C) (Oral)   Resp (!) 24   Ht 5\' 10"  (1.778 m)   Wt 101.6 kg   SpO2 98%   BMI 32.14 kg/m   Awake, alert, oriented  Speech fluent, appropriate  CNs grossly intact  MAE equally  ASSESSMENT:  75 y.o. male with  1.  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  No significant epidural abscess/cord compression noted.  There is some evidence of osteodiscitis.  I recommend conservative medical treatment.  I explained this to the patient and his wife at bedside.  Answered all their questions.  We will sign off at this time.  Please call with any questions.    Thank you for allowing me to participate in this patient's care.  Please do not hesitate to call with questions or concerns.   Elwin Sleight, [facility] Cell: [phone]

## 2020-05-03 NOTE — Plan of Care (Signed)

## 2020-05-03 NOTE — Consult Note (Addendum)
[facility] for Infectious Disease    Date of Admission:  05/02/2020      Total days of antibiotics 1  Cefazolin 4/01 >>               Reason for Consult: MSSA bacteremia, discitis     Referring Provider: Tivis Ringer Primary Care Provider: Holland Commons, FNP    Assessment: Martin Clark is a 75 y.o. male with 1 week history of new worsening upper back pain, fever and confusion.  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 Reatha Armour with NSGY is following - C-spine MRI ordered and pending to fully evaluate.   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 Will need TTE to evaluate valves. Please hold on any central line access for the time being as patient's hemodynamics permit.   No implantable devices or signs of metastatic infection at other sites currently.  Discussed with he and his wife that he will be here a few days while we sterilize his blood, place PICC line and arrange home health antibiotics should he not need surgical intervention.   Hematuria? - acute worsening of creatinine compared to previous on record. ?related to staph bacteremia. CT study negative. Would follow creatinine and urine while inpatient. Urology follow up outpatient if recurs     Plan: 1. Continue cefazolin  2. TTE please  3. Blood cultures to repeat Saturday AM  4. Hold on PICC or central line until next week.  5. Follow MRI C-spine results  6. ESR / CRP in AM if not done for therapeutic monitoring    Active Problems:   Multiple sclerosis (HCC)   Discitis   Hematuria   Hyponatremia   Hypokalemia   AKI (acute kidney injury) ([place])   Hypotension   Hypothyroidism   . levothyroxine  150 mcg Oral Q0600    HPI: Martin Clark is a 75 y.o. male admitted to the hospital  from home with altered mental status and fever after experiencing new onset upper back pain x 1 week prior.   He says this all started with "ditzles" on his legs that he picked at and became infected despite his wife telling him to leave them alone. The back pain came on fairly suddenly out of the blue for him. Pain has eased off with medications he is getting but not resolved. No problems with strength/sensation to upper or lower extremities. He was previously treated for multiple sclerosis with Glatiramer injections but has not been on them in over 6 months due to cost.  No implants or cardiac devices/valves.   Did report some hematuria and clots to urine that was new for him but this has since resolved. Not clear what was causing that. He is not on blood thinners.    Review of Systems: Review of Systems  Constitutional: Positive for chills, fever and malaise/fatigue.  Respiratory: Negative for shortness of breath.   Cardiovascular: Negative for chest pain and leg swelling.  Gastrointestinal: Negative for abdominal pain, nausea and vomiting.  Genitourinary: Positive for dysuria and hematuria.  Musculoskeletal: Positive for back pain. Negative for joint pain.  Skin: Positive for rash.  Neurological: Negative for dizziness, tingling, focal weakness and weakness.    Past Medical History:  Diagnosis Date  . Dyslipidemia   . History of  concussion   . Hypertension   . Hypothyroidism   . Memory disorder   . MS (multiple sclerosis) ([place])   . Obesity   . Restless legs syndrome   . Retinal detachment    right  . RLS (restless legs syndrome) 07/01/2016  . Thyroid cancer [facility])     Social History   Tobacco Use  . Smoking status: Never Smoker  . Smokeless tobacco: Never Used  Substance Use Topics  . Alcohol use: Yes    Alcohol/week: 0.0 standard drinks    Comment: monthly  . Drug use: No    Family History  Problem Relation Age of Onset  . Heart failure Mother   . Heart failure  Father    No Known Allergies  OBJECTIVE: Blood pressure (!) 121/57, pulse 79, temperature 99.3 F (37.4 C), temperature source Oral, resp. rate 20, height $RemoveBe'5\' 10"'YgcfTvgeL$  (1.778 m), weight 101.6 kg, SpO2 95 %.  Physical Exam Vitals and nursing note reviewed.  Constitutional:      Appearance: Normal appearance.     Comments: Sitting up on side of bed. Appears uncomfortable but in no distress. Wife at the bedside.   HENT:     Mouth/Throat:     Mouth: Mucous membranes are moist.     Pharynx: Oropharynx is clear.  Eyes:     General: No scleral icterus.    Pupils: Pupils are equal, round, and reactive to light.  Neck:     Comments: Neck ROM seems OK but limited by T spine  Cardiovascular:     Rate and Rhythm: Normal rate and regular rhythm.     Heart sounds: No murmur heard.   Pulmonary:     Effort: Pulmonary effort is normal.     Breath sounds: Normal breath sounds.  Abdominal:     General: Bowel sounds are normal.     Palpations: Abdomen is soft.  Musculoskeletal:        General: No swelling or tenderness. Normal range of motion.  Skin:    General: Skin is warm and dry.     Capillary Refill: Capillary refill takes less than 2 seconds.     Comments: Small healing open ulcers on legs bilaterally   Neurological:     General: No focal deficit present.     Mental Status: He is alert and oriented to person, place, and time.     Lab Results Lab Results  Component Value Date   WBC 7.2 05/03/2020   HGB 8.3 (L) 05/03/2020   HCT 24.2 (L) 05/03/2020   MCV 92.7 05/03/2020   PLT 132 (L) 05/03/2020    Lab Results  Component Value Date   CREATININE 1.32 (H) 05/03/2020   BUN 35 (H) 05/03/2020   NA 132 (L) 05/03/2020   K 3.2 (L) 05/03/2020   CL 97 (L) 05/03/2020   CO2 28 05/03/2020    Lab Results  Component Value Date   ALT 23 05/03/2020   AST 21 05/03/2020   ALKPHOS 42 05/03/2020   BILITOT 0.7 05/03/2020     Microbiology: Recent Results (from the past 240 hour(s))  Resp  Panel by RT-PCR (Flu A&B, Covid) Nasopharyngeal Swab     Status: None   Collection Time: 05/02/20  6:35 AM   Specimen: Nasopharyngeal Swab; Nasopharyngeal(NP) swabs in vial transport medium  Result Value Ref Range Status   SARS Coronavirus 2 by RT PCR NEGATIVE NEGATIVE Final    Comment: (NOTE) SARS-CoV-2 target nucleic acids are NOT DETECTED.  The SARS-CoV-2 RNA  is generally detectable in upper respiratory specimens during the acute phase of infection. The lowest concentration of SARS-CoV-2 viral copies this assay can detect is 138 copies/mL. A negative result does not preclude SARS-Cov-2 infection and should not be used as the sole basis for treatment or other patient management decisions. A negative result may occur with  improper specimen collection/handling, submission of specimen other than nasopharyngeal swab, presence of viral mutation(s) within the areas targeted by this assay, and inadequate number of viral copies(<138 copies/mL). A negative result must be combined with clinical observations, patient history, and epidemiological information. The expected result is Negative.  Fact Sheet for Patients:  EntrepreneurPulse.com.au  Fact Sheet for Healthcare Providers:  IncredibleEmployment.be  This test is no t yet approved or cleared by the [place] FDA and  has been authorized for detection and/or diagnosis of SARS-CoV-2 by FDA under an Emergency Use Authorization (EUA). This EUA will remain  in effect (meaning this test can be used) for the duration of the COVID-19 declaration under Section 564(b)(1) of the Act, 21 U.S.C.section 360bbb-3(b)(1), unless the authorization is terminated  or revoked sooner.       Influenza A by PCR NEGATIVE NEGATIVE Final   Influenza B by PCR NEGATIVE NEGATIVE Final    Comment: (NOTE) The Xpert Xpress SARS-CoV-2/FLU/RSV plus assay is intended as an aid in the diagnosis of influenza from Nasopharyngeal  swab specimens and should not be used as a sole basis for treatment. Nasal washings and aspirates are unacceptable for Xpert Xpress SARS-CoV-2/FLU/RSV testing.  Fact Sheet for Patients: EntrepreneurPulse.com.au  Fact Sheet for Healthcare Providers: IncredibleEmployment.be  This test is not yet approved or cleared by the [place] FDA and has been authorized for detection and/or diagnosis of SARS-CoV-2 by FDA under an Emergency Use Authorization (EUA). This EUA will remain in effect (meaning this test can be used) for the duration of the COVID-19 declaration under Section 564(b)(1) of the Act, 21 U.S.C. section 360bbb-3(b)(1), unless the authorization is terminated or revoked.  Performed at [facility], [place] [address], [place], [place] [address]   Blood Culture (routine x 2)     Status: Abnormal (Preliminary result)   Collection Time: 05/02/20  6:35 AM   Specimen: BLOOD  Result Value Ref Range Status   Specimen Description   Final    BLOOD RIGHT ANTECUBITAL Performed at [place] [address] [place], [place] [address]    Special Requests   Final    BOTTLES DRAWN AEROBIC AND ANAEROBIC Blood Culture results may not be optimal due to an excessive volume of blood received in culture bottles Performed at [place] [address], [place], [place] [address]    Culture  Setup Time   Final    GRAM POSITIVE COCCI IN CLUSTERS IN BOTH AEROBIC AND ANAEROBIC BOTTLES Organism ID to follow CRITICAL RESULT CALLED TO, READ BACK BY AND VERIFIED WITH: Rockey Situ [facility] 05/02/20 2139 JDW Performed at [facility], [place] [address] [place], [place] [address]    Culture STAPHYLOCOCCUS AUREUS (A)  Final   Report Status PENDING  Incomplete  Blood Culture (routine x 2)     Status: Abnormal (Preliminary result)   Collection Time: 05/02/20  6:35 AM   Specimen: BLOOD  Result Value Ref Range  Status   Specimen Description   Final    BLOOD BLOOD LEFT HAND Performed at [place] [address] [place], [place] [address]    Special  Requests   Final    BOTTLES DRAWN AEROBIC AND ANAEROBIC Blood Culture adequate volume Performed at [place] [address], [place], [place] [address]    Culture  Setup Time   Final    GRAM POSITIVE COCCI IN CLUSTERS IN BOTH AEROBIC AND ANAEROBIC BOTTLES IDENTIFICATION TO FOLLOW Performed at [facility], [place] [address] [place], [place] [address]    Culture STAPHYLOCOCCUS AUREUS (A)  Final   Report Status PENDING  Incomplete  Blood Culture ID Panel (Reflexed)     Status: Abnormal   Collection Time: 05/02/20  6:35 AM  Result Value Ref Range Status   Enterococcus faecalis NOT DETECTED NOT DETECTED Final   Enterococcus Faecium NOT DETECTED NOT DETECTED Final   Listeria monocytogenes NOT DETECTED NOT DETECTED Final   Staphylococcus species DETECTED (A) NOT DETECTED Final    Comment: CRITICAL RESULT CALLED TO, READ BACK BY AND VERIFIED WITH: Rockey Situ [facility] 05/02/20 2139 JDW    Staphylococcus aureus (BCID) DETECTED (A) NOT DETECTED Final    Comment: CRITICAL RESULT CALLED TO, READ BACK BY AND VERIFIED WITH: Rockey Situ [facility] 05/02/20 2139 JDW    Staphylococcus epidermidis NOT DETECTED NOT DETECTED Final   Staphylococcus lugdunensis NOT DETECTED NOT DETECTED Final   Streptococcus species NOT DETECTED NOT DETECTED Final   Streptococcus agalactiae NOT DETECTED NOT DETECTED Final   Streptococcus pneumoniae NOT DETECTED NOT DETECTED Final   Streptococcus pyogenes NOT DETECTED NOT DETECTED Final   A.calcoaceticus-baumannii NOT DETECTED NOT DETECTED Final   Bacteroides fragilis NOT DETECTED NOT DETECTED Final   Enterobacterales NOT DETECTED NOT DETECTED Final   Enterobacter cloacae complex NOT DETECTED NOT DETECTED Final   Escherichia coli NOT DETECTED NOT DETECTED Final   Klebsiella aerogenes NOT  DETECTED NOT DETECTED Final   Klebsiella oxytoca NOT DETECTED NOT DETECTED Final   Klebsiella pneumoniae NOT DETECTED NOT DETECTED Final   Proteus species NOT DETECTED NOT DETECTED Final   Salmonella species NOT DETECTED NOT DETECTED Final   Serratia marcescens NOT DETECTED NOT DETECTED Final   Haemophilus influenzae NOT DETECTED NOT DETECTED Final   Neisseria meningitidis NOT DETECTED NOT DETECTED Final   Pseudomonas aeruginosa NOT DETECTED NOT DETECTED Final   Stenotrophomonas maltophilia NOT DETECTED NOT DETECTED Final   Candida albicans NOT DETECTED NOT DETECTED Final   Candida auris NOT DETECTED NOT DETECTED Final   Candida glabrata NOT DETECTED NOT DETECTED Final   Candida krusei NOT DETECTED NOT DETECTED Final   Candida parapsilosis NOT DETECTED NOT DETECTED Final   Candida tropicalis NOT DETECTED NOT DETECTED Final   Cryptococcus neoformans/gattii NOT DETECTED NOT DETECTED Final   Meth resistant mecA/C and MREJ NOT DETECTED NOT DETECTED Final    Comment: Performed at [facility], [address], [place], [place] [address]  Urine culture     Status: None   Collection Time: 05/02/20  7:52 AM   Specimen: In/Out Cath Urine  Result Value Ref Range Status   Specimen Description   Final    IN/OUT CATH URINE Performed at [place] [address], [place], [place] [address]    Special Requests   Final    NONE Performed at [facility], [place] [address] [place], [place] [address]    Culture   Final    NO GROWTH Performed at [facility], [place] [address] [place], [place] [address]    Report Status 05/03/2020 FINAL  Final  MRSA PCR Screening  Status: None   Collection Time: 05/02/20  6:32 PM   Specimen: Nasopharyngeal  Result Value Ref Range Status   MRSA by PCR NEGATIVE NEGATIVE Final    Comment:        The GeneXpert MRSA Assay (FDA approved for NASAL specimens only), is one component of a comprehensive MRSA  colonization surveillance program. It is not intended to diagnose MRSA infection nor to guide or monitor treatment for MRSA infections. Performed at [facility], [place] [address], [place], [place] [address]     Janene Madeira, MSN, NP-C [facility] for Infectious [place] Cell: [phone] Pager: [phone]  05/03/2020 9:37 AM

## 2020-05-03 NOTE — Progress Notes (Signed)
PROGRESS NOTE    Martin Clark  JKK:[id number] DOB: 1945/03/03 DOA: 05/02/2020 PCP: Holland Commons, FNP    Brief Narrative:  75 y/o male admitted to the hospital with fever and AMS. He was found to have evidence of discitis on imaging of spine and blood cultures positive for MSSA. Neurosurgery following as well as ID.    Assessment & Plan:   Active Problems:   Multiple sclerosis (HCC)   Discitis   Hematuria   Hyponatremia   Hypokalemia   AKI (acute kidney injury) ([place])   Hypotension   Hypothyroidism   MSSA bacteremia with discitis  -MRI of T spine indicates possible infectious process at C7-T1 -MRI of C spine ordered to further characterize extent of infection and to evaluate for epidural abscess -he has 2 out of 2 sets positive for MSSA -Per neurosurgery, plans for IR biopsy if no evidence of abscess, although I suspect with positive blood cultures and organism identification, that it would be reasonable to start antibiotics. -will consult with infectious disease -check 2D echo  Hematuria -CT renal stone study did not show any nephrolithiasis -renal cysts noted on CT -reports that hematuria is now resolved -follow up with urology as outpatient if hematuria recurs -urine culture with no growth  Iron deficiency anemia/AOCD -no evidence of bleeding -will check FOBT -can consider starting iron supplementation once acute infectious process improved -continue to follow -transfuse for hemoglobin <7   History of multiple sclerosis -followed by Dr. Jannifer Franklin -takes glatiramer twice a week  Possible AKI -last labs available for review are from 2011 -creatinine on admission 1.5 -he was taking NSAIDS, lisinopril and HCTZ prior to admission which currently on hold -no obstructive process on imaging -continue IV fluids and monitor  Hypothyroidism -TSH normal -continue on synthroid  HLD.  -continue on statin  Acute metabolic encephalopathy -likely related to fever  and cyclobenzaprine -mental status back to baseline now  HTN -holding lisinopril and HCTZ due to renal function -continue to follow BP  Hypokalemia -replace -Mg normal     DVT prophylaxis: SCDs Start: 05/02/20 1634  Code Status: full code Family Communication: discussed with wife at the bedside Disposition Plan: Status is: Inpatient  Remains inpatient appropriate because:IV treatments appropriate due to intensity of illness or inability to take PO   Dispo: The patient is from: Home              Anticipated d/c is to: Home              Patient currently is not medically stable to d/c.   Difficult to place patient No   Consultants:   Neurosurgery  Procedures:     Antimicrobials:       Subjective: Overall, he is feeling better. Feels that back pain is better.   Objective: Vitals:   05/02/20 2017 05/02/20 2312 05/03/20 0321 05/03/20 0751  BP: (!) 107/58 (!) 110/54 (!) 110/54 (!) 121/57  Pulse: 73 70 75 79  Resp: (!) 22 20 20 20   Temp: 99.2 F (37.3 C) 98.7 F (37.1 C) 99.3 F (37.4 C)   TempSrc: Oral Oral Oral Oral  SpO2: 99% 98% 96% 95%  Weight:      Height:        Intake/Output Summary (Last 24 hours) at 05/03/2020 1018 Last data filed at 05/03/2020 0752 Gross per 24 hour  Intake --  Output 1050 ml  Net -1050 ml   Filed Weights   05/02/20 0537  Weight: 101.6 kg  Examination:  General exam: Appears calm and comfortable  Respiratory system: Clear to auscultation. Respiratory effort normal. Cardiovascular system: S1 & S2 heard, RRR. No JVD, murmurs, rubs, gallops or clicks. No pedal edema. Gastrointestinal system: Abdomen is nondistended, soft and nontender. No organomegaly or masses felt. Normal bowel sounds heard. Central nervous system: Alert and oriented. No focal neurological deficits. Extremities: Symmetric 5 x 5 power. Skin: multiple skin lesions noted on torso and lower extremities, no purulence Psychiatry: Judgement and insight  appear normal. Mood & affect appropriate.     Data Reviewed: I have personally reviewed following labs and imaging studies  CBC: Recent Labs  Lab 05/02/20 0635 05/02/20 0653 05/03/20 0110  WBC 9.4  --  7.2  NEUTROABS 8.3*  --   --   HGB 9.8* 9.2* 8.3*  HCT 29.0* 27.0* 24.2*  MCV 92.7  --  92.7  PLT 168  --  263*   Basic Metabolic Panel: Recent Labs  Lab 05/02/20 0635 05/02/20 0651 05/02/20 0653 05/02/20 1823 05/03/20 0110  NA 129*  --  130* 130* 132*  K 2.7*  --  2.8* 3.3* 3.2*  CL 93*  --  92* 96* 97*  CO2 25  --   --  27 28  GLUCOSE 172*  --  172* 107* 117*  BUN 44*  --  38* 38* 35*  CREATININE 1.53*  --  1.50* 1.50* 1.32*  CALCIUM 8.1*  --   --  7.9* 8.0*  MG  --  1.8  --   --  2.0   GFR: Estimated Creatinine Clearance: 60.4 mL/min (A) (by C-G formula based on SCr of 1.32 mg/dL (H)). Liver Function Tests: Recent Labs  Lab 05/02/20 0635 05/03/20 0110  AST 25 21  ALT 26 23  ALKPHOS 47 42  BILITOT 1.2 0.7  PROT 6.2* 4.9*  ALBUMIN 3.0* 2.2*   No results for input(s): LIPASE, AMYLASE in the last 168 hours. No results for input(s): AMMONIA in the last 168 hours. Coagulation Profile: Recent Labs  Lab 05/02/20 0635  INR 1.2   Cardiac Enzymes: Recent Labs  Lab 05/03/20 0110  CKTOTAL 79   BNP (last 3 results) No results for input(s): PROBNP in the last 8760 hours. HbA1C: No results for input(s): HGBA1C in the last 72 hours. CBG: No results for input(s): GLUCAP in the last 168 hours. Lipid Profile: No results for input(s): CHOL, HDL, LDLCALC, TRIG, CHOLHDL, LDLDIRECT in the last 72 hours. Thyroid Function Tests: Recent Labs    05/03/20 0110  TSH 1.721  FREET4 1.21*   Anemia Panel: Recent Labs    05/03/20 0110  VITAMINB12 1,690*  FOLATE 13.2  FERRITIN 277  TIBC 185*  IRON 8*  RETICCTPCT 0.9   Sepsis Labs: Recent Labs  Lab 05/02/20 7858 05/02/20 0831 05/03/20 0110  PROCALCITON  --   --  11.55  LATICACIDVEN 1.2 1.1  --      Recent Results (from the past 240 hour(s))  Resp Panel by RT-PCR (Flu A&B, Covid) Nasopharyngeal Swab     Status: None   Collection Time: 05/02/20  6:35 AM   Specimen: Nasopharyngeal Swab; Nasopharyngeal(NP) swabs in vial transport medium  Result Value Ref Range Status   SARS Coronavirus 2 by RT PCR NEGATIVE NEGATIVE Final    Comment: (NOTE) SARS-CoV-2 target nucleic acids are NOT DETECTED.  The SARS-CoV-2 RNA is generally detectable in upper respiratory specimens during the acute phase of infection. The lowest concentration of SARS-CoV-2 viral copies this assay can detect is  138 copies/mL. A negative result does not preclude SARS-Cov-2 infection and should not be used as the sole basis for treatment or other patient management decisions. A negative result may occur with  improper specimen collection/handling, submission of specimen other than nasopharyngeal swab, presence of viral mutation(s) within the areas targeted by this assay, and inadequate number of viral copies(<138 copies/mL). A negative result must be combined with clinical observations, patient history, and epidemiological information. The expected result is Negative.  Fact Sheet for Patients:  EntrepreneurPulse.com.au  Fact Sheet for Healthcare Providers:  IncredibleEmployment.be  This test is no t yet approved or cleared by the [place] FDA and  has been authorized for detection and/or diagnosis of SARS-CoV-2 by FDA under an Emergency Use Authorization (EUA). This EUA will remain  in effect (meaning this test can be used) for the duration of the COVID-19 declaration under Section 564(b)(1) of the Act, 21 U.S.C.section 360bbb-3(b)(1), unless the authorization is terminated  or revoked sooner.       Influenza A by PCR NEGATIVE NEGATIVE Final   Influenza B by PCR NEGATIVE NEGATIVE Final    Comment: (NOTE) The Xpert Xpress SARS-CoV-2/FLU/RSV plus assay is intended as an  aid in the diagnosis of influenza from Nasopharyngeal swab specimens and should not be used as a sole basis for treatment. Nasal washings and aspirates are unacceptable for Xpert Xpress SARS-CoV-2/FLU/RSV testing.  Fact Sheet for Patients: EntrepreneurPulse.com.au  Fact Sheet for Healthcare Providers: IncredibleEmployment.be  This test is not yet approved or cleared by the [place] FDA and has been authorized for detection and/or diagnosis of SARS-CoV-2 by FDA under an Emergency Use Authorization (EUA). This EUA will remain in effect (meaning this test can be used) for the duration of the COVID-19 declaration under Section 564(b)(1) of the Act, 21 U.S.C. section 360bbb-3(b)(1), unless the authorization is terminated or revoked.  Performed at [facility], [place] [address] [place], [place] [address]   Blood Culture (routine x 2)     Status: Abnormal (Preliminary result)   Collection Time: 05/02/20  6:35 AM   Specimen: BLOOD  Result Value Ref Range Status   Specimen Description   Final    BLOOD RIGHT ANTECUBITAL Performed at [facility] [address] [place], [place] [address]    Special Requests   Final    BOTTLES DRAWN AEROBIC AND ANAEROBIC Blood Culture results may not be optimal due to an excessive volume of blood received in culture bottles Performed at [place] [address] [place], [place] [address]    Culture  Setup Time   Final    GRAM POSITIVE COCCI IN CLUSTERS IN BOTH AEROBIC AND ANAEROBIC BOTTLES Organism ID to follow CRITICAL RESULT CALLED TO, READ BACK BY AND VERIFIED WITH: Rockey Situ [facility] 05/02/20 2139 JDW Performed at [facility], [place] [address] [place], [place] [address]    Culture STAPHYLOCOCCUS AUREUS (A)  Final   Report Status PENDING  Incomplete  Blood Culture (routine x 2)     Status: Abnormal (Preliminary result)   Collection Time: 05/02/20   6:35 AM   Specimen: BLOOD  Result Value Ref Range Status   Specimen Description   Final    BLOOD BLOOD LEFT HAND Performed at [facility] [place] [address], [place], [place] [address]    Special Requests   Final    BOTTLES DRAWN AEROBIC AND ANAEROBIC Blood Culture adequate volume Performed at [facility]  [address] [place], [place] [address]    Culture  Setup Time   Final    GRAM POSITIVE COCCI IN CLUSTERS IN BOTH AEROBIC AND ANAEROBIC BOTTLES IDENTIFICATION TO FOLLOW Performed at [facility], [place] [address], [place], [place] [address]    Culture STAPHYLOCOCCUS AUREUS (A)  Final   Report Status PENDING  Incomplete  Blood Culture ID Panel (Reflexed)     Status: Abnormal   Collection Time: 05/02/20  6:35 AM  Result Value Ref Range Status   Enterococcus faecalis NOT DETECTED NOT DETECTED Final   Enterococcus Faecium NOT DETECTED NOT DETECTED Final   Listeria monocytogenes NOT DETECTED NOT DETECTED Final   Staphylococcus species DETECTED (A) NOT DETECTED Final    Comment: CRITICAL RESULT CALLED TO, READ BACK BY AND VERIFIED WITH: Rockey Situ [facility] 05/02/20 2139 JDW    Staphylococcus aureus (BCID) DETECTED (A) NOT DETECTED Final    Comment: CRITICAL RESULT CALLED TO, READ BACK BY AND VERIFIED WITH: Rockey Situ [facility] 05/02/20 2139 JDW    Staphylococcus epidermidis NOT DETECTED NOT DETECTED Final   Staphylococcus lugdunensis NOT DETECTED NOT DETECTED Final   Streptococcus species NOT DETECTED NOT DETECTED Final   Streptococcus agalactiae NOT DETECTED NOT DETECTED Final   Streptococcus pneumoniae NOT DETECTED NOT DETECTED Final   Streptococcus pyogenes NOT DETECTED NOT DETECTED Final   A.calcoaceticus-baumannii NOT DETECTED NOT DETECTED Final   Bacteroides fragilis NOT DETECTED NOT DETECTED Final   Enterobacterales NOT DETECTED NOT DETECTED Final   Enterobacter cloacae complex NOT DETECTED NOT DETECTED Final   Escherichia coli NOT DETECTED  NOT DETECTED Final   Klebsiella aerogenes NOT DETECTED NOT DETECTED Final   Klebsiella oxytoca NOT DETECTED NOT DETECTED Final   Klebsiella pneumoniae NOT DETECTED NOT DETECTED Final   Proteus species NOT DETECTED NOT DETECTED Final   Salmonella species NOT DETECTED NOT DETECTED Final   Serratia marcescens NOT DETECTED NOT DETECTED Final   Haemophilus influenzae NOT DETECTED NOT DETECTED Final   Neisseria meningitidis NOT DETECTED NOT DETECTED Final   Pseudomonas aeruginosa NOT DETECTED NOT DETECTED Final   Stenotrophomonas maltophilia NOT DETECTED NOT DETECTED Final   Candida albicans NOT DETECTED NOT DETECTED Final   Candida auris NOT DETECTED NOT DETECTED Final   Candida glabrata NOT DETECTED NOT DETECTED Final   Candida krusei NOT DETECTED NOT DETECTED Final   Candida parapsilosis NOT DETECTED NOT DETECTED Final   Candida tropicalis NOT DETECTED NOT DETECTED Final   Cryptococcus neoformans/gattii NOT DETECTED NOT DETECTED Final   Meth resistant mecA/C and MREJ NOT DETECTED NOT DETECTED Final    Comment: Performed at [facility], [address] [place], [place] [address]  Urine culture     Status: None   Collection Time: 05/02/20  7:52 AM   Specimen: In/Out Cath Urine  Result Value Ref Range Status   Specimen Description   Final    IN/OUT CATH URINE Performed at [facility] [address], [place], [place] [address]    Special Requests   Final    NONE Performed at [facility], [place] [address], [place], [place] [address]    Culture   Final    NO GROWTH Performed at [facility], [place] [address], [place], [place] [address]    Report Status 05/03/2020 FINAL  Final  MRSA PCR Screening     Status: None   Collection Time: 05/02/20  6:32 PM   Specimen: Nasopharyngeal  Result Value Ref Range Status   MRSA  by PCR NEGATIVE NEGATIVE Final    Comment:        The GeneXpert MRSA Assay (FDA approved for NASAL specimens only), is  one component of a comprehensive MRSA colonization surveillance program. It is not intended to diagnose MRSA infection nor to guide or monitor treatment for MRSA infections. Performed at [facility], [place] [address] [place], [place] [address]          Radiology Studies: CT Angio Chest PE W/Cm &/Or Wo Cm  Result Date: 05/02/2020 CLINICAL DATA:  Fever, altered mental status. EXAM: CT ANGIOGRAPHY CHEST WITH CONTRAST TECHNIQUE: Multidetector CT imaging of the chest was performed using the standard protocol during bolus administration of intravenous contrast. Multiplanar CT image reconstructions and MIPs were obtained to evaluate the vascular anatomy. CONTRAST:  128mL OMNIPAQUE IOHEXOL 350 MG/ML SOLN COMPARISON:  December 12, 2009. FINDINGS: Cardiovascular: Satisfactory opacification of the pulmonary arteries to the segmental level. No evidence of pulmonary embolism. Normal heart size. No pericardial effusion. Atherosclerosis of thoracic aorta is noted without aneurysm or dissection. Mediastinum/Nodes: Status post thyroidectomy. The esophagus is unremarkable. No significant adenopathy is noted. There is noted significantly increased prevertebral soft tissue thickening anterior to the upper thoracic spine. Lungs/Pleura: No pneumothorax or pleural effusion is noted. Mild bibasilar subsegmental atelectasis is noted posteriorly. Musculoskeletal: No chest wall abnormality. No acute or significant osseous findings. Review of the MIP images confirms the above findings. IMPRESSION: No definite evidence of pulmonary embolus. Significantly increased prevertebral soft tissue thickening is noted anterior to the upper thoracic spine of unknown etiology. MRI may be performed for further evaluation. Aortic Atherosclerosis (ICD10-I70.0). Electronically Signed   By: Marijo Conception M.D.   On: 05/02/2020 09:42   MR THORACIC SPINE W WO CONTRAST  Result Date: 05/02/2020 CLINICAL DATA:  Intervertebral disc  disorder EXAM: MRI THORACIC AND LUMBAR SPINE WITHOUT AND WITH CONTRAST TECHNIQUE: Multiplanar and multiecho pulse sequences of the thoracic and lumbar spine were obtained without and with intravenous contrast. CONTRAST:  19mL GADAVIST GADOBUTROL 1 MMOL/ML IV SOLN COMPARISON:  CT chest May 02, 2020. FINDINGS: Motion limited evaluation.  Within this limitation: MRI THORACIC SPINE FINDINGS Alignment:  Normal alignment. Vertebrae: Vertebral body heights are maintained. No specific evidence of acute fracture, discitis/osteomyelitis, or suspicious bone lesion in the thoracic spine. On the counter sequence there is T1 hypointensity of the lower cervical vertebral bodies, indeterminate but potentially represent edema versus degenerative change. Cord: Limited evaluation due to motion without definite cord signal abnormality. Paraspinal and other soft tissues: Abnormal prevertebral and paraspinal edema and enhancement at the cervicothoracic junction. On the counter sequence, there is suggestion of this process extending superiorly with prevertebral edema in the cervical spine Susspected small fluid collection ventrally at C7-T1 and possible thin ventral enhancement of the epidural canal at this level (series 33, image 6). Disc levels: Central disc protrusion at C7-T1 is partially imaged on axial sequences and contacts and deforms the ventral cord without significant canal stenosis. Small central disc protrusion at T6-T7 and right eccentric disc protrusion at T7-T8 the ventral cord without significant canal stenosis. Posterior disc bulging at T10-T11 with left eccentric annular fissure, bilateral facet hypertrophy, and ligamentum flavum thickening. Resulting mild to moderate canal stenosis and moderate right and mild left foraminal stenosis. MRI LUMBAR SPINE FINDINGS Segmentation:  Transitional lumbosacral anatomy with lumbarized S1. Alignment:  No substantial sagittal subluxation. Vertebrae: Vertebral body heights are  maintained. No specific evidence of acute fracture, discitis/osteomyelitis, or suspicious bone lesion. Degenerative Schmorl's nodes at  multiple levels. Discogenic/degenerative endplate signal changes at multiple levels. Conus medullaris: Extends to the T12-L1 level and appears normal. Paraspinal and other soft tissues: Negative Disc levels: T12-L1: Right eccentric disc bulging and bilateral facet hypertrophy and ligamentum flavum thickening. Mild right subarticular recess stenosis and mild right foraminal stenosis. No significant central canal or left foraminal stenosis. L1-L2: Broad disc bulge, bilateral facet hypertrophy and ligamentum flavum thickening. Resulting moderate to severe canal stenosis and mild left foraminal stenosis. L2-L3: Broad disc bulge, bilateral facet hypertrophy, and ligamentum flavum thickening. Mild left foraminal stenosis without significant canal or right foraminal stenosis. L3-L4: Disc height loss and desiccation. Disc bulge and endplate spurring. Facet hypertrophy and ligamentum flavum thickening. Mild left foraminal stenosis. No significant central canal or right foraminal stenosis. Mild right subarticular recess narrowing. L4-L5: Endplate spurring. Bilateral facet hypertrophy. Mild to moderate bilateral foraminal stenosis. No significant central canal stenosis. L5-S1: Endplate spurring. Bilateral facet hypertrophy. Moderate bilateral foraminal stenosis. No significant canal stenosis. S1-S2: Bilateral facet hypertrophy with mild left foraminal stenosis. IMPRESSION: MRI thoracic spine: 1. Abnormal prevertebral and paraspinal edema and enhancement at the cervicothoracic junction which is partially imaged on this study. Suspected small pervertbral fluid collection at C7-T1 and possible thin ventral enhancement of the epidural canal at this level. On the counter sequence, this process appears to extend cranially with prevertebral edema in the cervical spine and possible abnormal marrow  edema of the lower cervical vertebral bodies versus degenerative change. Findings are concerning for infection, but are incompletely characterized. Recommend MRI of the cervical spine with contrast to further characterize and evaluate for discitis/osteomyelitis. 2. At T10-T11 there is mild to moderate canal stenosis and moderate right and mild left foraminal stenosis. 3. Small disc protrusions at T6-T7 and T7-T8 flatten the ventral cord without significant canal stenosis. 4. Central disc protrusion at C7-T1 is partially imaged on axial sequences and contacts and deforms the ventral cord without significant canal stenosis. The recommended MRI of the cervical spine can further characterize. MRI lumbar spine: 1. Transitional lumbosacral anatomy with lumbarized S1. 2. Moderate to severe canal stenosis at L2-L3. 3. Bilateral moderate foraminal stenosis at L5-S1 and mild to moderate foraminal stenosis at L4-L5. Mild foraminal stenosis on the left at L1-L2 and L3-L4 and the right at T12-L1. Findings and recommendations discussed with Dr. Eulis Foster via telephone at 1:28 PM Electronically Signed   By: Margaretha Sheffield MD   On: 05/02/2020 13:35   MR Lumbar Spine W Wo Contrast  Result Date: 05/02/2020 CLINICAL DATA:  Intervertebral disc disorder EXAM: MRI THORACIC AND LUMBAR SPINE WITHOUT AND WITH CONTRAST TECHNIQUE: Multiplanar and multiecho pulse sequences of the thoracic and lumbar spine were obtained without and with intravenous contrast. CONTRAST:  61mL GADAVIST GADOBUTROL 1 MMOL/ML IV SOLN COMPARISON:  CT chest May 02, 2020. FINDINGS: Motion limited evaluation.  Within this limitation: MRI THORACIC SPINE FINDINGS Alignment:  Normal alignment. Vertebrae: Vertebral body heights are maintained. No specific evidence of acute fracture, discitis/osteomyelitis, or suspicious bone lesion in the thoracic spine. On the counter sequence there is T1 hypointensity of the lower cervical vertebral bodies, indeterminate but  potentially represent edema versus degenerative change. Cord: Limited evaluation due to motion without definite cord signal abnormality. Paraspinal and other soft tissues: Abnormal prevertebral and paraspinal edema and enhancement at the cervicothoracic junction. On the counter sequence, there is suggestion of this process extending superiorly with prevertebral edema in the cervical spine Susspected small fluid collection ventrally at C7-T1 and possible thin ventral enhancement of the epidural canal  at this level (series 33, image 6). Disc levels: Central disc protrusion at C7-T1 is partially imaged on axial sequences and contacts and deforms the ventral cord without significant canal stenosis. Small central disc protrusion at T6-T7 and right eccentric disc protrusion at T7-T8 the ventral cord without significant canal stenosis. Posterior disc bulging at T10-T11 with left eccentric annular fissure, bilateral facet hypertrophy, and ligamentum flavum thickening. Resulting mild to moderate canal stenosis and moderate right and mild left foraminal stenosis. MRI LUMBAR SPINE FINDINGS Segmentation:  Transitional lumbosacral anatomy with lumbarized S1. Alignment:  No substantial sagittal subluxation. Vertebrae: Vertebral body heights are maintained. No specific evidence of acute fracture, discitis/osteomyelitis, or suspicious bone lesion. Degenerative Schmorl's nodes at multiple levels. Discogenic/degenerative endplate signal changes at multiple levels. Conus medullaris: Extends to the T12-L1 level and appears normal. Paraspinal and other soft tissues: Negative Disc levels: T12-L1: Right eccentric disc bulging and bilateral facet hypertrophy and ligamentum flavum thickening. Mild right subarticular recess stenosis and mild right foraminal stenosis. No significant central canal or left foraminal stenosis. L1-L2: Broad disc bulge, bilateral facet hypertrophy and ligamentum flavum thickening. Resulting moderate to severe  canal stenosis and mild left foraminal stenosis. L2-L3: Broad disc bulge, bilateral facet hypertrophy, and ligamentum flavum thickening. Mild left foraminal stenosis without significant canal or right foraminal stenosis. L3-L4: Disc height loss and desiccation. Disc bulge and endplate spurring. Facet hypertrophy and ligamentum flavum thickening. Mild left foraminal stenosis. No significant central canal or right foraminal stenosis. Mild right subarticular recess narrowing. L4-L5: Endplate spurring. Bilateral facet hypertrophy. Mild to moderate bilateral foraminal stenosis. No significant central canal stenosis. L5-S1: Endplate spurring. Bilateral facet hypertrophy. Moderate bilateral foraminal stenosis. No significant canal stenosis. S1-S2: Bilateral facet hypertrophy with mild left foraminal stenosis. IMPRESSION: MRI thoracic spine: 1. Abnormal prevertebral and paraspinal edema and enhancement at the cervicothoracic junction which is partially imaged on this study. Suspected small pervertbral fluid collection at C7-T1 and possible thin ventral enhancement of the epidural canal at this level. On the counter sequence, this process appears to extend cranially with prevertebral edema in the cervical spine and possible abnormal marrow edema of the lower cervical vertebral bodies versus degenerative change. Findings are concerning for infection, but are incompletely characterized. Recommend MRI of the cervical spine with contrast to further characterize and evaluate for discitis/osteomyelitis. 2. At T10-T11 there is mild to moderate canal stenosis and moderate right and mild left foraminal stenosis. 3. Small disc protrusions at T6-T7 and T7-T8 flatten the ventral cord without significant canal stenosis. 4. Central disc protrusion at C7-T1 is partially imaged on axial sequences and contacts and deforms the ventral cord without significant canal stenosis. The recommended MRI of the cervical spine can further characterize.  MRI lumbar spine: 1. Transitional lumbosacral anatomy with lumbarized S1. 2. Moderate to severe canal stenosis at L2-L3. 3. Bilateral moderate foraminal stenosis at L5-S1 and mild to moderate foraminal stenosis at L4-L5. Mild foraminal stenosis on the left at L1-L2 and L3-L4 and the right at T12-L1. Findings and recommendations discussed with Dr. Eulis Foster via telephone at 1:28 PM Electronically Signed   By: Margaretha Sheffield MD   On: 05/02/2020 13:35   DG Chest Port 1 View  Result Date: 05/02/2020 CLINICAL DATA:  Cough, fever EXAM: PORTABLE CHEST 1 VIEW COMPARISON:  None. FINDINGS: Mild bibasilar atelectasis. Lungs are otherwise clear. No pneumothorax or pleural effusion. Cardiac size within normal limits. Pulmonary vascularity is normal. No acute bone abnormality. IMPRESSION: No active disease. Electronically Signed   By: Fidela Salisbury MD  On: 05/02/2020 06:06   CT Renal Stone Study  Result Date: 05/02/2020 CLINICAL DATA:  Fever and altered mental status. Increased urinary frequency. EXAM: CT ABDOMEN AND PELVIS WITHOUT CONTRAST TECHNIQUE: Multidetector CT imaging of the abdomen and pelvis was performed following the standard protocol without IV contrast. COMPARISON:  CT abdomen pelvis dated September 03, 2011. FINDINGS: Lower chest: No acute abnormality. Dependent subsegmental atelectasis in both lower lobes. Hypodense blood pool relative to myocardium, suggestive of anemia. Hepatobiliary: Multiple hepatic cysts are stable or slightly increased in size since 2013. Unchanged 3.1 cm hypodense lesion in the inferior right hepatic lobe, previously characterized as a hemangioma. The gallbladder is unremarkable. No biliary dilatation. Pancreas: Unremarkable. No pancreatic ductal dilatation or surrounding inflammatory changes. Spleen: Normal in size without focal abnormality. Adrenals/Urinary Tract: The adrenal glands and right kidney are unremarkable. Slight interval increase in size of the large left renal simple  cyst, currently measuring 9.1 cm, previously 7.9 cm. No renal calculi or hydronephrosis. The bladder is unremarkable for the degree of distention. Stomach/Bowel: Stomach is within normal limits. Appendix appears normal. No evidence of bowel wall thickening, distention, or inflammatory changes. Vascular/Lymphatic: Aortic atherosclerosis. No enlarged abdominal or pelvic lymph nodes. Reproductive: Prostate is unremarkable. Other: Unchanged small fat containing left inguinal hernia. No free fluid or pneumoperitoneum. Musculoskeletal: No acute or significant osseous findings. Old right-sided rib fractures. IMPRESSION: 1. No acute intra-abdominal process. 2. Aortic Atherosclerosis (ICD10-I70.0). Electronically Signed   By: Titus Dubin M.D.   On: 05/02/2020 09:51        Scheduled Meds: . levothyroxine  150 mcg Oral Q0600   Continuous Infusions: .  ceFAZolin (ANCEF) IV 2 g (05/03/20 0652)     LOS: 1 day    Time spent: 62mins    Kathie Dike, MD [facility]   If 7PM-7AM, please contact night-coverage [email or web address]  05/03/2020, 10:18 AM

## 2020-05-04 DIAGNOSIS — E876 Hypokalemia: Secondary | ICD-10-CM | POA: Diagnosis not present

## 2020-05-04 DIAGNOSIS — M4643 Discitis, unspecified, cervicothoracic region: Secondary | ICD-10-CM | POA: Diagnosis not present

## 2020-05-04 DIAGNOSIS — N179 Acute kidney failure, unspecified: Secondary | ICD-10-CM | POA: Diagnosis not present

## 2020-05-04 DIAGNOSIS — R7881 Bacteremia: Secondary | ICD-10-CM | POA: Diagnosis not present

## 2020-05-04 DIAGNOSIS — E871 Hypo-osmolality and hyponatremia: Secondary | ICD-10-CM

## 2020-05-04 LAB — COMPREHENSIVE METABOLIC PANEL
ALT: 28 U/L (ref 0–44)
AST: 36 U/L (ref 15–41)
Albumin: 2.6 g/dL — ABNORMAL LOW (ref 3.5–5.0)
Alkaline Phosphatase: 50 U/L (ref 38–126)
Anion gap: 8 (ref 5–15)
BUN: 28 mg/dL — ABNORMAL HIGH (ref 8–23)
CO2: 27 mmol/L (ref 22–32)
Calcium: 8.5 mg/dL — ABNORMAL LOW (ref 8.9–10.3)
Chloride: 98 mmol/L (ref 98–111)
Creatinine, Ser: 1.14 mg/dL (ref 0.61–1.24)
GFR, Estimated: >60 mL/min (ref >60)
Glucose, Bld: 105 mg/dL — ABNORMAL HIGH (ref 70–99)
Potassium: 3.5 mmol/L (ref 3.5–5.1)
Sodium: 133 mmol/L — ABNORMAL LOW (ref 135–145)
Total Bilirubin: 0.6 mg/dL (ref 0.3–1.2)
Total Protein: 5.9 g/dL — ABNORMAL LOW (ref 6.5–8.1)

## 2020-05-04 LAB — ECHOCARDIOGRAM COMPLETE
Area-P 1/2: 3.08 cm2
Height: 70 in
S' Lateral: 3.4 cm
Weight: 3584 oz

## 2020-05-04 LAB — CULTURE, BLOOD (ROUTINE X 2): Special Requests: ADEQUATE

## 2020-05-04 LAB — PROCALCITONIN: Procalcitonin: 5.62 ng/mL

## 2020-05-04 LAB — OCCULT BLOOD X 1 CARD TO LAB, STOOL: Fecal Occult Bld: NEGATIVE

## 2020-05-04 LAB — C-REACTIVE PROTEIN: CRP: 31.8 mg/dL — ABNORMAL HIGH (ref <1.0)

## 2020-05-04 LAB — CBC
HCT: 28.2 % — ABNORMAL LOW (ref 39.0–52.0)
Hemoglobin: 9.3 g/dL — ABNORMAL LOW (ref 13.0–17.0)
MCH: 31.1 pg (ref 26.0–34.0)
MCHC: 33 g/dL (ref 30.0–36.0)
MCV: 94.3 fL (ref 80.0–100.0)
Platelets: 211 10*3/uL (ref 150–400)
RBC: 2.99 MIL/uL — ABNORMAL LOW (ref 4.22–5.81)
RDW: 13.4 % (ref 11.5–15.5)
WBC: 9 10*3/uL (ref 4.0–10.5)
nRBC: 0 % (ref 0.0–0.2)

## 2020-05-04 LAB — SEDIMENTATION RATE: Sed Rate: 120 mm/hr — ABNORMAL HIGH (ref 0–16)

## 2020-05-04 MED ORDER — TRAMADOL HCL 50 MG PO TABS
50.0000 mg | ORAL_TABLET | Freq: Four times a day (QID) | ORAL | Status: DC | PRN
  Administered 2020-05-05 – 2020-05-06 (×2): 50 mg via ORAL
  Filled 2020-05-04 (×2): qty 1

## 2020-05-04 NOTE — Progress Notes (Signed)
PROGRESS NOTE    Martin Clark  XFG:[id number] DOB: Jul 22, 1945 DOA: 05/02/2020 PCP: Holland Commons, FNP    Brief Narrative:  75 y/o male admitted to the hospital with fever and AMS. He was found to have evidence of discitis on imaging of spine and blood cultures positive for MSSA. Neurosurgery following as well as ID.    Assessment & Plan:   Active Problems:   Multiple sclerosis (HCC)   Discitis   Hematuria   Hyponatremia   Hypokalemia   AKI (acute kidney injury) ([place])   Hypotension   Hypothyroidism   Bacteremia due to Staphylococcus aureus   MSSA bacteremia with discitis  -MRI of T spine indicates possible infectious process at C7-T1 -MRI of C spine without any evidence of epidural abscess -he has 2 out of 2 sets positive for MSSA -repeat cultures sent 4/2 -Per neurosurgery, no operative management indicated -appreciate infectious disease assistance -2D echo without signs of vegetations -will need prolonged course of cefazolin  Hematuria -CT renal stone study did not show any nephrolithiasis -renal cysts noted on CT -reports that hematuria is now resolved -follow up with urology as outpatient if hematuria recurs -urine culture with no growth  Iron deficiency anemia/AOCD -no evidence of bleeding -FOBT negative -can consider starting iron supplementation once acute infectious process improved -continue to follow -transfuse for hemoglobin <7   History of multiple sclerosis -followed by Dr. Jannifer Franklin -takes glatiramer twice a week  AKI -last labs available for review are from 2011 -creatinine on admission 1.5 -he was taking NSAIDS, lisinopril and HCTZ prior to admission which currently on hold -no obstructive process on imaging -creatinine has normalized with IV fluids  Hypothyroidism -TSH normal -continue on synthroid  HLD.  -continue on statin  Acute metabolic encephalopathy -likely related to fever and cyclobenzaprine -mental status back to  baseline now  HTN -holding lisinopril and HCTZ due to renal function -continue to follow BP  Hypokalemia -replaced -Mg normal     DVT prophylaxis: SCDs Start: 05/02/20 1634  Code Status: full code Family Communication: discussed with patient Disposition Plan: Status is: Inpatient  Remains inpatient appropriate because:IV treatments appropriate due to intensity of illness or inability to take PO   Dispo: The patient is from: Home              Anticipated d/c is to: Home              Patient currently is not medically stable to d/c.   Difficult to place patient No   Consultants:   Neurosurgery  Infectious disease  Procedures:     Antimicrobials:    cefazolin 4/1>   Subjective: Still has some soreness in his back, but overall feeling better  Objective: Vitals:   05/03/20 2321 05/04/20 0429 05/04/20 0731 05/04/20 1130  BP: 124/62 127/69 140/78 127/63  Pulse: 74 74 73 75  Resp: 20 15 14 17   Temp: 99.4 F (37.4 C) 99.6 F (37.6 C)  99 F (37.2 C)  TempSrc: Oral Oral Axillary Axillary  SpO2: 99% 100% 98% 96%  Weight:      Height:        Intake/Output Summary (Last 24 hours) at 05/04/2020 1356 Last data filed at 05/04/2020 1346 Gross per 24 hour  Intake 1440 ml  Output 3100 ml  Net -1660 ml   Filed Weights   05/02/20 0537  Weight: 101.6 kg    Examination:  General exam: Appears calm and comfortable  Respiratory system: Clear to auscultation. Respiratory effort  normal. Cardiovascular system: S1 & S2 heard, RRR. No JVD, murmurs, rubs, gallops or clicks. No pedal edema. Gastrointestinal system: Abdomen is nondistended, soft and nontender. No organomegaly or masses felt. Normal bowel sounds heard. Central nervous system: Alert and oriented. No focal neurological deficits. Extremities: Symmetric 5 x 5 power. Skin: multiple skin lesions noted on torso and lower extremities, no purulence Psychiatry: Judgement and insight appear normal. Mood & affect  appropriate.     Data Reviewed: I have personally reviewed following labs and imaging studies  CBC: Recent Labs  Lab 05/02/20 0635 05/02/20 0653 05/03/20 0110 05/04/20 0454  WBC 9.4  --  7.2 9.0  NEUTROABS 8.3*  --   --   --   HGB 9.8* 9.2* 8.3* 9.3*  HCT 29.0* 27.0* 24.2* 28.2*  MCV 92.7  --  92.7 94.3  PLT 168  --  132* 767   Basic Metabolic Panel: Recent Labs  Lab 05/02/20 0635 05/02/20 0651 05/02/20 0653 05/02/20 1823 05/03/20 0110 05/04/20 0454  NA 129*  --  130* 130* 132* 133*  K 2.7*  --  2.8* 3.3* 3.2* 3.5  CL 93*  --  92* 96* 97* 98  CO2 25  --   --  27 28 27   GLUCOSE 172*  --  172* 107* 117* 105*  BUN 44*  --  38* 38* 35* 28*  CREATININE 1.53*  --  1.50* 1.50* 1.32* 1.14  CALCIUM 8.1*  --   --  7.9* 8.0* 8.5*  MG  --  1.8  --   --  2.0  --    GFR: Estimated Creatinine Clearance: 69.9 mL/min (by C-G formula based on SCr of 1.14 mg/dL). Liver Function Tests: Recent Labs  Lab 05/02/20 0635 05/03/20 0110 05/04/20 0454  AST 25 21 36  ALT 26 23 28   ALKPHOS 47 42 50  BILITOT 1.2 0.7 0.6  PROT 6.2* 4.9* 5.9*  ALBUMIN 3.0* 2.2* 2.6*   No results for input(s): LIPASE, AMYLASE in the last 168 hours. No results for input(s): AMMONIA in the last 168 hours. Coagulation Profile: Recent Labs  Lab 05/02/20 0635  INR 1.2   Cardiac Enzymes: Recent Labs  Lab 05/03/20 0110  CKTOTAL 79   BNP (last 3 results) No results for input(s): PROBNP in the last 8760 hours. HbA1C: No results for input(s): HGBA1C in the last 72 hours. CBG: No results for input(s): GLUCAP in the last 168 hours. Lipid Profile: No results for input(s): CHOL, HDL, LDLCALC, TRIG, CHOLHDL, LDLDIRECT in the last 72 hours. Thyroid Function Tests: Recent Labs    05/03/20 0110  TSH 1.721  FREET4 1.21*   Anemia Panel: Recent Labs    05/03/20 0110  VITAMINB12 1,690*  FOLATE 13.2  FERRITIN 277  TIBC 185*  IRON 8*  RETICCTPCT 0.9   Sepsis Labs: Recent Labs  Lab 05/02/20 0635  05/02/20 0831 05/03/20 0110 05/04/20 0454  PROCALCITON  --   --  11.55 5.62  LATICACIDVEN 1.2 1.1  --   --     Recent Results (from the past 240 hour(s))  Resp Panel by RT-PCR (Flu A&B, Covid) Nasopharyngeal Swab     Status: None   Collection Time: 05/02/20  6:35 AM   Specimen: Nasopharyngeal Swab; Nasopharyngeal(NP) swabs in vial transport medium  Result Value Ref Range Status   SARS Coronavirus 2 by RT PCR NEGATIVE NEGATIVE Final    Comment: (NOTE) SARS-CoV-2 target nucleic acids are NOT DETECTED.  The SARS-CoV-2 RNA is generally detectable in upper respiratory  specimens during the acute phase of infection. The lowest concentration of SARS-CoV-2 viral copies this assay can detect is 138 copies/mL. A negative result does not preclude SARS-Cov-2 infection and should not be used as the sole basis for treatment or other patient management decisions. A negative result may occur with  improper specimen collection/handling, submission of specimen other than nasopharyngeal swab, presence of viral mutation(s) within the areas targeted by this assay, and inadequate number of viral copies(<138 copies/mL). A negative result must be combined with clinical observations, patient history, and epidemiological information. The expected result is Negative.  Fact Sheet for Patients:  EntrepreneurPulse.com.au  Fact Sheet for Healthcare Providers:  IncredibleEmployment.be  This test is no t yet approved or cleared by the [place] FDA and  has been authorized for detection and/or diagnosis of SARS-CoV-2 by FDA under an Emergency Use Authorization (EUA). This EUA will remain  in effect (meaning this test can be used) for the duration of the COVID-19 declaration under Section 564(b)(1) of the Act, 21 U.S.C.section 360bbb-3(b)(1), unless the authorization is terminated  or revoked sooner.       Influenza A by PCR NEGATIVE NEGATIVE Final   Influenza B by  PCR NEGATIVE NEGATIVE Final    Comment: (NOTE) The Xpert Xpress SARS-CoV-2/FLU/RSV plus assay is intended as an aid in the diagnosis of influenza from Nasopharyngeal swab specimens and should not be used as a sole basis for treatment. Nasal washings and aspirates are unacceptable for Xpert Xpress SARS-CoV-2/FLU/RSV testing.  Fact Sheet for Patients: EntrepreneurPulse.com.au  Fact Sheet for Healthcare Providers: IncredibleEmployment.be  This test is not yet approved or cleared by the [place] FDA and has been authorized for detection and/or diagnosis of SARS-CoV-2 by FDA under an Emergency Use Authorization (EUA). This EUA will remain in effect (meaning this test can be used) for the duration of the COVID-19 declaration under Section 564(b)(1) of the Act, 21 U.S.C. section 360bbb-3(b)(1), unless the authorization is terminated or revoked.  Performed at [facility], [place] [address], [place], [place] [address]   Blood Culture (routine x 2)     Status: Abnormal   Collection Time: 05/02/20  6:35 AM   Specimen: BLOOD  Result Value Ref Range Status   Specimen Description   Final    BLOOD RIGHT ANTECUBITAL Performed at [facility] [address], [place], [place] [address]    Special Requests   Final    BOTTLES DRAWN AEROBIC AND ANAEROBIC Blood Culture results may not be optimal due to an excessive volume of blood received in culture bottles Performed at [facility] [address], [place], [place] [address]    Culture  Setup Time   Final    GRAM POSITIVE COCCI IN CLUSTERS IN BOTH AEROBIC AND ANAEROBIC BOTTLES CRITICAL RESULT CALLED TO, READ BACK BY AND VERIFIED WITH: Rockey Situ [facility] 05/02/20 2139 JDW Performed at [facility], [place] [address] [place],  [address]    Culture STAPHYLOCOCCUS AUREUS (A)  Final   Report Status 05/04/2020 FINAL  Final   Organism ID, Bacteria  STAPHYLOCOCCUS AUREUS  Final      Susceptibility   Staphylococcus aureus - MIC*    CIPROFLOXACIN <=0.5 SENSITIVE Sensitive     ERYTHROMYCIN <=0.25 SENSITIVE Sensitive     GENTAMICIN <=0.5 SENSITIVE Sensitive     OXACILLIN 0.5 SENSITIVE Sensitive     TETRACYCLINE >=16 RESISTANT Resistant     VANCOMYCIN <=0.5 SENSITIVE Sensitive     TRIMETH/SULFA <=10  SENSITIVE Sensitive     CLINDAMYCIN <=0.25 SENSITIVE Sensitive     RIFAMPIN <=0.5 SENSITIVE Sensitive     Inducible Clindamycin NEGATIVE Sensitive     * STAPHYLOCOCCUS AUREUS  Blood Culture (routine x 2)     Status: Abnormal   Collection Time: 05/02/20  6:35 AM   Specimen: BLOOD  Result Value Ref Range Status   Specimen Description   Final    BLOOD BLOOD LEFT HAND Performed at [facility] [address], [place], [place] [address]    Special Requests   Final    BOTTLES DRAWN AEROBIC AND ANAEROBIC Blood Culture adequate volume Performed at [facility] [address] [place], [place] [address]    Culture  Setup Time   Final    GRAM POSITIVE COCCI IN CLUSTERS IN BOTH AEROBIC AND ANAEROBIC BOTTLES CRITICAL VALUE NOTED.  VALUE IS CONSISTENT WITH PREVIOUSLY REPORTED AND CALLED VALUE.    Culture (A)  Final    STAPHYLOCOCCUS AUREUS SUSCEPTIBILITIES PERFORMED ON PREVIOUS CULTURE WITHIN THE LAST 5 DAYS. Performed at [facility], [place] [address], [place],  [address]    Report Status 05/04/2020 FINAL  Final  Blood Culture ID Panel (Reflexed)     Status: Abnormal   Collection Time: 05/02/20  6:35 AM  Result Value Ref Range Status   Enterococcus faecalis NOT DETECTED NOT DETECTED Final   Enterococcus Faecium NOT DETECTED NOT DETECTED Final   Listeria monocytogenes NOT DETECTED NOT DETECTED Final   Staphylococcus species DETECTED (A) NOT DETECTED Final    Comment: CRITICAL RESULT CALLED TO, READ BACK BY AND VERIFIED WITH: Rockey Situ [facility] 05/02/20 2139 JDW    Staphylococcus aureus (BCID)  DETECTED (A) NOT DETECTED Final    Comment: CRITICAL RESULT CALLED TO, READ BACK BY AND VERIFIED WITH: Rockey Situ [facility] 05/02/20 2139 JDW    Staphylococcus epidermidis NOT DETECTED NOT DETECTED Final   Staphylococcus lugdunensis NOT DETECTED NOT DETECTED Final   Streptococcus species NOT DETECTED NOT DETECTED Final   Streptococcus agalactiae NOT DETECTED NOT DETECTED Final   Streptococcus pneumoniae NOT DETECTED NOT DETECTED Final   Streptococcus pyogenes NOT DETECTED NOT DETECTED Final   A.calcoaceticus-baumannii NOT DETECTED NOT DETECTED Final   Bacteroides fragilis NOT DETECTED NOT DETECTED Final   Enterobacterales NOT DETECTED NOT DETECTED Final   Enterobacter cloacae complex NOT DETECTED NOT DETECTED Final   Escherichia coli NOT DETECTED NOT DETECTED Final   Klebsiella aerogenes NOT DETECTED NOT DETECTED Final   Klebsiella oxytoca NOT DETECTED NOT DETECTED Final   Klebsiella pneumoniae NOT DETECTED NOT DETECTED Final   Proteus species NOT DETECTED NOT DETECTED Final   Salmonella species NOT DETECTED NOT DETECTED Final   Serratia marcescens NOT DETECTED NOT DETECTED Final   Haemophilus influenzae NOT DETECTED NOT DETECTED Final   Neisseria meningitidis NOT DETECTED NOT DETECTED Final   Pseudomonas aeruginosa NOT DETECTED NOT DETECTED Final   Stenotrophomonas maltophilia NOT DETECTED NOT DETECTED Final   Candida albicans NOT DETECTED NOT DETECTED Final   Candida auris NOT DETECTED NOT DETECTED Final   Candida glabrata NOT DETECTED NOT DETECTED Final   Candida krusei NOT DETECTED NOT DETECTED Final   Candida parapsilosis NOT DETECTED NOT DETECTED Final   Candida tropicalis NOT DETECTED NOT DETECTED Final   Cryptococcus neoformans/gattii NOT DETECTED NOT DETECTED Final   Meth resistant mecA/C and MREJ NOT DETECTED NOT DETECTED Final    Comment: Performed at [facility], [address], [place],  [address]  Urine culture     Status: None   Collection Time: 05/02/20  7:52  AM   Specimen: In/Out Cath Urine  Result Value Ref Range Status   Specimen Description   Final    IN/OUT CATH URINE Performed at [facility] [address], [place], [place] [address]    Special Requests   Final    NONE Performed at [facility], [place] [address], [place], [place] [address]    Culture   Final    NO GROWTH Performed at [facility], [place] [address] [place], [place] [address]    Report Status 05/03/2020 FINAL  Final  MRSA PCR Screening     Status: None   Collection Time: 05/02/20  6:32 PM   Specimen: Nasopharyngeal  Result Value Ref Range Status   MRSA by PCR NEGATIVE NEGATIVE Final    Comment:        The GeneXpert MRSA Assay (FDA approved for NASAL specimens only), is one component of a comprehensive MRSA colonization surveillance program. It is not intended to diagnose MRSA infection nor to guide or monitor treatment for MRSA infections. Performed at [facility], [place] [address], [place], [place] [address]          Radiology Studies: MR Cervical Spine W or Wo Contrast  Result Date: 05/03/2020 CLINICAL DATA:  Neck pain, acute, infection suspected. Abnormal thoracic spine MRI. EXAM: MRI CERVICAL SPINE WITHOUT AND WITH CONTRAST TECHNIQUE: Multiplanar and multiecho pulse sequences of the cervical spine, to include the craniocervical junction and cervicothoracic junction, were obtained without and with intravenous contrast. CONTRAST:  59mL GADAVIST GADOBUTROL 1 MMOL/ML IV SOLN COMPARISON:  MRI of the thoracic spine without and contrast 05/02/2020 FINDINGS: Alignment: 3 mm anterolisthesis present at C4-5. There is slight degenerative retrolisthesis C5-6. Straightening of the normal cervical lordosis is noted. Vertebrae: Increased T2 marrow signal is present at C5-6 with associated enhancement. Perivertebral edema and patchy enhancement is present from superior endplate of C6 through midbody of T1 on the left. Cord:  There is slight dural enhancement at C5-6 and C6-7 both anteriorly and posteriorly. No epidural fluid collection or epidural abscess is evident. Normal signal is present in the spinal cord. Moderate motion degrades the study. Posterior Fossa, vertebral arteries, paraspinal tissues: Craniocervical junction is within normal limits. Visualized intracranial contents are normal. Extensive soft tissue edema enhancement is noted anteriorly and on the left the C6-T1. Focal fluid collection to the left at the C7 level measures 20 x 6 mm. A collection more to the right at C7-T1 measures 21 mm cephalo caudad. Disc levels: C2-3: Asymmetric left-sided facet hypertrophy is present. Mild left foraminal narrowing is present. No significant stenosis present. C3-4: Asymmetric left-sided facet hypertrophy and uncovertebral spurring contributes to moderate left foraminal narrowing. There is partial effacement of ventral CSF. C4-5: A broad-based disc osteophyte complex is asymmetric to the left. Severe left and moderate right foraminal narrowing is present. Partial effacement of ventral CSF is noted. C5-6: No abnormal disc signal. Broad-based disc osteophyte complex partially effaces the ventral CSF. Moderate foraminal narrowing is present bilaterally. C6-7: No abnormal disc signal or enhancement. Moderate foraminal narrowing is worse on the left. C7-T1: Facet hypertrophy is present bilaterally. No significant stenosis is present. IMPRESSION: 1. Abnormal marrow signal and enhancement at C5-6 consistent with acute osteomyelitis. 2. Perivertebral edema and enhancement from superior endplate of C6 through midbody of T1 on the left. Findings are consistent with infection. 3. Extensive anterior and left-sided  paraspinous soft tissue edema and enhancement at C6-7 and C7-T1 with small fluid collections at C7-T1. Findings are consistent with infection. 4. Slight dural enhancement at C6-7 and C5-6 without evidence for epidural fluid collection  or abscess. 5. Multilevel spondylosis of the cervical spine as described. 6. Moderate left foraminal narrowing at C3-4. 7. Severe left and moderate right foraminal narrowing at C4-5. 8. Moderate foraminal narrowing bilaterally at C5-6. 9. Moderate foraminal narrowing bilaterally at C6-7 is worse on the left. 10. Facet hypertrophy at C7-T1 without significant stenosis. Electronically Signed   By: San Morelle M.D.   On: 05/03/2020 17:36   ECHOCARDIOGRAM COMPLETE  Result Date: 05/04/2020    ECHOCARDIOGRAM REPORT   Patient Name:   Martin Clark Date of Exam: 05/03/2020 Medical Rec #:  [id number]   Height:       70.0 in Accession #:    [id number]  Weight:       224.0 lb Date of Birth:  Jul 22, 1945   BSA:          2.190 m Patient Age:    89 years    BP:           114/64 mmHg Patient Gender: M           HR:           69 bpm. Exam Location:  Inpatient Procedure: 2D Echo, Cardiac Doppler and Color Doppler Indications:    Bacteremia R78.81  History:        Patient has no prior history of Echocardiogram examinations.                 Risk Factors:Hypertension and Dyslipidemia. Cancer. Multilple                 Sclerosis.  Sonographer:    Jonelle Sidle Dance Referring Phys: [place]  1. Left ventricular ejection fraction, by estimation, is 60 to 65%. The left ventricle has normal function. The left ventricle has no regional wall motion abnormalities. There is mild left ventricular hypertrophy. Left ventricular diastolic parameters are consistent with Grade I diastolic dysfunction (impaired relaxation).  2. Right ventricular systolic function is normal. The right ventricular size is normal.  3. Left atrial size was moderately dilated.  4. The mitral valve is normal in structure. Mild mitral valve regurgitation. No evidence of mitral stenosis.  5. The aortic valve is calcified. There is moderate calcification of the aortic valve. There is mild thickening of the aortic valve. Aortic valve regurgitation is not  visualized. No aortic stenosis is present.  6. There is borderline dilatation of the ascending aorta, measuring 39 mm.  7. The inferior vena cava is dilated in size with >50% respiratory variability, suggesting right atrial pressure of 8 mmHg. Conclusion(s)/Recommendation(s): No evidence of valvular vegetations on this transthoracic echocardiogram. Would recommend a transesophageal echocardiogram to exclude infective endocarditis if clinically indicated. FINDINGS  Left Ventricle: Left ventricular ejection fraction, by estimation, is 60 to 65%. The left ventricle has normal function. The left ventricle has no regional wall motion abnormalities. The left ventricular internal cavity size was normal in size. There is  mild left ventricular hypertrophy. Left ventricular diastolic parameters are consistent with Grade I diastolic dysfunction (impaired relaxation). Right Ventricle: The right ventricular size is normal. No increase in right ventricular wall thickness. Right ventricular systolic function is normal. Left Atrium: Left atrial size was moderately dilated. Right Atrium: Right atrial size was normal in size. Pericardium: There is no evidence of pericardial effusion. Mitral  Valve: The mitral valve is normal in structure. Mild mitral valve regurgitation. No evidence of mitral valve stenosis. Tricuspid Valve: The tricuspid valve is normal in structure. Tricuspid valve regurgitation is not demonstrated. No evidence of tricuspid stenosis. Aortic Valve: The aortic valve is calcified. There is moderate calcification of the aortic valve. There is mild thickening of the aortic valve. There is mild to moderate aortic valve annular calcification. Aortic valve regurgitation is not visualized. No  aortic stenosis is present. Pulmonic Valve: The pulmonic valve was normal in structure. Pulmonic valve regurgitation is trivial. No evidence of pulmonic stenosis. Aorta: The aortic root is normal in size and structure. There is  borderline dilatation of the ascending aorta, measuring 39 mm. Venous: The inferior vena cava is dilated in size with greater than 50% respiratory variability, suggesting right atrial pressure of 8 mmHg. IAS/Shunts: No atrial level shunt detected by color flow Doppler.  LEFT VENTRICLE PLAX 2D LVIDd:         5.00 cm  Diastology LVIDs:         3.40 cm  LV e' medial:    11.00 cm/s LV PW:         1.30 cm  LV E/e' medial:  11.1 LV IVS:        1.00 cm  LV e' lateral:   11.50 cm/s LVOT diam:     2.30 cm  LV E/e' lateral: 10.6 LV SV:         115 LV SV Index:   52 LVOT Area:     4.15 cm  RIGHT VENTRICLE             IVC RV Basal diam:  2.90 cm     IVC diam: 2.50 cm RV S prime:     12.50 cm/s TAPSE (M-mode): 2.4 cm LEFT ATRIUM             Index       RIGHT ATRIUM           Index LA diam:        5.00 cm 2.28 cm/m  RA Area:     19.40 cm LA Vol (A2C):   84.4 ml 38.53 ml/m RA Volume:   57.00 ml  26.02 ml/m LA Vol (A4C):   93.1 ml 42.51 ml/m LA Biplane Vol: 90.0 ml 41.09 ml/m  AORTIC VALVE LVOT Vmax:   138.00 cm/s LVOT Vmean:  86.600 cm/s LVOT VTI:    0.276 m  AORTA Ao Root diam: 3.90 cm Ao Asc diam:  3.90 cm MITRAL VALVE MV Area (PHT): 3.08 cm     SHUNTS MV Decel Time: 246 msec     Systemic VTI:  0.28 m MV E velocity: 122.00 cm/s  Systemic Diam: 2.30 cm MV A velocity: 74.70 cm/s MV E/A ratio:  1.63 Candee Furbish MD Electronically signed by Candee Furbish MD Signature Date/Time: 05/04/2020/7:08:30 AM    Final         Scheduled Meds: . levothyroxine  150 mcg Oral Q0600  . rosuvastatin  10 mg Oral Daily   Continuous Infusions: .  ceFAZolin (ANCEF) IV 2 g (05/04/20 0654)     LOS: 2 days    Time spent: 34mins    Kathie Dike, MD [facility]   If 7PM-7AM, please contact night-coverage [email or web address]  05/04/2020, 1:56 PM

## 2020-05-05 DIAGNOSIS — B9561 Methicillin susceptible Staphylococcus aureus infection as the cause of diseases classified elsewhere: Secondary | ICD-10-CM | POA: Diagnosis not present

## 2020-05-05 DIAGNOSIS — M4622 Osteomyelitis of vertebra, cervical region: Secondary | ICD-10-CM

## 2020-05-05 DIAGNOSIS — N179 Acute kidney failure, unspecified: Secondary | ICD-10-CM | POA: Diagnosis not present

## 2020-05-05 DIAGNOSIS — M4643 Discitis, unspecified, cervicothoracic region: Secondary | ICD-10-CM | POA: Diagnosis not present

## 2020-05-05 DIAGNOSIS — R7881 Bacteremia: Secondary | ICD-10-CM

## 2020-05-05 DIAGNOSIS — E876 Hypokalemia: Secondary | ICD-10-CM | POA: Diagnosis not present

## 2020-05-05 LAB — COMPREHENSIVE METABOLIC PANEL WITH GFR
ALT: 28 U/L (ref 0–44)
AST: 37 U/L (ref 15–41)
Albumin: 2.4 g/dL — ABNORMAL LOW (ref 3.5–5.0)
Alkaline Phosphatase: 45 U/L (ref 38–126)
Anion gap: 6 (ref 5–15)
BUN: 20 mg/dL (ref 8–23)
CO2: 29 mmol/L (ref 22–32)
Calcium: 8.3 mg/dL — ABNORMAL LOW (ref 8.9–10.3)
Chloride: 102 mmol/L (ref 98–111)
Creatinine, Ser: 1 mg/dL (ref 0.61–1.24)
GFR, Estimated: >60 mL/min (ref >60)
Glucose, Bld: 111 mg/dL — ABNORMAL HIGH (ref 70–99)
Potassium: 3.6 mmol/L (ref 3.5–5.1)
Sodium: 137 mmol/L (ref 135–145)
Total Bilirubin: 0.2 mg/dL — ABNORMAL LOW (ref 0.3–1.2)
Total Protein: 5.5 g/dL — ABNORMAL LOW (ref 6.5–8.1)

## 2020-05-05 LAB — CBC
HCT: 26.9 % — ABNORMAL LOW (ref 39.0–52.0)
Hemoglobin: 9 g/dL — ABNORMAL LOW (ref 13.0–17.0)
MCH: 30.9 pg (ref 26.0–34.0)
MCHC: 33.5 g/dL (ref 30.0–36.0)
MCV: 92.4 fL (ref 80.0–100.0)
Platelets: 208 10*3/uL (ref 150–400)
RBC: 2.91 MIL/uL — ABNORMAL LOW (ref 4.22–5.81)
RDW: 13.2 % (ref 11.5–15.5)
WBC: 7.5 10*3/uL (ref 4.0–10.5)
nRBC: 0 % (ref 0.0–0.2)

## 2020-05-05 NOTE — Plan of Care (Signed)

## 2020-05-05 NOTE — Progress Notes (Signed)
PROGRESS NOTE    Martin Clark  WRU:[id number] DOB: 1945/02/07 DOA: 05/02/2020 PCP: Holland Commons, FNP    Brief Narrative:  75 y/o male admitted to the hospital with fever and AMS. He was found to have evidence of discitis on imaging of spine and blood cultures positive for MSSA. Neurosurgery following as well as ID.    Assessment & Plan:   Active Problems:   Multiple sclerosis (HCC)   Discitis   Hematuria   Hyponatremia   Hypokalemia   AKI (acute kidney injury) ([place])   Hypotension   Hypothyroidism   Bacteremia due to Staphylococcus aureus   MSSA bacteremia with discitis  -MRI of T spine indicates possible infectious process at C7-T1 -MRI of C spine without any evidence of epidural abscess -he has 2 out of 2 sets positive for MSSA -repeat cultures sent 4/2 -Per neurosurgery, no operative management indicated -appreciate infectious disease assistance -2D echo without signs of vegetations -will need prolonged course of cefazolin -discussed with Dr. Linus Salmons and since patient will be on prolonged course of antibiotics, will not need to pursue TEE -if cultures are negative tomorrow, can consider PICC line placement and possible discharge home.  Hematuria -CT renal stone study did not show any nephrolithiasis -renal cysts noted on CT -reports that hematuria is now resolved -follow up with urology as outpatient if hematuria recurs -urine culture with no growth  Iron deficiency anemia/AOCD -no evidence of bleeding -FOBT negative -can consider starting iron supplementation once acute infectious process improved -continue to follow -transfuse for hemoglobin <7   History of multiple sclerosis -followed by Dr. Jannifer Franklin -takes glatiramer twice a week  AKI -last labs available for review are from 2011 -creatinine on admission 1.5 -he was taking NSAIDS, lisinopril and HCTZ prior to admission which currently on hold -no obstructive process on imaging -creatinine has  normalized with IV fluids  Hypothyroidism -TSH normal -continue on synthroid  HLD.  -continue on statin  Acute metabolic encephalopathy -likely related to fever and cyclobenzaprine -mental status back to baseline now  HTN -holding lisinopril and HCTZ due to renal function -continue to follow BP  Hypokalemia -replaced -Mg normal   DVT prophylaxis: SCDs Start: 05/02/20 1634  Code Status: full code Family Communication: discussed with patient Disposition Plan: Status is: Inpatient  Remains inpatient appropriate because:IV treatments appropriate due to intensity of illness or inability to take PO   Dispo: The patient is from: Home              Anticipated d/c is to: Home              Patient currently is not medically stable to d/c.   Difficult to place patient No   Consultants:   Neurosurgery  Infectious disease  Procedures:     Antimicrobials:    cefazolin 4/1>   Subjective: Mild soreness in back. Overall he is feeling well  Objective: Vitals:   05/04/20 2012 05/04/20 2318 05/05/20 0746 05/05/20 1111  BP: (!) 142/66 140/72 (!) 143/78 131/67  Pulse: 71 67 64 67  Resp: 16 18 20 18   Temp: 98.3 F (36.8 C) 98.4 F (36.9 C) 98.4 F (36.9 C) 98.5 F (36.9 C)  TempSrc: Oral Oral    SpO2: 97% 100% 98% 94%  Weight:      Height:        Intake/Output Summary (Last 24 hours) at 05/05/2020 1255 Last data filed at 05/05/2020 1100 Gross per 24 hour  Intake 1110.52 ml  Output 1800 ml  Net -689.48 ml   Filed Weights   05/02/20 0537  Weight: 101.6 kg    Examination:  General exam: Alert, awake, oriented x 3 Respiratory system: Clear to auscultation. Respiratory effort normal. Cardiovascular system:RRR. No murmurs, rubs, gallops. Gastrointestinal system: Abdomen is nondistended, soft and nontender. No organomegaly or masses felt. Normal bowel sounds heard. Central nervous system: Alert and oriented. No focal neurological deficits. Extremities: No  C/C/E, +pedal pulses Skin: No rashes, lesions or ulcers Psychiatry: Judgement and insight appear normal. Mood & affect appropriate.     Data Reviewed: I have personally reviewed following labs and imaging studies  CBC: Recent Labs  Lab 05/02/20 0635 05/02/20 0653 05/03/20 0110 05/04/20 0454 05/05/20 0332  WBC 9.4  --  7.2 9.0 7.5  NEUTROABS 8.3*  --   --   --   --   HGB 9.8* 9.2* 8.3* 9.3* 9.0*  HCT 29.0* 27.0* 24.2* 28.2* 26.9*  MCV 92.7  --  92.7 94.3 92.4  PLT 168  --  132* 211 956   Basic Metabolic Panel: Recent Labs  Lab 05/02/20 0635 05/02/20 0651 05/02/20 0653 05/02/20 1823 05/03/20 0110 05/04/20 0454 05/05/20 0332  NA 129*  --  130* 130* 132* 133* 137  K 2.7*  --  2.8* 3.3* 3.2* 3.5 3.6  CL 93*  --  92* 96* 97* 98 102  CO2 25  --   --  27 28 27 29   GLUCOSE 172*  --  172* 107* 117* 105* 111*  BUN 44*  --  38* 38* 35* 28* 20  CREATININE 1.53*  --  1.50* 1.50* 1.32* 1.14 1.00  CALCIUM 8.1*  --   --  7.9* 8.0* 8.5* 8.3*  MG  --  1.8  --   --  2.0  --   --    GFR: Estimated Creatinine Clearance: 79.7 mL/min (by C-G formula based on SCr of 1 mg/dL). Liver Function Tests: Recent Labs  Lab 05/02/20 0635 05/03/20 0110 05/04/20 0454 05/05/20 0332  AST 25 21 36 37  ALT 26 23 28 28   ALKPHOS 47 42 50 45  BILITOT 1.2 0.7 0.6 0.2*  PROT 6.2* 4.9* 5.9* 5.5*  ALBUMIN 3.0* 2.2* 2.6* 2.4*   No results for input(s): LIPASE, AMYLASE in the last 168 hours. No results for input(s): AMMONIA in the last 168 hours. Coagulation Profile: Recent Labs  Lab 05/02/20 0635  INR 1.2   Cardiac Enzymes: Recent Labs  Lab 05/03/20 0110  CKTOTAL 79   BNP (last 3 results) No results for input(s): PROBNP in the last 8760 hours. HbA1C: No results for input(s): HGBA1C in the last 72 hours. CBG: No results for input(s): GLUCAP in the last 168 hours. Lipid Profile: No results for input(s): CHOL, HDL, LDLCALC, TRIG, CHOLHDL, LDLDIRECT in the last 72 hours. Thyroid Function  Tests: Recent Labs    05/03/20 0110  TSH 1.721  FREET4 1.21*   Anemia Panel: Recent Labs    05/03/20 0110  VITAMINB12 1,690*  FOLATE 13.2  FERRITIN 277  TIBC 185*  IRON 8*  RETICCTPCT 0.9   Sepsis Labs: Recent Labs  Lab 05/02/20 0635 05/02/20 0831 05/03/20 0110 05/04/20 0454  PROCALCITON  --   --  11.55 5.62  LATICACIDVEN 1.2 1.1  --   --     Recent Results (from the past 240 hour(s))  Resp Panel by RT-PCR (Flu A&B, Covid) Nasopharyngeal Swab     Status: None   Collection Time: 05/02/20  6:35 AM   Specimen:  Nasopharyngeal Swab; Nasopharyngeal(NP) swabs in vial transport medium  Result Value Ref Range Status   SARS Coronavirus 2 by RT PCR NEGATIVE NEGATIVE Final    Comment: (NOTE) SARS-CoV-2 target nucleic acids are NOT DETECTED.  The SARS-CoV-2 RNA is generally detectable in upper respiratory specimens during the acute phase of infection. The lowest concentration of SARS-CoV-2 viral copies this assay can detect is 138 copies/mL. A negative result does not preclude SARS-Cov-2 infection and should not be used as the sole basis for treatment or other patient management decisions. A negative result may occur with  improper specimen collection/handling, submission of specimen other than nasopharyngeal swab, presence of viral mutation(s) within the areas targeted by this assay, and inadequate number of viral copies(<138 copies/mL). A negative result must be combined with clinical observations, patient history, and epidemiological information. The expected result is Negative.  Fact Sheet for Patients:  EntrepreneurPulse.com.au  Fact Sheet for Healthcare Providers:  IncredibleEmployment.be  This test is no t yet approved or cleared by the [place] FDA and  has been authorized for detection and/or diagnosis of SARS-CoV-2 by FDA under an Emergency Use Authorization (EUA). This EUA will remain  in effect (meaning this test can  be used) for the duration of the COVID-19 declaration under Section 564(b)(1) of the Act, 21 U.S.C.section 360bbb-3(b)(1), unless the authorization is terminated  or revoked sooner.       Influenza A by PCR NEGATIVE NEGATIVE Final   Influenza B by PCR NEGATIVE NEGATIVE Final    Comment: (NOTE) The Xpert Xpress SARS-CoV-2/FLU/RSV plus assay is intended as an aid in the diagnosis of influenza from Nasopharyngeal swab specimens and should not be used as a sole basis for treatment. Nasal washings and aspirates are unacceptable for Xpert Xpress SARS-CoV-2/FLU/RSV testing.  Fact Sheet for Patients: EntrepreneurPulse.com.au  Fact Sheet for Healthcare Providers: IncredibleEmployment.be  This test is not yet approved or cleared by the [place] FDA and has been authorized for detection and/or diagnosis of SARS-CoV-2 by FDA under an Emergency Use Authorization (EUA). This EUA will remain in effect (meaning this test can be used) for the duration of the COVID-19 declaration under Section 564(b)(1) of the Act, 21 U.S.C. section 360bbb-3(b)(1), unless the authorization is terminated or revoked.  Performed at [facility], [place] [address], [place], [place] [address]   Blood Culture (routine x 2)     Status: Abnormal   Collection Time: 05/02/20  6:35 AM   Specimen: BLOOD  Result Value Ref Range Status   Specimen Description   Final    BLOOD RIGHT ANTECUBITAL Performed at [facility] [address], [place], [place] [address]    Special Requests   Final    BOTTLES DRAWN AEROBIC AND ANAEROBIC Blood Culture results may not be optimal due to an excessive volume of blood received in culture bottles Performed at [facility] [address] [place], [place] [address]    Culture  Setup Time   Final    GRAM POSITIVE COCCI IN CLUSTERS IN BOTH AEROBIC AND ANAEROBIC BOTTLES CRITICAL RESULT CALLED  TO, READ BACK BY AND VERIFIED WITH: Rockey Situ [facility] 05/02/20 2139 JDW Performed at [facility], [place] [address] [place],  [address]    Culture STAPHYLOCOCCUS AUREUS (A)  Final   Report Status 05/04/2020 FINAL  Final   Organism ID, Bacteria STAPHYLOCOCCUS AUREUS  Final      Susceptibility   Staphylococcus aureus - MIC*    CIPROFLOXACIN <=0.5 SENSITIVE  Sensitive     ERYTHROMYCIN <=0.25 SENSITIVE Sensitive     GENTAMICIN <=0.5 SENSITIVE Sensitive     OXACILLIN 0.5 SENSITIVE Sensitive     TETRACYCLINE >=16 RESISTANT Resistant     VANCOMYCIN <=0.5 SENSITIVE Sensitive     TRIMETH/SULFA <=10 SENSITIVE Sensitive     CLINDAMYCIN <=0.25 SENSITIVE Sensitive     RIFAMPIN <=0.5 SENSITIVE Sensitive     Inducible Clindamycin NEGATIVE Sensitive     * STAPHYLOCOCCUS AUREUS  Blood Culture (routine x 2)     Status: Abnormal   Collection Time: 05/02/20  6:35 AM   Specimen: BLOOD  Result Value Ref Range Status   Specimen Description   Final    BLOOD BLOOD LEFT HAND Performed at [facility] [address], [place], [place] [address]    Special Requests   Final    BOTTLES DRAWN AEROBIC AND ANAEROBIC Blood Culture adequate volume Performed at [facility] [address], [place], [place] [address]    Culture  Setup Time   Final    GRAM POSITIVE COCCI IN CLUSTERS IN BOTH AEROBIC AND ANAEROBIC BOTTLES CRITICAL VALUE NOTED.  VALUE IS CONSISTENT WITH PREVIOUSLY REPORTED AND CALLED VALUE.    Culture (A)  Final    STAPHYLOCOCCUS AUREUS SUSCEPTIBILITIES PERFORMED ON PREVIOUS CULTURE WITHIN THE LAST 5 DAYS. Performed at [facility], [place] [address] [place], [place] [address]    Report Status 05/04/2020 FINAL  Final  Blood Culture ID Panel (Reflexed)     Status: Abnormal   Collection Time: 05/02/20  6:35 AM  Result Value Ref Range Status   Enterococcus faecalis NOT DETECTED NOT DETECTED Final   Enterococcus Faecium NOT DETECTED NOT DETECTED  Final   Listeria monocytogenes NOT DETECTED NOT DETECTED Final   Staphylococcus species DETECTED (A) NOT DETECTED Final    Comment: CRITICAL RESULT CALLED TO, READ BACK BY AND VERIFIED WITH: Rockey Situ [facility] 05/02/20 2139 JDW    Staphylococcus aureus (BCID) DETECTED (A) NOT DETECTED Final    Comment: CRITICAL RESULT CALLED TO, READ BACK BY AND VERIFIED WITH: Rockey Situ [facility] 05/02/20 2139 JDW    Staphylococcus epidermidis NOT DETECTED NOT DETECTED Final   Staphylococcus lugdunensis NOT DETECTED NOT DETECTED Final   Streptococcus species NOT DETECTED NOT DETECTED Final   Streptococcus agalactiae NOT DETECTED NOT DETECTED Final   Streptococcus pneumoniae NOT DETECTED NOT DETECTED Final   Streptococcus pyogenes NOT DETECTED NOT DETECTED Final   A.calcoaceticus-baumannii NOT DETECTED NOT DETECTED Final   Bacteroides fragilis NOT DETECTED NOT DETECTED Final   Enterobacterales NOT DETECTED NOT DETECTED Final   Enterobacter cloacae complex NOT DETECTED NOT DETECTED Final   Escherichia coli NOT DETECTED NOT DETECTED Final   Klebsiella aerogenes NOT DETECTED NOT DETECTED Final   Klebsiella oxytoca NOT DETECTED NOT DETECTED Final   Klebsiella pneumoniae NOT DETECTED NOT DETECTED Final   Proteus species NOT DETECTED NOT DETECTED Final   Salmonella species NOT DETECTED NOT DETECTED Final   Serratia marcescens NOT DETECTED NOT DETECTED Final   Haemophilus influenzae NOT DETECTED NOT DETECTED Final   Neisseria meningitidis NOT DETECTED NOT DETECTED Final   Pseudomonas aeruginosa NOT DETECTED NOT DETECTED Final   Stenotrophomonas maltophilia NOT DETECTED NOT DETECTED Final   Candida albicans NOT DETECTED NOT DETECTED Final   Candida auris NOT DETECTED NOT DETECTED Final   Candida glabrata NOT DETECTED NOT DETECTED Final   Candida krusei NOT DETECTED NOT DETECTED Final   Candida parapsilosis NOT DETECTED NOT DETECTED Final  Candida tropicalis NOT DETECTED NOT DETECTED Final   Cryptococcus  neoformans/gattii NOT DETECTED NOT DETECTED Final   Meth resistant mecA/C and MREJ NOT DETECTED NOT DETECTED Final    Comment: Performed at [facility], [place] [address] [place], [place] [address]  Urine culture     Status: None   Collection Time: 05/02/20  7:52 AM   Specimen: In/Out Cath Urine  Result Value Ref Range Status   Specimen Description   Final    IN/OUT CATH URINE Performed at [facility] [address], [place], [place] [address]    Special Requests   Final    NONE Performed at [facility], [place] [address], [place], [place] [address]    Culture   Final    NO GROWTH Performed at [facility], [place] [address] [place], [place] [address]    Report Status 05/03/2020 FINAL  Final  MRSA PCR Screening     Status: None   Collection Time: 05/02/20  6:32 PM   Specimen: Nasopharyngeal  Result Value Ref Range Status   MRSA by PCR NEGATIVE NEGATIVE Final    Comment:        The GeneXpert MRSA Assay (FDA approved for NASAL specimens only), is one component of a comprehensive MRSA colonization surveillance program. It is not intended to diagnose MRSA infection nor to guide or monitor treatment for MRSA infections. Performed at [facility], [place] [address] [place], [place] [address]          Radiology Studies: MR Cervical Spine W or Wo Contrast  Result Date: 05/03/2020 CLINICAL DATA:  Neck pain, acute, infection suspected. Abnormal thoracic spine MRI. EXAM: MRI CERVICAL SPINE WITHOUT AND WITH CONTRAST TECHNIQUE: Multiplanar and multiecho pulse sequences of the cervical spine, to include the craniocervical junction and cervicothoracic junction, were obtained without and with intravenous contrast. CONTRAST:  89mL GADAVIST GADOBUTROL 1 MMOL/ML IV SOLN COMPARISON:  MRI of the thoracic spine without and contrast 05/02/2020 FINDINGS: Alignment: 3 mm anterolisthesis present at C4-5. There is slight degenerative retrolisthesis  C5-6. Straightening of the normal cervical lordosis is noted. Vertebrae: Increased T2 marrow signal is present at C5-6 with associated enhancement. Perivertebral edema and patchy enhancement is present from superior endplate of C6 through midbody of T1 on the left. Cord: There is slight dural enhancement at C5-6 and C6-7 both anteriorly and posteriorly. No epidural fluid collection or epidural abscess is evident. Normal signal is present in the spinal cord. Moderate motion degrades the study. Posterior Fossa, vertebral arteries, paraspinal tissues: Craniocervical junction is within normal limits. Visualized intracranial contents are normal. Extensive soft tissue edema enhancement is noted anteriorly and on the left the C6-T1. Focal fluid collection to the left at the C7 level measures 20 x 6 mm. A collection more to the right at C7-T1 measures 21 mm cephalo caudad. Disc levels: C2-3: Asymmetric left-sided facet hypertrophy is present. Mild left foraminal narrowing is present. No significant stenosis present. C3-4: Asymmetric left-sided facet hypertrophy and uncovertebral spurring contributes to moderate left foraminal narrowing. There is partial effacement of ventral CSF. C4-5: A broad-based disc osteophyte complex is asymmetric to the left. Severe left and moderate right foraminal narrowing is present. Partial effacement of ventral CSF is noted. C5-6: No abnormal disc signal. Broad-based disc osteophyte complex partially effaces the ventral CSF. Moderate foraminal narrowing is present bilaterally. C6-7: No abnormal disc signal or enhancement. Moderate foraminal narrowing is worse on the left. C7-T1: Facet hypertrophy is present  bilaterally. No significant stenosis is present. IMPRESSION: 1. Abnormal marrow signal and enhancement at C5-6 consistent with acute osteomyelitis. 2. Perivertebral edema and enhancement from superior endplate of C6 through midbody of T1 on the left. Findings are consistent with infection.  3. Extensive anterior and left-sided paraspinous soft tissue edema and enhancement at C6-7 and C7-T1 with small fluid collections at C7-T1. Findings are consistent with infection. 4. Slight dural enhancement at C6-7 and C5-6 without evidence for epidural fluid collection or abscess. 5. Multilevel spondylosis of the cervical spine as described. 6. Moderate left foraminal narrowing at C3-4. 7. Severe left and moderate right foraminal narrowing at C4-5. 8. Moderate foraminal narrowing bilaterally at C5-6. 9. Moderate foraminal narrowing bilaterally at C6-7 is worse on the left. 10. Facet hypertrophy at C7-T1 without significant stenosis. Electronically Signed   By: San Morelle M.D.   On: 05/03/2020 17:36   ECHOCARDIOGRAM COMPLETE  Result Date: 05/04/2020    ECHOCARDIOGRAM REPORT   Patient Name:   Martin Clark Date of Exam: 05/03/2020 Medical Rec #:  [id number]   Height:       70.0 in Accession #:    [id number]  Weight:       224.0 lb Date of Birth:  1945/07/18   BSA:          2.190 m Patient Age:    36 years    BP:           114/64 mmHg Patient Gender: M           HR:           69 bpm. Exam Location:  Inpatient Procedure: 2D Echo, Cardiac Doppler and Color Doppler Indications:    Bacteremia R78.81  History:        Patient has no prior history of Echocardiogram examinations.                 Risk Factors:Hypertension and Dyslipidemia. Cancer. Multilple                 Sclerosis.  Sonographer:    Jonelle Sidle Dance Referring Phys: [place]  1. Left ventricular ejection fraction, by estimation, is 60 to 65%. The left ventricle has normal function. The left ventricle has no regional wall motion abnormalities. There is mild left ventricular hypertrophy. Left ventricular diastolic parameters are consistent with Grade I diastolic dysfunction (impaired relaxation).  2. Right ventricular systolic function is normal. The right ventricular size is normal.  3. Left atrial size was moderately dilated.  4.  The mitral valve is normal in structure. Mild mitral valve regurgitation. No evidence of mitral stenosis.  5. The aortic valve is calcified. There is moderate calcification of the aortic valve. There is mild thickening of the aortic valve. Aortic valve regurgitation is not visualized. No aortic stenosis is present.  6. There is borderline dilatation of the ascending aorta, measuring 39 mm.  7. The inferior vena cava is dilated in size with >50% respiratory variability, suggesting right atrial pressure of 8 mmHg. Conclusion(s)/Recommendation(s): No evidence of valvular vegetations on this transthoracic echocardiogram. Would recommend a transesophageal echocardiogram to exclude infective endocarditis if clinically indicated. FINDINGS  Left Ventricle: Left ventricular ejection fraction, by estimation, is 60 to 65%. The left ventricle has normal function. The left ventricle has no regional wall motion abnormalities. The left ventricular internal cavity size was normal in size. There is  mild left ventricular hypertrophy. Left ventricular diastolic parameters are consistent with Grade I diastolic dysfunction (impaired relaxation). Right  Ventricle: The right ventricular size is normal. No increase in right ventricular wall thickness. Right ventricular systolic function is normal. Left Atrium: Left atrial size was moderately dilated. Right Atrium: Right atrial size was normal in size. Pericardium: There is no evidence of pericardial effusion. Mitral Valve: The mitral valve is normal in structure. Mild mitral valve regurgitation. No evidence of mitral valve stenosis. Tricuspid Valve: The tricuspid valve is normal in structure. Tricuspid valve regurgitation is not demonstrated. No evidence of tricuspid stenosis. Aortic Valve: The aortic valve is calcified. There is moderate calcification of the aortic valve. There is mild thickening of the aortic valve. There is mild to moderate aortic valve annular calcification. Aortic  valve regurgitation is not visualized. No  aortic stenosis is present. Pulmonic Valve: The pulmonic valve was normal in structure. Pulmonic valve regurgitation is trivial. No evidence of pulmonic stenosis. Aorta: The aortic root is normal in size and structure. There is borderline dilatation of the ascending aorta, measuring 39 mm. Venous: The inferior vena cava is dilated in size with greater than 50% respiratory variability, suggesting right atrial pressure of 8 mmHg. IAS/Shunts: No atrial level shunt detected by color flow Doppler.  LEFT VENTRICLE PLAX 2D LVIDd:         5.00 cm  Diastology LVIDs:         3.40 cm  LV e' medial:    11.00 cm/s LV PW:         1.30 cm  LV E/e' medial:  11.1 LV IVS:        1.00 cm  LV e' lateral:   11.50 cm/s LVOT diam:     2.30 cm  LV E/e' lateral: 10.6 LV SV:         115 LV SV Index:   52 LVOT Area:     4.15 cm  RIGHT VENTRICLE             IVC RV Basal diam:  2.90 cm     IVC diam: 2.50 cm RV S prime:     12.50 cm/s TAPSE (M-mode): 2.4 cm LEFT ATRIUM             Index       RIGHT ATRIUM           Index LA diam:        5.00 cm 2.28 cm/m  RA Area:     19.40 cm LA Vol (A2C):   84.4 ml 38.53 ml/m RA Volume:   57.00 ml  26.02 ml/m LA Vol (A4C):   93.1 ml 42.51 ml/m LA Biplane Vol: 90.0 ml 41.09 ml/m  AORTIC VALVE LVOT Vmax:   138.00 cm/s LVOT Vmean:  86.600 cm/s LVOT VTI:    0.276 m  AORTA Ao Root diam: 3.90 cm Ao Asc diam:  3.90 cm MITRAL VALVE MV Area (PHT): 3.08 cm     SHUNTS MV Decel Time: 246 msec     Systemic VTI:  0.28 m MV E velocity: 122.00 cm/s  Systemic Diam: 2.30 cm MV A velocity: 74.70 cm/s MV E/A ratio:  1.63 Candee Furbish MD Electronically signed by Candee Furbish MD Signature Date/Time: 05/04/2020/7:08:30 AM    Final         Scheduled Meds: . levothyroxine  150 mcg Oral Q0600  . rosuvastatin  10 mg Oral Daily   Continuous Infusions: .  ceFAZolin (ANCEF) IV 2 g (05/05/20 0655)     LOS: 3 days    Time spent: 62mins    [employer],  MD [facility]   If 7PM-7AM, please contact night-coverage [email or web address]  05/05/2020, 12:55 PM

## 2020-05-05 NOTE — Progress Notes (Signed)
[facility]   Reason for visit: Follow up on discitis  Interval History: he reports feeling a lot better, moving better, able to move his arms up above his head.  WBC wnl, no fever.   Day 5 total antibiotics Day 4 cefazolin Blood cultures 4/2 NGTD Blood cultures 3/31 4/4 bottles MSSA  Physical Exam: Constitutional:  Vitals:   05/05/20 0746 05/05/20 1111  BP: (!) 143/78 131/67  Pulse: 64 67  Resp: 20 18  Temp: 98.4 F (36.9 C) 98.5 F (36.9 C)  SpO2: 98% 94%   patient appears in NAD Respiratory: Normal respiratory effort; CTA B Cardiovascular: RRR Neuro: able to rise from his chair without difficulty, ambulating without help  Review of Systems: Constitutional: negative for fevers and chills Gastrointestinal: negative for nausea and diarrhea Integument/breast: negative for rash  Lab Results  Component Value Date   WBC 7.5 05/05/2020   HGB 9.0 (L) 05/05/2020   HCT 26.9 (L) 05/05/2020   MCV 92.4 05/05/2020   PLT 208 05/05/2020    Lab Results  Component Value Date   CREATININE 1.00 05/05/2020   BUN 20 05/05/2020   NA 137 05/05/2020   K 3.6 05/05/2020   CL 102 05/05/2020   CO2 29 05/05/2020    Lab Results  Component Value Date   ALT 28 05/05/2020   AST 37 05/05/2020   ALKPHOS 45 05/05/2020     Microbiology: Recent Results (from the past 240 hour(s))  Resp Panel by RT-PCR (Flu A&B, Covid) Nasopharyngeal Swab     Status: None   Collection Time: 05/02/20  6:35 AM   Specimen: Nasopharyngeal Swab; Nasopharyngeal(NP) swabs in vial transport medium  Result Value Ref Range Status   SARS Coronavirus 2 by RT PCR NEGATIVE NEGATIVE Final    Comment: (NOTE) SARS-CoV-2 target nucleic acids are NOT DETECTED.  The SARS-CoV-2 RNA is generally detectable in upper respiratory specimens during the acute phase of infection. The lowest concentration of SARS-CoV-2 viral copies this assay can detect is 138 copies/mL. A negative result does not  preclude SARS-Cov-2 infection and should not be used as the sole basis for treatment or other patient management decisions. A negative result may occur with  improper specimen collection/handling, submission of specimen other than nasopharyngeal swab, presence of viral mutation(s) within the areas targeted by this assay, and inadequate number of viral copies(<138 copies/mL). A negative result must be combined with clinical observations, patient history, and epidemiological information. The expected result is Negative.  Fact Sheet for Patients:  EntrepreneurPulse.com.au  Fact Sheet for Healthcare Providers:  IncredibleEmployment.be  This test is no t yet approved or cleared by the [place] FDA and  has been authorized for detection and/or diagnosis of SARS-CoV-2 by FDA under an Emergency Use Authorization (EUA). This EUA will remain  in effect (meaning this test can be used) for the duration of the COVID-19 declaration under Section 564(b)(1) of the Act, 21 U.S.C.section 360bbb-3(b)(1), unless the authorization is terminated  or revoked sooner.       Influenza A by PCR NEGATIVE NEGATIVE Final   Influenza B by PCR NEGATIVE NEGATIVE Final    Comment: (NOTE) The Xpert Xpress SARS-CoV-2/FLU/RSV plus assay is intended as an aid in the diagnosis of influenza from Nasopharyngeal swab specimens and should not be used as a sole basis for treatment. Nasal washings and aspirates are unacceptable for Xpert Xpress SARS-CoV-2/FLU/RSV testing.  Fact Sheet for Patients: EntrepreneurPulse.com.au  Fact Sheet for Healthcare Providers: IncredibleEmployment.be  This test  is not yet approved or cleared by the [place] and has been authorized for detection and/or diagnosis of SARS-CoV-2 by FDA under an Emergency Use Authorization (EUA). This EUA will remain in effect (meaning this test can be used) for the  duration of the COVID-19 declaration under Section 564(b)(1) of the Act, 21 U.S.C. section 360bbb-3(b)(1), unless the authorization is terminated or revoked.  Performed at [facility], [place] [address] [place], [place] [address]   Blood Culture (routine x 2)     Status: Abnormal   Collection Time: 05/02/20  6:35 AM   Specimen: BLOOD  Result Value Ref Range Status   Specimen Description   Final    BLOOD RIGHT ANTECUBITAL Performed at [place] [address] [place], [place] [address]    Special Requests   Final    BOTTLES DRAWN AEROBIC AND ANAEROBIC Blood Culture results may not be optimal due to an excessive volume of blood received in culture bottles Performed at [place] [address] [place], [place] [address]    Culture  Setup Time   Final    GRAM POSITIVE COCCI IN CLUSTERS IN BOTH AEROBIC AND ANAEROBIC BOTTLES CRITICAL RESULT CALLED TO, READ BACK BY AND VERIFIED WITH: Rockey Situ [facility] 05/02/20 2139 JDW Performed at [facility], [place] [address], [place], [place] [address]    Culture STAPHYLOCOCCUS AUREUS (A)  Final   Report Status 05/04/2020 FINAL  Final   Organism ID, Bacteria STAPHYLOCOCCUS AUREUS  Final      Susceptibility   Staphylococcus aureus - MIC*    CIPROFLOXACIN <=0.5 SENSITIVE Sensitive     ERYTHROMYCIN <=0.25 SENSITIVE Sensitive     GENTAMICIN <=0.5 SENSITIVE Sensitive     OXACILLIN 0.5 SENSITIVE Sensitive     TETRACYCLINE >=16 RESISTANT Resistant     VANCOMYCIN <=0.5 SENSITIVE Sensitive     TRIMETH/SULFA <=10 SENSITIVE Sensitive     CLINDAMYCIN <=0.25 SENSITIVE Sensitive     RIFAMPIN <=0.5 SENSITIVE Sensitive     Inducible Clindamycin NEGATIVE Sensitive     * STAPHYLOCOCCUS AUREUS  Blood Culture (routine x 2)     Status: Abnormal   Collection Time: 05/02/20  6:35 AM   Specimen: BLOOD  Result Value Ref Range Status   Specimen Description   Final    BLOOD BLOOD LEFT HAND Performed  at [place] [address], [place], [place] [address]    Special Requests   Final    BOTTLES DRAWN AEROBIC AND ANAEROBIC Blood Culture adequate volume Performed at [place] [address] [place], [place] [address]    Culture  Setup Time   Final    GRAM POSITIVE COCCI IN CLUSTERS IN BOTH AEROBIC AND ANAEROBIC BOTTLES CRITICAL VALUE NOTED.  VALUE IS CONSISTENT WITH PREVIOUSLY REPORTED AND CALLED VALUE.    Culture (A)  Final    STAPHYLOCOCCUS AUREUS SUSCEPTIBILITIES PERFORMED ON PREVIOUS CULTURE WITHIN THE LAST 5 DAYS. Performed at [facility], [place] [address] [place], [place] [address]    Report Status 05/04/2020 FINAL  Final  Blood Culture ID Panel (Reflexed)     Status: Abnormal   Collection Time: 05/02/20  6:35 AM  Result Value Ref Range Status   Enterococcus faecalis NOT DETECTED NOT DETECTED Final   Enterococcus Faecium NOT DETECTED NOT DETECTED Final   Listeria monocytogenes NOT DETECTED NOT DETECTED Final   Staphylococcus species DETECTED (A) NOT DETECTED Final    Comment: CRITICAL  RESULT CALLED TO, READ BACK BY AND VERIFIED WITH: Rockey Situ [facility] 05/02/20 2139 JDW    Staphylococcus aureus (BCID) DETECTED (A) NOT DETECTED Final    Comment: CRITICAL RESULT CALLED TO, READ BACK BY AND VERIFIED WITH: Rockey Situ [facility] 05/02/20 2139 JDW    Staphylococcus epidermidis NOT DETECTED NOT DETECTED Final   Staphylococcus lugdunensis NOT DETECTED NOT DETECTED Final   Streptococcus species NOT DETECTED NOT DETECTED Final   Streptococcus agalactiae NOT DETECTED NOT DETECTED Final   Streptococcus pneumoniae NOT DETECTED NOT DETECTED Final   Streptococcus pyogenes NOT DETECTED NOT DETECTED Final   A.calcoaceticus-baumannii NOT DETECTED NOT DETECTED Final   Bacteroides fragilis NOT DETECTED NOT DETECTED Final   Enterobacterales NOT DETECTED NOT DETECTED Final   Enterobacter cloacae complex NOT DETECTED NOT DETECTED Final   Escherichia coli  NOT DETECTED NOT DETECTED Final   Klebsiella aerogenes NOT DETECTED NOT DETECTED Final   Klebsiella oxytoca NOT DETECTED NOT DETECTED Final   Klebsiella pneumoniae NOT DETECTED NOT DETECTED Final   Proteus species NOT DETECTED NOT DETECTED Final   Salmonella species NOT DETECTED NOT DETECTED Final   Serratia marcescens NOT DETECTED NOT DETECTED Final   Haemophilus influenzae NOT DETECTED NOT DETECTED Final   Neisseria meningitidis NOT DETECTED NOT DETECTED Final   Pseudomonas aeruginosa NOT DETECTED NOT DETECTED Final   Stenotrophomonas maltophilia NOT DETECTED NOT DETECTED Final   Candida albicans NOT DETECTED NOT DETECTED Final   Candida auris NOT DETECTED NOT DETECTED Final   Candida glabrata NOT DETECTED NOT DETECTED Final   Candida krusei NOT DETECTED NOT DETECTED Final   Candida parapsilosis NOT DETECTED NOT DETECTED Final   Candida tropicalis NOT DETECTED NOT DETECTED Final   Cryptococcus neoformans/gattii NOT DETECTED NOT DETECTED Final   Meth resistant mecA/C and MREJ NOT DETECTED NOT DETECTED Final    Comment: Performed at [facility], [address], [place], [place] [address]  Urine culture     Status: None   Collection Time: 05/02/20  7:52 AM   Specimen: In/Out Cath Urine  Result Value Ref Range Status   Specimen Description   Final    IN/OUT CATH URINE Performed at [place] [address] [place], [place] [address]    Special Requests   Final    NONE Performed at [facility], [place] [address], [place], [place] [address]    Culture   Final    NO GROWTH Performed at [facility], [place] [address] [place], [place] [address]    Report Status 05/03/2020 FINAL  Final  MRSA PCR Screening     Status: None   Collection Time: 05/02/20  6:32 PM   Specimen: Nasopharyngeal  Result Value Ref Range Status   MRSA by PCR NEGATIVE NEGATIVE Final    Comment:        The GeneXpert MRSA Assay (FDA approved for NASAL  specimens only), is one component of a comprehensive MRSA colonization surveillance program. It is not intended to diagnose MRSA infection nor to guide or monitor treatment for MRSA infections. Performed at [facility], [place] [address] [place], [place] [address]   Culture, blood (routine x 2)     Status: None (Preliminary result)   Collection Time: 05/04/20  4:54 AM   Specimen: BLOOD RIGHT ARM  Result Value Ref Range Status   Specimen Description BLOOD RIGHT ARM  Final   Special Requests   Final    AEROBIC BOTTLE ONLY Blood Culture results  may not be optimal due to an inadequate volume of blood received in culture bottles   Culture   Final    NO GROWTH 1 DAY Performed at [facility], [place] [address] [place], [place] [address]    Report Status PENDING  Incomplete  Culture, blood (routine x 2)     Status: None (Preliminary result)   Collection Time: 05/04/20  5:08 AM   Specimen: BLOOD LEFT ARM  Result Value Ref Range Status   Specimen Description BLOOD LEFT ARM  Final   Special Requests   Final    AEROBIC BOTTLE ONLY Blood Culture results may not be optimal due to an inadequate volume of blood received in culture bottles   Culture   Final    NO GROWTH 1 DAY Performed at [facility], [place] [address] [place], [place] [address]    Report Status PENDING  Incomplete    Impression/Plan:  1. 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  He is improved today and doing well.  He will need a prolonged duration of cefazolin for minimum 6 weeks through May 13.    2.  Access - if blood cultures remain negative tomorrow, ok for a PICC line tomorrow from ID standpoint.    3.  Disposition - will do OPAT tomorrow if blood cultures remain negative.    4.  MSSA bacteremia - repeat blood cultures without growth.  TTE without concerns for vegetation.  No  indication from an ID standpoint for a TEE since it will not change the duration of treatment.

## 2020-05-06 ENCOUNTER — Inpatient Hospital Stay: Payer: Self-pay

## 2020-05-06 DIAGNOSIS — M4642 Discitis, unspecified, cervical region: Secondary | ICD-10-CM | POA: Diagnosis not present

## 2020-05-06 DIAGNOSIS — R7881 Bacteremia: Secondary | ICD-10-CM | POA: Diagnosis not present

## 2020-05-06 DIAGNOSIS — R319 Hematuria, unspecified: Secondary | ICD-10-CM | POA: Diagnosis not present

## 2020-05-06 DIAGNOSIS — M4622 Osteomyelitis of vertebra, cervical region: Secondary | ICD-10-CM | POA: Diagnosis not present

## 2020-05-06 DIAGNOSIS — M4643 Discitis, unspecified, cervicothoracic region: Secondary | ICD-10-CM | POA: Diagnosis not present

## 2020-05-06 DIAGNOSIS — B9561 Methicillin susceptible Staphylococcus aureus infection as the cause of diseases classified elsewhere: Secondary | ICD-10-CM | POA: Diagnosis not present

## 2020-05-06 DIAGNOSIS — N179 Acute kidney failure, unspecified: Secondary | ICD-10-CM | POA: Diagnosis not present

## 2020-05-06 LAB — COMPREHENSIVE METABOLIC PANEL
ALT: 34 U/L (ref 0–44)
AST: 41 U/L (ref 15–41)
Albumin: 2.6 g/dL — ABNORMAL LOW (ref 3.5–5.0)
Alkaline Phosphatase: 53 U/L (ref 38–126)
Anion gap: 8 (ref 5–15)
BUN: 20 mg/dL (ref 8–23)
CO2: 28 mmol/L (ref 22–32)
Calcium: 8.8 mg/dL — ABNORMAL LOW (ref 8.9–10.3)
Chloride: 101 mmol/L (ref 98–111)
Creatinine, Ser: 1.07 mg/dL (ref 0.61–1.24)
GFR, Estimated: >60 mL/min (ref >60)
Glucose, Bld: 117 mg/dL — ABNORMAL HIGH (ref 70–99)
Potassium: 4 mmol/L (ref 3.5–5.1)
Sodium: 137 mmol/L (ref 135–145)
Total Bilirubin: 0.3 mg/dL (ref 0.3–1.2)
Total Protein: 6.1 g/dL — ABNORMAL LOW (ref 6.5–8.1)

## 2020-05-06 LAB — CBC
HCT: 29.8 % — ABNORMAL LOW (ref 39.0–52.0)
Hemoglobin: 9.9 g/dL — ABNORMAL LOW (ref 13.0–17.0)
MCH: 30.9 pg (ref 26.0–34.0)
MCHC: 33.2 g/dL (ref 30.0–36.0)
MCV: 93.1 fL (ref 80.0–100.0)
Platelets: 265 10*3/uL (ref 150–400)
RBC: 3.2 MIL/uL — ABNORMAL LOW (ref 4.22–5.81)
RDW: 13.2 % (ref 11.5–15.5)
WBC: 7.6 10*3/uL (ref 4.0–10.5)
nRBC: 0 % (ref 0.0–0.2)

## 2020-05-06 MED ORDER — SODIUM CHLORIDE 0.9% FLUSH: 10.0000 mL | INTRAVENOUS | Status: DC | PRN

## 2020-05-06 MED ORDER — CEFAZOLIN IV (FOR PTA / DISCHARGE USE ONLY): 2.0000 g | Freq: Three times a day (TID) | INTRAVENOUS | 0 refills | Status: AC

## 2020-05-06 MED ORDER — TRAMADOL HCL 50 MG PO TABS: 50.0000 mg | ORAL_TABLET | Freq: Four times a day (QID) | ORAL | 0 refills | Status: DC | PRN

## 2020-05-06 MED ORDER — SODIUM CHLORIDE 0.9% FLUSH: 10.0000 mL | Freq: Two times a day (BID) | INTRAVENOUS | Status: DC

## 2020-05-06 MED ORDER — CHLORHEXIDINE GLUCONATE CLOTH 2 % EX PADS: 6.0000 | MEDICATED_PAD | Freq: Every day | CUTANEOUS | Status: DC

## 2020-05-06 MED ORDER — HEPARIN SOD (PORK) LOCK FLUSH 100 UNIT/ML IV SOLN
250.0000 [IU] | INTRAVENOUS | Status: AC | PRN
  Administered 2020-05-06: 250 [IU]
  Filled 2020-05-06: qty 2.5

## 2020-05-06 NOTE — Progress Notes (Signed)
PHARMACY CONSULT NOTE FOR:   OUTPATIENT  PARENTERAL ANTIBIOTIC THERAPY (OPAT)  Indication: MSSA bacteremia + discitis Regimen: Cefazolin 2g IV every 8 hours End date: 06/14/20  IV antibiotic discharge orders are pended. To discharging provider:  please sign these orders via discharge navigator,  Select New Orders & click on the button choice - Manage This Unsigned Work.     Thank you for allowing pharmacy to be a part of this patient's care.  Mercy Riding, PharmD PGY1 Acute Care Pharmacy Resident

## 2020-05-06 NOTE — Progress Notes (Signed)
Peripherally Inserted Central Catheter Placement  The IV Nurse has discussed with the patient and/or persons authorized to consent for the patient, the purpose of this procedure and the potential benefits and risks involved with this procedure.  The benefits include less needle sticks, lab draws from the catheter, and the patient may be discharged home with the catheter. Risks include, but not limited to, infection, bleeding, blood clot (thrombus formation), and puncture of an artery; nerve damage and irregular heartbeat and possibility to perform a PICC exchange if needed/ordered by physician.  Alternatives to this procedure were also discussed.  Bard Power PICC patient education guide, fact sheet on infection prevention and patient information card has been provided to patient /or left at bedside.    PICC Placement Documentation  PICC Single Lumen 16/96/78 PICC Right Basilic 40 cm 0 cm (Active)  Indication for Insertion or Continuance of Line Home intravenous therapies (PICC only) 05/06/20 1727  Exposed Catheter (cm) 0 cm 05/06/20 1727  Site Assessment Clean;Dry;Intact 05/06/20 1727  Line Status Flushed;Saline locked;Blood return noted 05/06/20 1727  Dressing Type Transparent;Securing device 05/06/20 1727  Dressing Status Clean;Dry;Intact 05/06/20 1727  Antimicrobial disc in place? Yes 05/06/20 1727  Dressing Intervention New dressing 05/06/20 1727  Dressing Change Due 05/13/20 05/06/20 [place], [place] 05/06/2020, 5:28 PM

## 2020-05-06 NOTE — Plan of Care (Signed)
  Problem: Education: Goal: Knowledge of General Education information will improve Description: Including pain rating scale, medication(s)/side effects and non-pharmacologic comfort measures 05/06/2020 1113 by Emmaline Life, RN Outcome: Adequate for Discharge 05/06/2020 1113 by Emmaline Life, RN Outcome: Adequate for Discharge   Problem: Health Behavior/Discharge Planning: Goal: Ability to manage health-related needs will improve 05/06/2020 1113 by Emmaline Life, RN Outcome: Adequate for Discharge 05/06/2020 1113 by Emmaline Life, RN Outcome: Adequate for Discharge   Problem: Clinical Measurements: Goal: Ability to maintain clinical measurements within normal limits will improve 05/06/2020 1113 by Emmaline Life, RN Outcome: Adequate for Discharge 05/06/2020 1113 by Emmaline Life, RN Outcome: Adequate for Discharge Goal: Will remain free from infection 05/06/2020 1113 by Emmaline Life, RN Outcome: Adequate for Discharge 05/06/2020 1113 by Emmaline Life, RN Outcome: Adequate for Discharge Goal: Diagnostic test results will improve 05/06/2020 1113 by Emmaline Life, RN Outcome: Adequate for Discharge 05/06/2020 1113 by Emmaline Life, RN Outcome: Adequate for Discharge Goal: Respiratory complications will improve 05/06/2020 1113 by Emmaline Life, RN Outcome: Adequate for Discharge 05/06/2020 1113 by Emmaline Life, RN Outcome: Adequate for Discharge Goal: Cardiovascular complication will be avoided 05/06/2020 1113 by Emmaline Life, RN Outcome: Adequate for Discharge 05/06/2020 1113 by Emmaline Life, RN Outcome: Adequate for Discharge   Problem: Activity: Goal: Risk for activity intolerance will decrease 05/06/2020 1113 by Emmaline Life, RN Outcome: Adequate for Discharge 05/06/2020 1113 by Emmaline Life, RN Outcome: Adequate for Discharge   Problem: Nutrition: Goal: Adequate nutrition will be maintained 05/06/2020 1113 by Emmaline Life,  RN Outcome: Adequate for Discharge 05/06/2020 1113 by Emmaline Life, RN Outcome: Adequate for Discharge   Problem: Coping: Goal: Level of anxiety will decrease 05/06/2020 1113 by Emmaline Life, RN Outcome: Adequate for Discharge 05/06/2020 1113 by Emmaline Life, RN Outcome: Adequate for Discharge   Problem: Elimination: Goal: Will not experience complications related to bowel motility 05/06/2020 1113 by Emmaline Life, RN Outcome: Adequate for Discharge 05/06/2020 1113 by Emmaline Life, RN Outcome: Adequate for Discharge Goal: Will not experience complications related to urinary retention 05/06/2020 1113 by Emmaline Life, RN Outcome: Adequate for Discharge 05/06/2020 1113 by Emmaline Life, RN Outcome: Adequate for Discharge   Problem: Pain Managment: Goal: General experience of comfort will improve 05/06/2020 1113 by Emmaline Life, RN Outcome: Adequate for Discharge 05/06/2020 1113 by Emmaline Life, RN Outcome: Adequate for Discharge   Problem: Safety: Goal: Ability to remain free from injury will improve 05/06/2020 1113 by Emmaline Life, RN Outcome: Adequate for Discharge 05/06/2020 1113 by Emmaline Life, RN Outcome: Adequate for Discharge   Problem: Skin Integrity: Goal: Risk for impaired skin integrity will decrease 05/06/2020 1113 by Emmaline Life, RN Outcome: Adequate for Discharge 05/06/2020 1113 by Emmaline Life, RN Outcome: Adequate for Discharge

## 2020-05-06 NOTE — Discharge Summary (Addendum)
Physician Discharge Summary  Martin Clark IHK:[id number] DOB: 11-27-1945 DOA: 05/02/2020  PCP: Holland Commons, FNP  Admit date: 05/02/2020 Discharge date: 05/06/2020  Admitted From: home Disposition:  home  Recommendations for Outpatient Follow-up:  1. Follow up with PCP in 1-2 weeks 2. Please obtain BMP/CBC in one week 3. Follow up with infectious disease on 4/26  Home Health:HH RN Equipment/Devices:PICC line  Discharge Condition:stable CODE STATUS:full code Diet recommendation: heart healthy  Brief/Interim Summary: 75 y/o male admitted to the hospital with fever and AMS. He was found to have evidence of discitis on imaging of spine and blood cultures positive for MSSA. Neurosurgery following as well as ID.   Discharge Diagnoses:  Active Problems:   Multiple sclerosis (HCC)   Discitis   Hematuria   Hyponatremia   Hypokalemia   AKI (acute kidney injury) ([place])   Hypotension   Hypothyroidism   Bacteremia due to Staphylococcus aureus  MSSA bacteremia with discitis  -sepsis ruled out -MRI of T spine indicates possible infectious process at C7-T1 -MRI of C spine without any evidence of epidural abscess -he has 2 out of 2 sets positive for MSSA -repeat cultures sent 4/2 -Per neurosurgery, no operative management indicated -appreciate infectious disease assistance -2D echo without signs of vegetations -will need prolonged course of cefazolin -since repeat blood cultures did not show any growth, PICC line was placed and he will need to continue on cefazolin through 5/13 -outpatient follow up with ID has been scheduled.  Hematuria -CT renal stone study did not show any nephrolithiasis -renal cysts noted on CT -reports that hematuria is now resolved -follow up with urology as outpatient if hematuria recurs -urine culture with no growth  Iron deficiency anemia/AOCD -no evidence of bleeding -FOBT negative -can consider starting iron supplementation once acute  infectious process improved -continue to follow as an outpatient -transfuse for hemoglobin <7   History of multiple sclerosis -followed by Dr. Jannifer Franklin -takes glatiramer twice a week  AKI -last labs available for review are from 2011 -creatinine on admission 1.5 -he was taking NSAIDS, lisinopril and HCTZ prior to admission which currently on hold -no obstructive process on imaging -creatinine has normalized with IV fluids  Hypothyroidism -TSH normal -continue on synthroid  HLD.  -continue on statin  Acute metabolic encephalopathy -likely related to fever and cyclobenzaprine -mental status back to baseline now  HTN -holding lisinopril and HCTZ due to renal function -continue to follow BP  Hypokalemia -replaced -Mg normal  Discharge Instructions  Discharge Instructions    [facility] pharmacist to adjust dose for Vancomycin, Aminoglycosides and other anti-infective therapies as requested by physician.   Complete by: As directed    [facility] to provide Cath Flo 2mg    Complete by: As directed    Administer for PICC line occlusion and as ordered by physician for other access device issues.   Anaphylaxis Kit: Provided to treat any anaphylactic reaction to the medication being provided to the patient if First Dose or when requested by physician   Complete by: As directed    Epinephrine 1mg /ml vial / amp: Administer 0.3mg  (0.80ml) subcutaneously once for moderate to severe anaphylaxis, nurse to call physician and pharmacy when reaction occurs and call 911 if needed for immediate care   Diphenhydramine 50mg /ml IV vial: Administer 25-50mg  IV/IM PRN for first dose reaction, rash, itching, mild reaction, nurse to call physician and pharmacy when reaction occurs   Sodium Chloride 0.9% NS 571ml IV: Administer if needed for hypovolemic blood pressure  drop or as ordered by physician after call to physician with anaphylactic reaction   Change dressing on IV  access line weekly and PRN   Complete by: As directed    Diet - low sodium heart healthy   Complete by: As directed    Flush IV access with Sodium Chloride 0.9% and Heparin 10 units/ml or 100 units/ml   Complete by: As directed    Home infusion instructions - [facility]   Complete by: As directed    Instructions: Flush IV access with Sodium Chloride 0.9% and Heparin 10units/ml or 100units/ml   Change dressing on IV access line: Weekly and PRN   Instructions Cath Flo $Remove'2mg'PMSUzPE$ : Administer for PICC Line occlusion and as ordered by physician for other access device   [facility] pharmacist to adjust dose for: Vancomycin, Aminoglycosides and other anti-infective therapies as requested by physician   Increase activity slowly   Complete by: As directed    Method of administration may be changed at the discretion of home infusion pharmacist based upon assessment of the patient and/or caregiver's ability to self-administer the medication ordered   Complete by: As directed      Allergies as of 05/06/2020   No Known Allergies     Medication List    STOP taking these medications   cyclobenzaprine 5 MG tablet Commonly known as: FLEXERIL   lisinopril-hydrochlorothiazide 20-25 MG tablet Commonly known as: ZESTORETIC   meloxicam 15 MG tablet Commonly known as: MOBIC     TAKE these medications   aspirin EC 81 MG tablet Take 81 mg by mouth daily.   ceFAZolin  IVPB Commonly known as: ANCEF Inject 2 g into the vein every 8 (eight) hours. Indication: MSSA bacteremia with discitis First Dose: Yes Last Day of Therapy:  06/14/20 Labs - Once weekly:  CBC/D and BMP, Labs - Every other week:  ESR and CRP Method of administration: IV Push Method of administration may be changed at the discretion of home infusion pharmacist based upon assessment of the patient and/or caregiver's ability to self-administer the medication ordered.   cholecalciferol 25 MCG (1000 UNIT) tablet Commonly  known as: VITAMIN D3 Take 1,000 Units by mouth daily.   Glatiramer Acetate 40 MG/ML Sosy Inject 69ml ($RemoveBefo'40mg'aIvxiDuqPMv$ ) subcutaneously three times a week.   levothyroxine 150 MCG tablet Commonly known as: SYNTHROID Take 150 mcg by mouth daily before breakfast.   OVER THE COUNTER MEDICATION Take 1 tablet by mouth daily at 6 (six) AM. flexuran take daily   pramipexole 1 MG tablet Commonly known as: MIRAPEX Take 1 tablet (1 mg total) by mouth at bedtime. What changed: when to take this   rosuvastatin 10 MG tablet Commonly known as: CRESTOR Take 10 mg by mouth daily.   traMADol 50 MG tablet Commonly known as: ULTRAM Take 1 tablet (50 mg total) by mouth every 6 (six) hours as needed for moderate pain.   VITAMIN B 12 PO Take 1 tablet by mouth daily.            Discharge Care Instructions  (From admission, onward)         Start     Ordered   05/06/20 0000  Change dressing on IV access line weekly and PRN  (Home infusion instructions - [facility] )        05/06/20 1120          Follow-up Information    [facility] Follow up.   Why: ([facility])- home IV  abx arranged for 6wks end date 5/13[facility] will be coordinated for PICC line care and lab draws.              No Known Allergies  Consultations:  Neurosurgery  Infectious Disease   Procedures/Studies: CT Angio Chest PE W/Cm &/Or Wo Cm  Result Date: 05/02/2020 CLINICAL DATA:  Fever, altered mental status. EXAM: CT ANGIOGRAPHY CHEST WITH CONTRAST TECHNIQUE: Multidetector CT imaging of the chest was performed using the standard protocol during bolus administration of intravenous contrast. Multiplanar CT image reconstructions and MIPs were obtained to evaluate the vascular anatomy. CONTRAST:  OMNIPAQUE IOHEXOL 350 MG/ML SOLN COMPARISON:  December 12, 2009. FINDINGS: Cardiovascular: Satisfactory opacification of the pulmonary arteries to the segmental level. No evidence of pulmonary embolism. Normal  heart size. No pericardial effusion. Atherosclerosis of thoracic aorta is noted without aneurysm or dissection. Mediastinum/Nodes: Status post thyroidectomy. The esophagus is unremarkable. No significant adenopathy is noted. There is noted significantly increased prevertebral soft tissue thickening anterior to the upper thoracic spine. Lungs/Pleura: No pneumothorax or pleural effusion is noted. Mild bibasilar subsegmental atelectasis is noted posteriorly. Musculoskeletal: No chest wall abnormality. No acute or significant osseous findings. Review of the MIP images confirms the above findings. IMPRESSION: No definite evidence of pulmonary embolus. Significantly increased prevertebral soft tissue thickening is noted anterior to the upper thoracic spine of unknown etiology. MRI may be performed for further evaluation. Aortic Atherosclerosis (ICD10-I70.0). Electronically Signed   By: Lupita Raider M.D.   On: 05/02/2020 09:42   MR Cervical Spine W or Wo Contrast  Result Date: 05/03/2020 CLINICAL DATA:  Neck pain, acute, infection suspected. Abnormal thoracic spine MRI. EXAM: MRI CERVICAL SPINE WITHOUT AND WITH CONTRAST TECHNIQUE: Multiplanar and multiecho pulse sequences of the cervical spine, to include the craniocervical junction and cervicothoracic junction, were obtained without and with intravenous contrast. CONTRAST:  51mL GADAVIST GADOBUTROL 1 MMOL/ML IV SOLN COMPARISON:  MRI of the thoracic spine without and contrast 05/02/2020 FINDINGS: Alignment: 3 mm anterolisthesis present at C4-5. There is slight degenerative retrolisthesis C5-6. Straightening of the normal cervical lordosis is noted. Vertebrae: Increased T2 marrow signal is present at C5-6 with associated enhancement. Perivertebral edema and patchy enhancement is present from superior endplate of C6 through midbody of T1 on the left. Cord: There is slight dural enhancement at C5-6 and C6-7 both anteriorly and posteriorly. No epidural fluid collection  or epidural abscess is evident. Normal signal is present in the spinal cord. Moderate motion degrades the study. Posterior Fossa, vertebral arteries, paraspinal tissues: Craniocervical junction is within normal limits. Visualized intracranial contents are normal. Extensive soft tissue edema enhancement is noted anteriorly and on the left the C6-T1. Focal fluid collection to the left at the C7 level measures 20 x 6 mm. A collection more to the right at C7-T1 measures 21 mm cephalo caudad. Disc levels: C2-3: Asymmetric left-sided facet hypertrophy is present. Mild left foraminal narrowing is present. No significant stenosis present. C3-4: Asymmetric left-sided facet hypertrophy and uncovertebral spurring contributes to moderate left foraminal narrowing. There is partial effacement of ventral CSF. C4-5: A broad-based disc osteophyte complex is asymmetric to the left. Severe left and moderate right foraminal narrowing is present. Partial effacement of ventral CSF is noted. C5-6: No abnormal disc signal. Broad-based disc osteophyte complex partially effaces the ventral CSF. Moderate foraminal narrowing is present bilaterally. C6-7: No abnormal disc signal or enhancement. Moderate foraminal narrowing is worse on the left. C7-T1: Facet hypertrophy is present bilaterally. No significant stenosis is present.  IMPRESSION: 1. Abnormal marrow signal and enhancement at C5-6 consistent with acute osteomyelitis. 2. Perivertebral edema and enhancement from superior endplate of C6 through midbody of T1 on the left. Findings are consistent with infection. 3. Extensive anterior and left-sided paraspinous soft tissue edema and enhancement at C6-7 and C7-T1 with small fluid collections at C7-T1. Findings are consistent with infection. 4. Slight dural enhancement at C6-7 and C5-6 without evidence for epidural fluid collection or abscess. 5. Multilevel spondylosis of the cervical spine as described. 6. Moderate left foraminal narrowing at  C3-4. 7. Severe left and moderate right foraminal narrowing at C4-5. 8. Moderate foraminal narrowing bilaterally at C5-6. 9. Moderate foraminal narrowing bilaterally at C6-7 is worse on the left. 10. Facet hypertrophy at C7-T1 without significant stenosis. Electronically Signed   By: San Morelle M.D.   On: 05/03/2020 17:36   MR THORACIC SPINE W WO CONTRAST  Result Date: 05/02/2020 CLINICAL DATA:  Intervertebral disc disorder EXAM: MRI THORACIC AND LUMBAR SPINE WITHOUT AND WITH CONTRAST TECHNIQUE: Multiplanar and multiecho pulse sequences of the thoracic and lumbar spine were obtained without and with intravenous contrast. CONTRAST:  1mL GADAVIST GADOBUTROL 1 MMOL/ML IV SOLN COMPARISON:  CT chest May 02, 2020. FINDINGS: Motion limited evaluation.  Within this limitation: MRI THORACIC SPINE FINDINGS Alignment:  Normal alignment. Vertebrae: Vertebral body heights are maintained. No specific evidence of acute fracture, discitis/osteomyelitis, or suspicious bone lesion in the thoracic spine. On the counter sequence there is T1 hypointensity of the lower cervical vertebral bodies, indeterminate but potentially represent edema versus degenerative change. Cord: Limited evaluation due to motion without definite cord signal abnormality. Paraspinal and other soft tissues: Abnormal prevertebral and paraspinal edema and enhancement at the cervicothoracic junction. On the counter sequence, there is suggestion of this process extending superiorly with prevertebral edema in the cervical spine Susspected small fluid collection ventrally at C7-T1 and possible thin ventral enhancement of the epidural canal at this level (series 33, image 6). Disc levels: Central disc protrusion at C7-T1 is partially imaged on axial sequences and contacts and deforms the ventral cord without significant canal stenosis. Small central disc protrusion at T6-T7 and right eccentric disc protrusion at T7-T8 the ventral cord without  significant canal stenosis. Posterior disc bulging at T10-T11 with left eccentric annular fissure, bilateral facet hypertrophy, and ligamentum flavum thickening. Resulting mild to moderate canal stenosis and moderate right and mild left foraminal stenosis. MRI LUMBAR SPINE FINDINGS Segmentation:  Transitional lumbosacral anatomy with lumbarized S1. Alignment:  No substantial sagittal subluxation. Vertebrae: Vertebral body heights are maintained. No specific evidence of acute fracture, discitis/osteomyelitis, or suspicious bone lesion. Degenerative Schmorl's nodes at multiple levels. Discogenic/degenerative endplate signal changes at multiple levels. Conus medullaris: Extends to the T12-L1 level and appears normal. Paraspinal and other soft tissues: Negative Disc levels: T12-L1: Right eccentric disc bulging and bilateral facet hypertrophy and ligamentum flavum thickening. Mild right subarticular recess stenosis and mild right foraminal stenosis. No significant central canal or left foraminal stenosis. L1-L2: Broad disc bulge, bilateral facet hypertrophy and ligamentum flavum thickening. Resulting moderate to severe canal stenosis and mild left foraminal stenosis. L2-L3: Broad disc bulge, bilateral facet hypertrophy, and ligamentum flavum thickening. Mild left foraminal stenosis without significant canal or right foraminal stenosis. L3-L4: Disc height loss and desiccation. Disc bulge and endplate spurring. Facet hypertrophy and ligamentum flavum thickening. Mild left foraminal stenosis. No significant central canal or right foraminal stenosis. Mild right subarticular recess narrowing. L4-L5: Endplate spurring. Bilateral facet hypertrophy. Mild to moderate bilateral foraminal stenosis. No  significant central canal stenosis. L5-S1: Endplate spurring. Bilateral facet hypertrophy. Moderate bilateral foraminal stenosis. No significant canal stenosis. S1-S2: Bilateral facet hypertrophy with mild left foraminal stenosis.  IMPRESSION: MRI thoracic spine: 1. Abnormal prevertebral and paraspinal edema and enhancement at the cervicothoracic junction which is partially imaged on this study. Suspected small pervertbral fluid collection at C7-T1 and possible thin ventral enhancement of the epidural canal at this level. On the counter sequence, this process appears to extend cranially with prevertebral edema in the cervical spine and possible abnormal marrow edema of the lower cervical vertebral bodies versus degenerative change. Findings are concerning for infection, but are incompletely characterized. Recommend MRI of the cervical spine with contrast to further characterize and evaluate for discitis/osteomyelitis. 2. At T10-T11 there is mild to moderate canal stenosis and moderate right and mild left foraminal stenosis. 3. Small disc protrusions at T6-T7 and T7-T8 flatten the ventral cord without significant canal stenosis. 4. Central disc protrusion at C7-T1 is partially imaged on axial sequences and contacts and deforms the ventral cord without significant canal stenosis. The recommended MRI of the cervical spine can further characterize. MRI lumbar spine: 1. Transitional lumbosacral anatomy with lumbarized S1. 2. Moderate to severe canal stenosis at L2-L3. 3. Bilateral moderate foraminal stenosis at L5-S1 and mild to moderate foraminal stenosis at L4-L5. Mild foraminal stenosis on the left at L1-L2 and L3-L4 and the right at T12-L1. Findings and recommendations discussed with Dr. Effie Shy via telephone at 1:28 PM Electronically Signed   By: Feliberto Harts MD   On: 05/02/2020 13:35   MR Lumbar Spine W Wo Contrast  Result Date: 05/02/2020 CLINICAL DATA:  Intervertebral disc disorder EXAM: MRI THORACIC AND LUMBAR SPINE WITHOUT AND WITH CONTRAST TECHNIQUE: Multiplanar and multiecho pulse sequences of the thoracic and lumbar spine were obtained without and with intravenous contrast. CONTRAST:  78mL GADAVIST GADOBUTROL 1 MMOL/ML IV SOLN  COMPARISON:  CT chest May 02, 2020. FINDINGS: Motion limited evaluation.  Within this limitation: MRI THORACIC SPINE FINDINGS Alignment:  Normal alignment. Vertebrae: Vertebral body heights are maintained. No specific evidence of acute fracture, discitis/osteomyelitis, or suspicious bone lesion in the thoracic spine. On the counter sequence there is T1 hypointensity of the lower cervical vertebral bodies, indeterminate but potentially represent edema versus degenerative change. Cord: Limited evaluation due to motion without definite cord signal abnormality. Paraspinal and other soft tissues: Abnormal prevertebral and paraspinal edema and enhancement at the cervicothoracic junction. On the counter sequence, there is suggestion of this process extending superiorly with prevertebral edema in the cervical spine Susspected small fluid collection ventrally at C7-T1 and possible thin ventral enhancement of the epidural canal at this level (series 33, image 6). Disc levels: Central disc protrusion at C7-T1 is partially imaged on axial sequences and contacts and deforms the ventral cord without significant canal stenosis. Small central disc protrusion at T6-T7 and right eccentric disc protrusion at T7-T8 the ventral cord without significant canal stenosis. Posterior disc bulging at T10-T11 with left eccentric annular fissure, bilateral facet hypertrophy, and ligamentum flavum thickening. Resulting mild to moderate canal stenosis and moderate right and mild left foraminal stenosis. MRI LUMBAR SPINE FINDINGS Segmentation:  Transitional lumbosacral anatomy with lumbarized S1. Alignment:  No substantial sagittal subluxation. Vertebrae: Vertebral body heights are maintained. No specific evidence of acute fracture, discitis/osteomyelitis, or suspicious bone lesion. Degenerative Schmorl's nodes at multiple levels. Discogenic/degenerative endplate signal changes at multiple levels. Conus medullaris: Extends to the T12-L1 level and  appears normal. Paraspinal and other soft tissues: Negative Disc levels:  T12-L1: Right eccentric disc bulging and bilateral facet hypertrophy and ligamentum flavum thickening. Mild right subarticular recess stenosis and mild right foraminal stenosis. No significant central canal or left foraminal stenosis. L1-L2: Broad disc bulge, bilateral facet hypertrophy and ligamentum flavum thickening. Resulting moderate to severe canal stenosis and mild left foraminal stenosis. L2-L3: Broad disc bulge, bilateral facet hypertrophy, and ligamentum flavum thickening. Mild left foraminal stenosis without significant canal or right foraminal stenosis. L3-L4: Disc height loss and desiccation. Disc bulge and endplate spurring. Facet hypertrophy and ligamentum flavum thickening. Mild left foraminal stenosis. No significant central canal or right foraminal stenosis. Mild right subarticular recess narrowing. L4-L5: Endplate spurring. Bilateral facet hypertrophy. Mild to moderate bilateral foraminal stenosis. No significant central canal stenosis. L5-S1: Endplate spurring. Bilateral facet hypertrophy. Moderate bilateral foraminal stenosis. No significant canal stenosis. S1-S2: Bilateral facet hypertrophy with mild left foraminal stenosis. IMPRESSION: MRI thoracic spine: 1. Abnormal prevertebral and paraspinal edema and enhancement at the cervicothoracic junction which is partially imaged on this study. Suspected small pervertbral fluid collection at C7-T1 and possible thin ventral enhancement of the epidural canal at this level. On the counter sequence, this process appears to extend cranially with prevertebral edema in the cervical spine and possible abnormal marrow edema of the lower cervical vertebral bodies versus degenerative change. Findings are concerning for infection, but are incompletely characterized. Recommend MRI of the cervical spine with contrast to further characterize and evaluate for discitis/osteomyelitis. 2. At  T10-T11 there is mild to moderate canal stenosis and moderate right and mild left foraminal stenosis. 3. Small disc protrusions at T6-T7 and T7-T8 flatten the ventral cord without significant canal stenosis. 4. Central disc protrusion at C7-T1 is partially imaged on axial sequences and contacts and deforms the ventral cord without significant canal stenosis. The recommended MRI of the cervical spine can further characterize. MRI lumbar spine: 1. Transitional lumbosacral anatomy with lumbarized S1. 2. Moderate to severe canal stenosis at L2-L3. 3. Bilateral moderate foraminal stenosis at L5-S1 and mild to moderate foraminal stenosis at L4-L5. Mild foraminal stenosis on the left at L1-L2 and L3-L4 and the right at T12-L1. Findings and recommendations discussed with Dr. Eulis Foster via telephone at 1:28 PM Electronically Signed   By: Margaretha Sheffield MD   On: 05/02/2020 13:35   DG Chest Port 1 View  Result Date: 05/02/2020 CLINICAL DATA:  Cough, fever EXAM: PORTABLE CHEST 1 VIEW COMPARISON:  None. FINDINGS: Mild bibasilar atelectasis. Lungs are otherwise clear. No pneumothorax or pleural effusion. Cardiac size within normal limits. Pulmonary vascularity is normal. No acute bone abnormality. IMPRESSION: No active disease. Electronically Signed   By: Fidela Salisbury MD   On: 05/02/2020 06:06   ECHOCARDIOGRAM COMPLETE  Result Date: 05/04/2020    ECHOCARDIOGRAM REPORT   Patient Name:   Martin Clark Date of Exam: 05/03/2020 Medical Rec #:  [id number]   Height:       70.0 in Accession #:    [id number]  Weight:       224.0 lb Date of Birth:  1946/01/09   BSA:          2.190 m Patient Age:    75 years    BP:           114/64 mmHg Patient Gender: M           HR:           69 bpm. Exam Location:  Inpatient Procedure: 2D Echo, Cardiac Doppler and Color Doppler Indications:    Bacteremia R78.81  History:        Patient has no prior history of Echocardiogram examinations.                 Risk Factors:Hypertension and Dyslipidemia.  Cancer. Multilple                 Sclerosis.  Sonographer:    Jonelle Sidle Dance Referring Phys: [place]  1. Left ventricular ejection fraction, by estimation, is 60 to 65%. The left ventricle has normal function. The left ventricle has no regional wall motion abnormalities. There is mild left ventricular hypertrophy. Left ventricular diastolic parameters are consistent with Grade I diastolic dysfunction (impaired relaxation).  2. Right ventricular systolic function is normal. The right ventricular size is normal.  3. Left atrial size was moderately dilated.  4. The mitral valve is normal in structure. Mild mitral valve regurgitation. No evidence of mitral stenosis.  5. The aortic valve is calcified. There is moderate calcification of the aortic valve. There is mild thickening of the aortic valve. Aortic valve regurgitation is not visualized. No aortic stenosis is present.  6. There is borderline dilatation of the ascending aorta, measuring 39 mm.  7. The inferior vena cava is dilated in size with >50% respiratory variability, suggesting right atrial pressure of 8 mmHg. Conclusion(s)/Recommendation(s): No evidence of valvular vegetations on this transthoracic echocardiogram. Would recommend a transesophageal echocardiogram to exclude infective endocarditis if clinically indicated. FINDINGS  Left Ventricle: Left ventricular ejection fraction, by estimation, is 60 to 65%. The left ventricle has normal function. The left ventricle has no regional wall motion abnormalities. The left ventricular internal cavity size was normal in size. There is  mild left ventricular hypertrophy. Left ventricular diastolic parameters are consistent with Grade I diastolic dysfunction (impaired relaxation). Right Ventricle: The right ventricular size is normal. No increase in right ventricular wall thickness. Right ventricular systolic function is normal. Left Atrium: Left atrial size was moderately dilated. Right  Atrium: Right atrial size was normal in size. Pericardium: There is no evidence of pericardial effusion. Mitral Valve: The mitral valve is normal in structure. Mild mitral valve regurgitation. No evidence of mitral valve stenosis. Tricuspid Valve: The tricuspid valve is normal in structure. Tricuspid valve regurgitation is not demonstrated. No evidence of tricuspid stenosis. Aortic Valve: The aortic valve is calcified. There is moderate calcification of the aortic valve. There is mild thickening of the aortic valve. There is mild to moderate aortic valve annular calcification. Aortic valve regurgitation is not visualized. No  aortic stenosis is present. Pulmonic Valve: The pulmonic valve was normal in structure. Pulmonic valve regurgitation is trivial. No evidence of pulmonic stenosis. Aorta: The aortic root is normal in size and structure. There is borderline dilatation of the ascending aorta, measuring 39 mm. Venous: The inferior vena cava is dilated in size with greater than 50% respiratory variability, suggesting right atrial pressure of 8 mmHg. IAS/Shunts: No atrial level shunt detected by color flow Doppler.  LEFT VENTRICLE PLAX 2D LVIDd:         5.00 cm  Diastology LVIDs:         3.40 cm  LV e' medial:    11.00 cm/s LV PW:         1.30 cm  LV E/e' medial:  11.1 LV IVS:        1.00 cm  LV e' lateral:   11.50 cm/s LVOT diam:     2.30 cm  LV E/e' lateral: 10.6 LV SV:  115 LV SV Index:   52 LVOT Area:     4.15 cm  RIGHT VENTRICLE             IVC RV Basal diam:  2.90 cm     IVC diam: 2.50 cm RV S prime:     12.50 cm/s TAPSE (M-mode): 2.4 cm LEFT ATRIUM             Index       RIGHT ATRIUM           Index LA diam:        5.00 cm 2.28 cm/m  RA Area:     19.40 cm LA Vol (A2C):   84.4 ml 38.53 ml/m RA Volume:   57.00 ml  26.02 ml/m LA Vol (A4C):   93.1 ml 42.51 ml/m LA Biplane Vol: 90.0 ml 41.09 ml/m  AORTIC VALVE LVOT Vmax:   138.00 cm/s LVOT Vmean:  86.600 cm/s LVOT VTI:    0.276 m  AORTA Ao Root diam:  3.90 cm Ao Asc diam:  3.90 cm MITRAL VALVE MV Area (PHT): 3.08 cm     SHUNTS MV Decel Time: 246 msec     Systemic VTI:  0.28 m MV E velocity: 122.00 cm/s  Systemic Diam: 2.30 cm MV A velocity: 74.70 cm/s MV E/A ratio:  1.63 Candee Furbish MD Electronically signed by Candee Furbish MD Signature Date/Time: 05/04/2020/7:08:30 AM    Final    CT Renal Stone Study  Result Date: 05/02/2020 CLINICAL DATA:  Fever and altered mental status. Increased urinary frequency. EXAM: CT ABDOMEN AND PELVIS WITHOUT CONTRAST TECHNIQUE: Multidetector CT imaging of the abdomen and pelvis was performed following the standard protocol without IV contrast. COMPARISON:  CT abdomen pelvis dated September 03, 2011. FINDINGS: Lower chest: No acute abnormality. Dependent subsegmental atelectasis in both lower lobes. Hypodense blood pool relative to myocardium, suggestive of anemia. Hepatobiliary: Multiple hepatic cysts are stable or slightly increased in size since 2013. Unchanged 3.1 cm hypodense lesion in the inferior right hepatic lobe, previously characterized as a hemangioma. The gallbladder is unremarkable. No biliary dilatation. Pancreas: Unremarkable. No pancreatic ductal dilatation or surrounding inflammatory changes. Spleen: Normal in size without focal abnormality. Adrenals/Urinary Tract: The adrenal glands and right kidney are unremarkable. Slight interval increase in size of the large left renal simple cyst, currently measuring 9.1 cm, previously 7.9 cm. No renal calculi or hydronephrosis. The bladder is unremarkable for the degree of distention. Stomach/Bowel: Stomach is within normal limits. Appendix appears normal. No evidence of bowel wall thickening, distention, or inflammatory changes. Vascular/Lymphatic: Aortic atherosclerosis. No enlarged abdominal or pelvic lymph nodes. Reproductive: Prostate is unremarkable. Other: Unchanged small fat containing left inguinal hernia. No free fluid or pneumoperitoneum. Musculoskeletal: No acute or  significant osseous findings. Old right-sided rib fractures. IMPRESSION: 1. No acute intra-abdominal process. 2. Aortic Atherosclerosis (ICD10-I70.0). Electronically Signed   By: Titus Dubin M.D.   On: 05/02/2020 09:51   [place] EKG SITE RITE  Result Date: 05/06/2020 If Site Rite image not attached, placement could not be confirmed due to current cardiac rhythm.      Subjective: Feeling better, back pain improved  Discharge Exam: Vitals:   05/05/20 2058 05/05/20 2338 05/06/20 0500 05/06/20 0740  BP: (!) 150/79 139/63 139/75 122/66  Pulse: 76 66 68 70  Resp: $Remo'20 20 16 20  'BJtpg$ Temp: 98.6 F (37 C) 98.5 F (36.9 C) (!) 97.4 F (36.3 C) 99.2 F (37.3 C)  TempSrc: Oral Oral Oral   SpO2: 95%  100% 98% 98%  Weight:      Height:        General: Pt is alert, awake, not in acute distress Cardiovascular: RRR, S1/S2 +, no rubs, no gallops Respiratory: CTA bilaterally, no wheezing, no rhonchi Abdominal: Soft, NT, ND, bowel sounds + Extremities: no edema, no cyanosis    The results of significant diagnostics from this hospitalization (including imaging, microbiology, ancillary and laboratory) are listed below for reference.     Microbiology: Recent Results (from the past 240 hour(s))  Resp Panel by RT-PCR (Flu A&B, Covid) Nasopharyngeal Swab     Status: None   Collection Time: 05/02/20  6:35 AM   Specimen: Nasopharyngeal Swab; Nasopharyngeal(NP) swabs in vial transport medium  Result Value Ref Range Status   SARS Coronavirus 2 by RT PCR NEGATIVE NEGATIVE Final    Comment: (NOTE) SARS-CoV-2 target nucleic acids are NOT DETECTED.  The SARS-CoV-2 RNA is generally detectable in upper respiratory specimens during the acute phase of infection. The lowest concentration of SARS-CoV-2 viral copies this assay can detect is 138 copies/mL. A negative result does not preclude SARS-Cov-2 infection and should not be used as the sole basis for treatment or other patient management decisions. A  negative result may occur with  improper specimen collection/handling, submission of specimen other than nasopharyngeal swab, presence of viral mutation(s) within the areas targeted by this assay, and inadequate number of viral copies(<138 copies/mL). A negative result must be combined with clinical observations, patient history, and epidemiological information. The expected result is Negative.  Fact Sheet for Patients:  EntrepreneurPulse.com.au  Fact Sheet for Healthcare Providers:  IncredibleEmployment.be  This test is no t yet approved or cleared by the [place] FDA and  has been authorized for detection and/or diagnosis of SARS-CoV-2 by FDA under an Emergency Use Authorization (EUA). This EUA will remain  in effect (meaning this test can be used) for the duration of the COVID-19 declaration under Section 564(b)(1) of the Act, 21 U.S.C.section 360bbb-3(b)(1), unless the authorization is terminated  or revoked sooner.       Influenza A by PCR NEGATIVE NEGATIVE Final   Influenza B by PCR NEGATIVE NEGATIVE Final    Comment: (NOTE) The Xpert Xpress SARS-CoV-2/FLU/RSV plus assay is intended as an aid in the diagnosis of influenza from Nasopharyngeal swab specimens and should not be used as a sole basis for treatment. Nasal washings and aspirates are unacceptable for Xpert Xpress SARS-CoV-2/FLU/RSV testing.  Fact Sheet for Patients: EntrepreneurPulse.com.au  Fact Sheet for Healthcare Providers: IncredibleEmployment.be  This test is not yet approved or cleared by the [place] FDA and has been authorized for detection and/or diagnosis of SARS-CoV-2 by FDA under an Emergency Use Authorization (EUA). This EUA will remain in effect (meaning this test can be used) for the duration of the COVID-19 declaration under Section 564(b)(1) of the Act, 21 U.S.C. section 360bbb-3(b)(1), unless the authorization  is terminated or revoked.  Performed at [facility], [place] [address] [place], [place] [address]   Blood Culture (routine x 2)     Status: Abnormal   Collection Time: 05/02/20  6:35 AM   Specimen: BLOOD  Result Value Ref Range Status   Specimen Description   Final    BLOOD RIGHT ANTECUBITAL Performed at [place] [address] [place], [place] [address]    Special Requests   Final    BOTTLES DRAWN AEROBIC AND ANAEROBIC Blood Culture results may not be optimal due to an excessive volume of  blood received in culture bottles Performed at [facility], [place] [address], [place], [place] [address]    Culture  Setup Time   Final    GRAM POSITIVE COCCI IN CLUSTERS IN BOTH AEROBIC AND ANAEROBIC BOTTLES CRITICAL RESULT CALLED TO, READ BACK BY AND VERIFIED WITH: Rockey Situ [facility] 05/02/20 2139 JDW Performed at [facility], [place] [address] [place], [place] [address]    Culture STAPHYLOCOCCUS AUREUS (A)  Final   Report Status 05/04/2020 FINAL  Final   Organism ID, Bacteria STAPHYLOCOCCUS AUREUS  Final      Susceptibility   Staphylococcus aureus - MIC*    CIPROFLOXACIN <=0.5 SENSITIVE Sensitive     ERYTHROMYCIN <=0.25 SENSITIVE Sensitive     GENTAMICIN <=0.5 SENSITIVE Sensitive     OXACILLIN 0.5 SENSITIVE Sensitive     TETRACYCLINE >=16 RESISTANT Resistant     VANCOMYCIN <=0.5 SENSITIVE Sensitive     TRIMETH/SULFA <=10 SENSITIVE Sensitive     CLINDAMYCIN <=0.25 SENSITIVE Sensitive     RIFAMPIN <=0.5 SENSITIVE Sensitive     Inducible Clindamycin NEGATIVE Sensitive     * STAPHYLOCOCCUS AUREUS  Blood Culture (routine x 2)     Status: Abnormal   Collection Time: 05/02/20  6:35 AM   Specimen: BLOOD  Result Value Ref Range Status   Specimen Description   Final    BLOOD BLOOD LEFT HAND Performed at [place] [address], [place], [place] [address]    Special Requests   Final    BOTTLES DRAWN AEROBIC  AND ANAEROBIC Blood Culture adequate volume Performed at [place] [address], [place], [place] [address]    Culture  Setup Time   Final    GRAM POSITIVE COCCI IN CLUSTERS IN BOTH AEROBIC AND ANAEROBIC BOTTLES CRITICAL VALUE NOTED.  VALUE IS CONSISTENT WITH PREVIOUSLY REPORTED AND CALLED VALUE.    Culture (A)  Final    STAPHYLOCOCCUS AUREUS SUSCEPTIBILITIES PERFORMED ON PREVIOUS CULTURE WITHIN THE LAST 5 DAYS. Performed at [facility], [place] [address], [place], [place] [address]    Report Status 05/04/2020 FINAL  Final  Blood Culture ID Panel (Reflexed)     Status: Abnormal   Collection Time: 05/02/20  6:35 AM  Result Value Ref Range Status   Enterococcus faecalis NOT DETECTED NOT DETECTED Final   Enterococcus Faecium NOT DETECTED NOT DETECTED Final   Listeria monocytogenes NOT DETECTED NOT DETECTED Final   Staphylococcus species DETECTED (A) NOT DETECTED Final    Comment: CRITICAL RESULT CALLED TO, READ BACK BY AND VERIFIED WITH: Rockey Situ [facility] 05/02/20 2139 JDW    Staphylococcus aureus (BCID) DETECTED (A) NOT DETECTED Final    Comment: CRITICAL RESULT CALLED TO, READ BACK BY AND VERIFIED WITH: Rockey Situ [facility] 05/02/20 2139 JDW    Staphylococcus epidermidis NOT DETECTED NOT DETECTED Final   Staphylococcus lugdunensis NOT DETECTED NOT DETECTED Final   Streptococcus species NOT DETECTED NOT DETECTED Final   Streptococcus agalactiae NOT DETECTED NOT DETECTED Final   Streptococcus pneumoniae NOT DETECTED NOT DETECTED Final   Streptococcus pyogenes NOT DETECTED NOT DETECTED Final   A.calcoaceticus-baumannii NOT DETECTED NOT DETECTED Final   Bacteroides fragilis NOT DETECTED NOT DETECTED Final   Enterobacterales NOT DETECTED NOT DETECTED Final   Enterobacter cloacae complex NOT DETECTED NOT DETECTED Final   Escherichia coli NOT DETECTED NOT DETECTED Final   Klebsiella aerogenes NOT DETECTED NOT DETECTED Final   Klebsiella oxytoca NOT DETECTED NOT  DETECTED Final  Klebsiella pneumoniae NOT DETECTED NOT DETECTED Final   Proteus species NOT DETECTED NOT DETECTED Final   Salmonella species NOT DETECTED NOT DETECTED Final   Serratia marcescens NOT DETECTED NOT DETECTED Final   Haemophilus influenzae NOT DETECTED NOT DETECTED Final   Neisseria meningitidis NOT DETECTED NOT DETECTED Final   Pseudomonas aeruginosa NOT DETECTED NOT DETECTED Final   Stenotrophomonas maltophilia NOT DETECTED NOT DETECTED Final   Candida albicans NOT DETECTED NOT DETECTED Final   Candida auris NOT DETECTED NOT DETECTED Final   Candida glabrata NOT DETECTED NOT DETECTED Final   Candida krusei NOT DETECTED NOT DETECTED Final   Candida parapsilosis NOT DETECTED NOT DETECTED Final   Candida tropicalis NOT DETECTED NOT DETECTED Final   Cryptococcus neoformans/gattii NOT DETECTED NOT DETECTED Final   Meth resistant mecA/C and MREJ NOT DETECTED NOT DETECTED Final    Comment: Performed at [facility], [place] [address], [place], [place] [address]  Urine culture     Status: None   Collection Time: 05/02/20  7:52 AM   Specimen: In/Out Cath Urine  Result Value Ref Range Status   Specimen Description   Final    IN/OUT CATH URINE Performed at [place] [address] [place], [place] [address]    Special Requests   Final    NONE Performed at [facility], [place] [address] [place], [place] [address]    Culture   Final    NO GROWTH Performed at [facility], [place] [address] [place], [place] [address]    Report Status 05/03/2020 FINAL  Final  MRSA PCR Screening     Status: None   Collection Time: 05/02/20  6:32 PM   Specimen: Nasopharyngeal  Result Value Ref Range Status   MRSA by PCR NEGATIVE NEGATIVE Final    Comment:        The GeneXpert MRSA Assay (FDA approved for NASAL specimens only), is one component of a comprehensive MRSA colonization surveillance program. It is not intended to diagnose  MRSA infection nor to guide or monitor treatment for MRSA infections. Performed at [facility], [place] [address] [place], [place] [address]   Culture, blood (routine x 2)     Status: None (Preliminary result)   Collection Time: 05/04/20  4:54 AM   Specimen: BLOOD RIGHT ARM  Result Value Ref Range Status   Specimen Description BLOOD RIGHT ARM  Final   Special Requests   Final    AEROBIC BOTTLE ONLY Blood Culture results may not be optimal due to an inadequate volume of blood received in culture bottles   Culture   Final    NO GROWTH 1 DAY Performed at [facility], [place] [address] [place], [place] [address]    Report Status PENDING  Incomplete  Culture, blood (routine x 2)     Status: None (Preliminary result)   Collection Time: 05/04/20  5:08 AM   Specimen: BLOOD LEFT ARM  Result Value Ref Range Status   Specimen Description BLOOD LEFT ARM  Final   Special Requests   Final    AEROBIC BOTTLE ONLY Blood Culture results may not be optimal due to an inadequate volume of blood received in culture bottles   Culture   Final    NO GROWTH 1 DAY Performed at [facility], [place] [address], [place], [place] [address]    Report Status PENDING  Incomplete     Labs: BNP (last 3 results)  No results for input(s): BNP in the last 8760 hours. Basic Metabolic Panel: Recent Labs  Lab 05/02/20 0651 05/02/20 0653 05/02/20 1823 05/03/20 0110 05/04/20 0454 05/05/20 0332 05/06/20 0305  NA  --    < > 130* 132* 133* 137 137  K  --    < > 3.3* 3.2* 3.5 3.6 4.0  CL  --    < > 96* 97* 98 102 101  CO2  --   --  $R'27 28 27 29 28  'bO$ GLUCOSE  --    < > 107* 117* 105* 111* 117*  BUN  --    < > 38* 35* 28* 20 20  CREATININE  --    < > 1.50* 1.32* 1.14 1.00 1.07  CALCIUM  --   --  7.9* 8.0* 8.5* 8.3* 8.8*  MG 1.8  --   --  2.0  --   --   --    < > = values in this interval not displayed.   Liver Function Tests: Recent Labs  Lab 05/02/20 0635 05/03/20 0110 05/04/20 0454  05/05/20 0332 05/06/20 0305  AST 25 21 36 37 41  ALT $Re'26 23 28 28 'qEi$ 34  ALKPHOS 47 42 50 45 53  BILITOT 1.2 0.7 0.6 0.2* 0.3  PROT 6.2* 4.9* 5.9* 5.5* 6.1*  ALBUMIN 3.0* 2.2* 2.6* 2.4* 2.6*   No results for input(s): LIPASE, AMYLASE in the last 168 hours. No results for input(s): AMMONIA in the last 168 hours. CBC: Recent Labs  Lab 05/02/20 0635 05/02/20 0653 05/03/20 0110 05/04/20 0454 05/05/20 0332 05/06/20 0305  WBC 9.4  --  7.2 9.0 7.5 7.6  NEUTROABS 8.3*  --   --   --   --   --   HGB 9.8* 9.2* 8.3* 9.3* 9.0* 9.9*  HCT 29.0* 27.0* 24.2* 28.2* 26.9* 29.8*  MCV 92.7  --  92.7 94.3 92.4 93.1  PLT 168  --  132* 211 208 265   Cardiac Enzymes: Recent Labs  Lab 05/03/20 0110  CKTOTAL 79   BNP: Invalid input(s): POCBNP CBG: No results for input(s): GLUCAP in the last 168 hours. D-Dimer No results for input(s): DDIMER in the last 72 hours. Hgb A1c No results for input(s): HGBA1C in the last 72 hours. Lipid Profile No results for input(s): CHOL, HDL, LDLCALC, TRIG, CHOLHDL, LDLDIRECT in the last 72 hours. Thyroid function studies No results for input(s): TSH, T4TOTAL, T3FREE, THYROIDAB in the last 72 hours.  Invalid input(s): FREET3 Anemia work up No results for input(s): VITAMINB12, FOLATE, FERRITIN, TIBC, IRON, RETICCTPCT in the last 72 hours. Urinalysis    Component Value Date/Time   COLORURINE YELLOW 05/02/2020 0752   APPEARANCEUR HAZY (A) 05/02/2020 0752   LABSPEC 1.021 05/02/2020 0752   PHURINE 5.0 05/02/2020 0752   GLUCOSEU NEGATIVE 05/02/2020 0752   HGBUR MODERATE (A) 05/02/2020 0752   BILIRUBINUR NEGATIVE 05/02/2020 0752   KETONESUR NEGATIVE 05/02/2020 0752   PROTEINUR 30 (A) 05/02/2020 0752   NITRITE NEGATIVE 05/02/2020 0752   LEUKOCYTESUR NEGATIVE 05/02/2020 0752   Sepsis Labs Invalid input(s): PROCALCITONIN,  WBC,  LACTICIDVEN Microbiology Recent Results (from the past 240 hour(s))  Resp Panel by RT-PCR (Flu A&B, Covid) Nasopharyngeal Swab      Status: None   Collection Time: 05/02/20  6:35 AM   Specimen: Nasopharyngeal Swab; Nasopharyngeal(NP) swabs in vial transport medium  Result Value Ref Range Status   SARS Coronavirus 2 by RT PCR NEGATIVE NEGATIVE Final    Comment: (NOTE) SARS-CoV-2 target  nucleic acids are NOT DETECTED.  The SARS-CoV-2 RNA is generally detectable in upper respiratory specimens during the acute phase of infection. The lowest concentration of SARS-CoV-2 viral copies this assay can detect is 138 copies/mL. A negative result does not preclude SARS-Cov-2 infection and should not be used as the sole basis for treatment or other patient management decisions. A negative result may occur with  improper specimen collection/handling, submission of specimen other than nasopharyngeal swab, presence of viral mutation(s) within the areas targeted by this assay, and inadequate number of viral copies(<138 copies/mL). A negative result must be combined with clinical observations, patient history, and epidemiological information. The expected result is Negative.  Fact Sheet for Patients:  EntrepreneurPulse.com.au  Fact Sheet for Healthcare Providers:  IncredibleEmployment.be  This test is no t yet approved or cleared by the [place] FDA and  has been authorized for detection and/or diagnosis of SARS-CoV-2 by FDA under an Emergency Use Authorization (EUA). This EUA will remain  in effect (meaning this test can be used) for the duration of the COVID-19 declaration under Section 564(b)(1) of the Act, 21 U.S.C.section 360bbb-3(b)(1), unless the authorization is terminated  or revoked sooner.       Influenza A by PCR NEGATIVE NEGATIVE Final   Influenza B by PCR NEGATIVE NEGATIVE Final    Comment: (NOTE) The Xpert Xpress SARS-CoV-2/FLU/RSV plus assay is intended as an aid in the diagnosis of influenza from Nasopharyngeal swab specimens and should not be used as a sole basis  for treatment. Nasal washings and aspirates are unacceptable for Xpert Xpress SARS-CoV-2/FLU/RSV testing.  Fact Sheet for Patients: EntrepreneurPulse.com.au  Fact Sheet for Healthcare Providers: IncredibleEmployment.be  This test is not yet approved or cleared by the [place] FDA and has been authorized for detection and/or diagnosis of SARS-CoV-2 by FDA under an Emergency Use Authorization (EUA). This EUA will remain in effect (meaning this test can be used) for the duration of the COVID-19 declaration under Section 564(b)(1) of the Act, 21 U.S.C. section 360bbb-3(b)(1), unless the authorization is terminated or revoked.  Performed at [facility], [place] [address] [place], [place] [address]   Blood Culture (routine x 2)     Status: Abnormal   Collection Time: 05/02/20  6:35 AM   Specimen: BLOOD  Result Value Ref Range Status   Specimen Description   Final    BLOOD RIGHT ANTECUBITAL Performed at [place] [address], [place], [place] [address]    Special Requests   Final    BOTTLES DRAWN AEROBIC AND ANAEROBIC Blood Culture results may not be optimal due to an excessive volume of blood received in culture bottles Performed at [place] [address], [place], [place] [address]    Culture  Setup Time   Final    GRAM POSITIVE COCCI IN CLUSTERS IN BOTH AEROBIC AND ANAEROBIC BOTTLES CRITICAL RESULT CALLED TO, READ BACK BY AND VERIFIED WITH: Rockey Situ [facility] 05/02/20 2139 JDW Performed at [facility], [place] [address], [place], [place] [address]    Culture STAPHYLOCOCCUS AUREUS (A)  Final   Report Status 05/04/2020 FINAL  Final   Organism ID, Bacteria STAPHYLOCOCCUS AUREUS  Final      Susceptibility   Staphylococcus aureus - MIC*    CIPROFLOXACIN <=0.5 SENSITIVE Sensitive     ERYTHROMYCIN <=0.25 SENSITIVE Sensitive     GENTAMICIN <=0.5 SENSITIVE Sensitive      OXACILLIN 0.5 SENSITIVE Sensitive     TETRACYCLINE >=16 RESISTANT  Resistant     VANCOMYCIN <=0.5 SENSITIVE Sensitive     TRIMETH/SULFA <=10 SENSITIVE Sensitive     CLINDAMYCIN <=0.25 SENSITIVE Sensitive     RIFAMPIN <=0.5 SENSITIVE Sensitive     Inducible Clindamycin NEGATIVE Sensitive     * STAPHYLOCOCCUS AUREUS  Blood Culture (routine x 2)     Status: Abnormal   Collection Time: 05/02/20  6:35 AM   Specimen: BLOOD  Result Value Ref Range Status   Specimen Description   Final    BLOOD BLOOD LEFT HAND Performed at [facility] [address], [place], [place] [address]    Special Requests   Final    BOTTLES DRAWN AEROBIC AND ANAEROBIC Blood Culture adequate volume Performed at [place] [address] [place], [place] [address]    Culture  Setup Time   Final    GRAM POSITIVE COCCI IN CLUSTERS IN BOTH AEROBIC AND ANAEROBIC BOTTLES CRITICAL VALUE NOTED.  VALUE IS CONSISTENT WITH PREVIOUSLY REPORTED AND CALLED VALUE.    Culture (A)  Final    STAPHYLOCOCCUS AUREUS SUSCEPTIBILITIES PERFORMED ON PREVIOUS CULTURE WITHIN THE LAST 5 DAYS. Performed at [facility], [place] [address], [place], [place] [address]    Report Status 05/04/2020 FINAL  Final  Blood Culture ID Panel (Reflexed)     Status: Abnormal   Collection Time: 05/02/20  6:35 AM  Result Value Ref Range Status   Enterococcus faecalis NOT DETECTED NOT DETECTED Final   Enterococcus Faecium NOT DETECTED NOT DETECTED Final   Listeria monocytogenes NOT DETECTED NOT DETECTED Final   Staphylococcus species DETECTED (A) NOT DETECTED Final    Comment: CRITICAL RESULT CALLED TO, READ BACK BY AND VERIFIED WITH: Rockey Situ [facility] 05/02/20 2139 JDW    Staphylococcus aureus (BCID) DETECTED (A) NOT DETECTED Final    Comment: CRITICAL RESULT CALLED TO, READ BACK BY AND VERIFIED WITH: Rockey Situ [facility] 05/02/20 2139 JDW    Staphylococcus epidermidis NOT DETECTED NOT DETECTED Final   Staphylococcus  lugdunensis NOT DETECTED NOT DETECTED Final   Streptococcus species NOT DETECTED NOT DETECTED Final   Streptococcus agalactiae NOT DETECTED NOT DETECTED Final   Streptococcus pneumoniae NOT DETECTED NOT DETECTED Final   Streptococcus pyogenes NOT DETECTED NOT DETECTED Final   A.calcoaceticus-baumannii NOT DETECTED NOT DETECTED Final   Bacteroides fragilis NOT DETECTED NOT DETECTED Final   Enterobacterales NOT DETECTED NOT DETECTED Final   Enterobacter cloacae complex NOT DETECTED NOT DETECTED Final   Escherichia coli NOT DETECTED NOT DETECTED Final   Klebsiella aerogenes NOT DETECTED NOT DETECTED Final   Klebsiella oxytoca NOT DETECTED NOT DETECTED Final   Klebsiella pneumoniae NOT DETECTED NOT DETECTED Final   Proteus species NOT DETECTED NOT DETECTED Final   Salmonella species NOT DETECTED NOT DETECTED Final   Serratia marcescens NOT DETECTED NOT DETECTED Final   Haemophilus influenzae NOT DETECTED NOT DETECTED Final   Neisseria meningitidis NOT DETECTED NOT DETECTED Final   Pseudomonas aeruginosa NOT DETECTED NOT DETECTED Final   Stenotrophomonas maltophilia NOT DETECTED NOT DETECTED Final   Candida albicans NOT DETECTED NOT DETECTED Final   Candida auris NOT DETECTED NOT DETECTED Final   Candida glabrata NOT DETECTED NOT DETECTED Final   Candida krusei NOT DETECTED NOT DETECTED Final   Candida parapsilosis NOT DETECTED NOT DETECTED Final   Candida tropicalis NOT DETECTED NOT DETECTED Final   Cryptococcus neoformans/gattii NOT DETECTED NOT DETECTED Final   Meth resistant mecA/C and MREJ NOT DETECTED NOT DETECTED Final  Comment: Performed at [facility], [place] [address], [place], [place] [address]  Urine culture     Status: None   Collection Time: 05/02/20  7:52 AM   Specimen: In/Out Cath Urine  Result Value Ref Range Status   Specimen Description   Final    IN/OUT CATH URINE Performed at [place] [address] [place], [place] [address]     Special Requests   Final    NONE Performed at [facility], [place] [address], [place], [place] [address]    Culture   Final    NO GROWTH Performed at [facility], [place] [address], [place], [place] [address]    Report Status 05/03/2020 FINAL  Final  MRSA PCR Screening     Status: None   Collection Time: 05/02/20  6:32 PM   Specimen: Nasopharyngeal  Result Value Ref Range Status   MRSA by PCR NEGATIVE NEGATIVE Final    Comment:        The GeneXpert MRSA Assay (FDA approved for NASAL specimens only), is one component of a comprehensive MRSA colonization surveillance program. It is not intended to diagnose MRSA infection nor to guide or monitor treatment for MRSA infections. Performed at [facility], [place] [address], [place], [place] [address]   Culture, blood (routine x 2)     Status: None (Preliminary result)   Collection Time: 05/04/20  4:54 AM   Specimen: BLOOD RIGHT ARM  Result Value Ref Range Status   Specimen Description BLOOD RIGHT ARM  Final   Special Requests   Final    AEROBIC BOTTLE ONLY Blood Culture results may not be optimal due to an inadequate volume of blood received in culture bottles   Culture   Final    NO GROWTH 1 DAY Performed at [facility], [place] [address] [place], [place] [address]    Report Status PENDING  Incomplete  Culture, blood (routine x 2)     Status: None (Preliminary result)   Collection Time: 05/04/20  5:08 AM   Specimen: BLOOD LEFT ARM  Result Value Ref Range Status   Specimen Description BLOOD LEFT ARM  Final   Special Requests   Final    AEROBIC BOTTLE ONLY Blood Culture results may not be optimal due to an inadequate volume of blood received in culture bottles   Culture   Final    NO GROWTH 1 DAY Performed at [facility], [place] [address], [place], [place] [address]    Report Status PENDING  Incomplete     Time coordinating discharge: 51mins  SIGNED:   Kathie Dike, MD  [facility] 05/06/2020, 1:58 PM   If 7PM-7AM, please contact night-coverage [email or web address]

## 2020-05-06 NOTE — TOC Transition Note (Addendum)
Transition of Care (TOC) - CM/SW Discharge Note Marvetta Gibbons RN,BSN Transitions of Care Unit 4NP (non trauma) - RN Case Manager See Treatment Team for direct Phone #   Patient Details  Name: Martin Clark MRN: [id number] Date of Birth: March 20, 1945  Transition of Care [name]) CM/SW Contact:  [name], RN Phone Number: 05/06/2020, 12:58 PM   Clinical Narrative:    Pt pending transition home today with home IV abx, once PICC line has been placed. Orders for [facility] and OPAT have been placed.  CM spoke with pt and wife at the bedside to discuss home IV abx needs.  Choice offered for [facility] and infusion needs, per pt and wife they do not have a preference for [facility] or infusion agencies and will defer to this writer to secure on the patient's behalf. They are agreeable to using [facility] pharmacy for IV abx needs.  Address, phone #s and PCP all confirmed in epic.   Wife to transport home later today if PICC line is able to be placed.   Call made to [facility] with [place] for home IV abx needs, [name] will f/u with pt and wife for bedside education prior to discharge. Pt may need to get later dose here today pending on timing of PICC line placement. [name] will also work on coordinating [facility] for PICC line care and lab needs.  1440- notified by [name] that [name] has accepted referral for [facility] needs- with start of care on Wed. 4/6.      Final next level of care: Home w Home Health Services Barriers to Discharge: Other (comment) (PICC line placement)   Patient Goals and CMS Choice Patient states their goals for this hospitalization and ongoing recovery are:: return home and do things better CMS Medicare.gov Compare Post Acute Care list provided to:: Patient Choice offered to / list presented to : [facility]  Discharge Placement                 Home w/ [facility] and home IV abx      Discharge Plan and Services   Discharge Planning Services: CM Consult Post Acute Care  Choice: Home Health          DME Arranged: N/A DME Agency: NA       HH Arranged: RN,IV Antibiotics HH Agency: [facility] Date HH Agency Contacted: 05/06/20 Time [place]: 4098 Representative spoke with at [place]: [place] Determinants of Health ([place]) Interventions     Readmission Risk Interventions Readmission Risk Prevention Plan 05/06/2020  Post Dischage Appt Complete  Medication Screening Complete  Transportation Screening Complete  Some recent data might be hidden

## 2020-05-06 NOTE — Plan of Care (Signed)

## 2020-05-06 NOTE — Care Management Important Message (Signed)
Important Message  Patient Details  Name: Martin Clark MRN: [id number] Date of Birth: 02-Sep-1945   Medicare Important Message Given:  Yes     [name] [address] 05/06/2020, 2:11 PM

## 2020-05-06 NOTE — Progress Notes (Signed)
Patient discharged home, Home care arranged for IV antibiotics. Patient left floor walking accompanied by staff no c/o pain at d/c. Discharge instructions, RX's, and follow up appts explained and provided to patient and wife verbalized understanding.   Azul Brumett, Tivis Ringer, RN

## 2020-05-06 NOTE — Progress Notes (Signed)
[facility] for Infectious Disease   Reason for visit: Follow up on discitis  Interval History: he continues to feel well and no pain.  Wife at the bedside.  He is able to get up from his chair with no pain.  Day 7 total antibiotics Day 6 cefazolin Blood cultures 4/2 NGTD Blood cultures 3/31 4/4 bottles MSSA  Physical Exam: Constitutional:  Vitals:   05/06/20 0500 05/06/20 0740  BP: 139/75 122/66  Pulse: 68 70  Resp: 16 20  Temp: (!) 97.4 F (36.3 C) 99.2 F (37.3 C)  SpO2: 98% 98%  he is in NAD Respiratory: normal respiratory effort, CTA B Cardiovascular: RRR Neuro: non-focal  Review of Systems: Constitutional: negative for fever or chills Gastrointestinal: negative for n/v Integument/breast: negative for rash  Lab Results  Component Value Date   WBC 7.6 05/06/2020   HGB 9.9 (L) 05/06/2020   HCT 29.8 (L) 05/06/2020   MCV 93.1 05/06/2020   PLT 265 05/06/2020    Lab Results  Component Value Date   CREATININE 1.07 05/06/2020   BUN 20 05/06/2020   NA 137 05/06/2020   K 4.0 05/06/2020   CL 101 05/06/2020   CO2 28 05/06/2020    Lab Results  Component Value Date   ALT 34 05/06/2020   AST 41 05/06/2020   ALKPHOS 53 05/06/2020     Microbiology: Recent Results (from the past 240 hour(s))  Resp Panel by RT-PCR (Flu A&B, Covid) Nasopharyngeal Swab     Status: None   Collection Time: 05/02/20  6:35 AM   Specimen: Nasopharyngeal Swab; Nasopharyngeal(NP) swabs in vial transport medium  Result Value Ref Range Status   SARS Coronavirus 2 by RT PCR NEGATIVE NEGATIVE Final    Comment: (NOTE) SARS-CoV-2 target nucleic acids are NOT DETECTED.  The SARS-CoV-2 RNA is generally detectable in upper respiratory specimens during the acute phase of infection. The lowest concentration of SARS-CoV-2 viral copies this assay can detect is 138 copies/mL. A negative result does not preclude SARS-Cov-2 infection and should not be used as the sole basis for treatment  or other patient management decisions. A negative result may occur with  improper specimen collection/handling, submission of specimen other than nasopharyngeal swab, presence of viral mutation(s) within the areas targeted by this assay, and inadequate number of viral copies(<138 copies/mL). A negative result must be combined with clinical observations, patient history, and epidemiological information. The expected result is Negative.  Fact Sheet for Patients:  EntrepreneurPulse.com.au  Fact Sheet for Healthcare Providers:  IncredibleEmployment.be  This test is no t yet approved or cleared by the [place] FDA and  has been authorized for detection and/or diagnosis of SARS-CoV-2 by FDA under an Emergency Use Authorization (EUA). This EUA will remain  in effect (meaning this test can be used) for the duration of the COVID-19 declaration under Section 564(b)(1) of the Act, 21 U.S.C.section 360bbb-3(b)(1), unless the authorization is terminated  or revoked sooner.       Influenza A by PCR NEGATIVE NEGATIVE Final   Influenza B by PCR NEGATIVE NEGATIVE Final    Comment: (NOTE) The Xpert Xpress SARS-CoV-2/FLU/RSV plus assay is intended as an aid in the diagnosis of influenza from Nasopharyngeal swab specimens and should not be used as a sole basis for treatment. Nasal washings and aspirates are unacceptable for Xpert Xpress SARS-CoV-2/FLU/RSV testing.  Fact Sheet for Patients: EntrepreneurPulse.com.au  Fact Sheet for Healthcare Providers: IncredibleEmployment.be  This test is not yet approved or cleared by the [place]  FDA and has been authorized for detection and/or diagnosis of SARS-CoV-2 by FDA under an Emergency Use Authorization (EUA). This EUA will remain in effect (meaning this test can be used) for the duration of the COVID-19 declaration under Section 564(b)(1) of the Act, 21 U.S.C. section  360bbb-3(b)(1), unless the authorization is terminated or revoked.  Performed at [facility], [place] [address], [place], [place] [address]   Blood Culture (routine x 2)     Status: Abnormal   Collection Time: 05/02/20  6:35 AM   Specimen: BLOOD  Result Value Ref Range Status   Specimen Description   Final    BLOOD RIGHT ANTECUBITAL Performed at [place] [address], [place], [place] [address]    Special Requests   Final    BOTTLES DRAWN AEROBIC AND ANAEROBIC Blood Culture results may not be optimal due to an excessive volume of blood received in culture bottles Performed at [place] [address], [place], [place] [address]    Culture  Setup Time   Final    GRAM POSITIVE COCCI IN CLUSTERS IN BOTH AEROBIC AND ANAEROBIC BOTTLES CRITICAL RESULT CALLED TO, READ BACK BY AND VERIFIED WITH: Rockey Situ [facility] 05/02/20 2139 JDW Performed at [facility], [place] [address] [place], [place] [address]    Culture STAPHYLOCOCCUS AUREUS (A)  Final   Report Status 05/04/2020 FINAL  Final   Organism ID, Bacteria STAPHYLOCOCCUS AUREUS  Final      Susceptibility   Staphylococcus aureus - MIC*    CIPROFLOXACIN <=0.5 SENSITIVE Sensitive     ERYTHROMYCIN <=0.25 SENSITIVE Sensitive     GENTAMICIN <=0.5 SENSITIVE Sensitive     OXACILLIN 0.5 SENSITIVE Sensitive     TETRACYCLINE >=16 RESISTANT Resistant     VANCOMYCIN <=0.5 SENSITIVE Sensitive     TRIMETH/SULFA <=10 SENSITIVE Sensitive     CLINDAMYCIN <=0.25 SENSITIVE Sensitive     RIFAMPIN <=0.5 SENSITIVE Sensitive     Inducible Clindamycin NEGATIVE Sensitive     * STAPHYLOCOCCUS AUREUS  Blood Culture (routine x 2)     Status: Abnormal   Collection Time: 05/02/20  6:35 AM   Specimen: BLOOD  Result Value Ref Range Status   Specimen Description   Final    BLOOD BLOOD LEFT HAND Performed at [place] [address] [place], [place] [address]    Special  Requests   Final    BOTTLES DRAWN AEROBIC AND ANAEROBIC Blood Culture adequate volume Performed at [place] [address], [place], [place] [address]    Culture  Setup Time   Final    GRAM POSITIVE COCCI IN CLUSTERS IN BOTH AEROBIC AND ANAEROBIC BOTTLES CRITICAL VALUE NOTED.  VALUE IS CONSISTENT WITH PREVIOUSLY REPORTED AND CALLED VALUE.    Culture (A)  Final    STAPHYLOCOCCUS AUREUS SUSCEPTIBILITIES PERFORMED ON PREVIOUS CULTURE WITHIN THE LAST 5 DAYS. Performed at [facility], [place] [address] [place], [place] [address]    Report Status 05/04/2020 FINAL  Final  Blood Culture ID Panel (Reflexed)     Status: Abnormal   Collection Time: 05/02/20  6:35 AM  Result Value Ref Range Status   Enterococcus faecalis NOT DETECTED NOT DETECTED Final   Enterococcus Faecium NOT DETECTED NOT DETECTED Final   Listeria monocytogenes NOT DETECTED NOT DETECTED Final   Staphylococcus species DETECTED (A) NOT DETECTED Final    Comment: CRITICAL RESULT CALLED TO, READ BACK BY AND VERIFIED WITH: G  BARR [facility] 05/02/20 2139 JDW    Staphylococcus aureus (BCID) DETECTED (A) NOT DETECTED Final    Comment: CRITICAL RESULT CALLED TO, READ BACK BY AND VERIFIED WITH: Rockey Situ [facility] 05/02/20 2139 JDW    Staphylococcus epidermidis NOT DETECTED NOT DETECTED Final   Staphylococcus lugdunensis NOT DETECTED NOT DETECTED Final   Streptococcus species NOT DETECTED NOT DETECTED Final   Streptococcus agalactiae NOT DETECTED NOT DETECTED Final   Streptococcus pneumoniae NOT DETECTED NOT DETECTED Final   Streptococcus pyogenes NOT DETECTED NOT DETECTED Final   A.calcoaceticus-baumannii NOT DETECTED NOT DETECTED Final   Bacteroides fragilis NOT DETECTED NOT DETECTED Final   Enterobacterales NOT DETECTED NOT DETECTED Final   Enterobacter cloacae complex NOT DETECTED NOT DETECTED Final   Escherichia coli NOT DETECTED NOT DETECTED Final   Klebsiella aerogenes NOT DETECTED NOT DETECTED Final    Klebsiella oxytoca NOT DETECTED NOT DETECTED Final   Klebsiella pneumoniae NOT DETECTED NOT DETECTED Final   Proteus species NOT DETECTED NOT DETECTED Final   Salmonella species NOT DETECTED NOT DETECTED Final   Serratia marcescens NOT DETECTED NOT DETECTED Final   Haemophilus influenzae NOT DETECTED NOT DETECTED Final   Neisseria meningitidis NOT DETECTED NOT DETECTED Final   Pseudomonas aeruginosa NOT DETECTED NOT DETECTED Final   Stenotrophomonas maltophilia NOT DETECTED NOT DETECTED Final   Candida albicans NOT DETECTED NOT DETECTED Final   Candida auris NOT DETECTED NOT DETECTED Final   Candida glabrata NOT DETECTED NOT DETECTED Final   Candida krusei NOT DETECTED NOT DETECTED Final   Candida parapsilosis NOT DETECTED NOT DETECTED Final   Candida tropicalis NOT DETECTED NOT DETECTED Final   Cryptococcus neoformans/gattii NOT DETECTED NOT DETECTED Final   Meth resistant mecA/C and MREJ NOT DETECTED NOT DETECTED Final    Comment: Performed at [facility], [address] [place], [place] [address]  Urine culture     Status: None   Collection Time: 05/02/20  7:52 AM   Specimen: In/Out Cath Urine  Result Value Ref Range Status   Specimen Description   Final    IN/OUT CATH URINE Performed at [place] [address], [place], [place] [address]    Special Requests   Final    NONE Performed at [facility], [place] [address] [place], [place] [address]    Culture   Final    NO GROWTH Performed at [facility], [place] [address] [place], [place] [address]    Report Status 05/03/2020 FINAL  Final  MRSA PCR Screening     Status: None   Collection Time: 05/02/20  6:32 PM   Specimen: Nasopharyngeal  Result Value Ref Range Status   MRSA by PCR NEGATIVE NEGATIVE Final    Comment:        The GeneXpert MRSA Assay (FDA approved for NASAL specimens only), is one component of a comprehensive MRSA colonization surveillance program. It is  not intended to diagnose MRSA infection nor to guide or monitor treatment for MRSA infections. Performed at [facility], [place] [address], [place], [place] [address]   Culture, blood (routine x 2)     Status: None (Preliminary result)   Collection Time: 05/04/20  4:54 AM   Specimen: BLOOD RIGHT ARM  Result Value Ref Range Status   Specimen Description BLOOD RIGHT ARM  Final   Special Requests   Final    AEROBIC BOTTLE ONLY Blood Culture results may not be optimal due to an inadequate volume of  blood received in culture bottles   Culture   Final    NO GROWTH 1 DAY Performed at [facility], [place] [address], [place], [place] [address]    Report Status PENDING  Incomplete  Culture, blood (routine x 2)     Status: None (Preliminary result)   Collection Time: 05/04/20  5:08 AM   Specimen: BLOOD LEFT ARM  Result Value Ref Range Status   Specimen Description BLOOD LEFT ARM  Final   Special Requests   Final    AEROBIC BOTTLE ONLY Blood Culture results may not be optimal due to an inadequate volume of blood received in culture bottles   Culture   Final    NO GROWTH 1 DAY Performed at [facility], [place] [address] [place],  [address]    Report Status PENDING  Incomplete    Impression/Plan:  1. 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   2.  Access - picc line ordered.    3.  Disposition - OPAT ordered and ok from ID standpoint for discharge.    Diagnosis: MSSA cervical discitis  Culture Result: MSSA  No Known Allergies  OPAT Orders Discharge antibiotics to be given via PICC line Discharge antibiotics: cefazolin Per pharmacy protocol yes Duration: 6 weeks End Date: 06/14/20  [facility] Care Per Protocol:yes  Home health RN for IV administration and teaching; PICC line care and labs.    Labs weekly while on IV antibiotics: _x_ CBC with  differential __ BMP x__ CMP _x_ CRP _x_ ESR __ Vancomycin trough __ CK  __ Please pull PIC at completion of IV antibiotics _x_ Please leave PIC in place until doctor has seen patient or been notified  Fax weekly labs to [phone]  Clinic Follow Up Appt: 4/26  @ 2:45 pm with Dr. Linus Salmons

## 2020-05-08 DIAGNOSIS — M4804 Spinal stenosis, thoracic region: Secondary | ICD-10-CM | POA: Diagnosis not present

## 2020-05-08 DIAGNOSIS — D509 Iron deficiency anemia, unspecified: Secondary | ICD-10-CM | POA: Diagnosis not present

## 2020-05-08 DIAGNOSIS — I959 Hypotension, unspecified: Secondary | ICD-10-CM | POA: Diagnosis not present

## 2020-05-08 DIAGNOSIS — J9811 Atelectasis: Secondary | ICD-10-CM | POA: Diagnosis not present

## 2020-05-08 DIAGNOSIS — E785 Hyperlipidemia, unspecified: Secondary | ICD-10-CM | POA: Diagnosis not present

## 2020-05-08 DIAGNOSIS — M4312 Spondylolisthesis, cervical region: Secondary | ICD-10-CM | POA: Diagnosis not present

## 2020-05-08 DIAGNOSIS — M4805 Spinal stenosis, thoracolumbar region: Secondary | ICD-10-CM | POA: Diagnosis not present

## 2020-05-08 DIAGNOSIS — N179 Acute kidney failure, unspecified: Secondary | ICD-10-CM | POA: Diagnosis not present

## 2020-05-08 DIAGNOSIS — I4510 Unspecified right bundle-branch block: Secondary | ICD-10-CM | POA: Diagnosis not present

## 2020-05-08 DIAGNOSIS — M4643 Discitis, unspecified, cervicothoracic region: Secondary | ICD-10-CM | POA: Diagnosis not present

## 2020-05-08 DIAGNOSIS — G35 Multiple sclerosis: Secondary | ICD-10-CM | POA: Diagnosis not present

## 2020-05-08 DIAGNOSIS — E890 Postprocedural hypothyroidism: Secondary | ICD-10-CM | POA: Diagnosis not present

## 2020-05-08 DIAGNOSIS — I119 Hypertensive heart disease without heart failure: Secondary | ICD-10-CM | POA: Diagnosis not present

## 2020-05-08 DIAGNOSIS — R7881 Bacteremia: Secondary | ICD-10-CM | POA: Diagnosis not present

## 2020-05-08 DIAGNOSIS — I700 Atherosclerosis of aorta: Secondary | ICD-10-CM | POA: Diagnosis not present

## 2020-05-08 DIAGNOSIS — M47816 Spondylosis without myelopathy or radiculopathy, lumbar region: Secondary | ICD-10-CM | POA: Diagnosis not present

## 2020-05-08 DIAGNOSIS — G2581 Restless legs syndrome: Secondary | ICD-10-CM | POA: Diagnosis not present

## 2020-05-08 DIAGNOSIS — M48061 Spinal stenosis, lumbar region without neurogenic claudication: Secondary | ICD-10-CM | POA: Diagnosis not present

## 2020-05-08 DIAGNOSIS — Z452 Encounter for adjustment and management of vascular access device: Secondary | ICD-10-CM | POA: Diagnosis not present

## 2020-05-08 DIAGNOSIS — I088 Other rheumatic multiple valve diseases: Secondary | ICD-10-CM | POA: Diagnosis not present

## 2020-05-08 DIAGNOSIS — B9561 Methicillin susceptible Staphylococcus aureus infection as the cause of diseases classified elsewhere: Secondary | ICD-10-CM | POA: Diagnosis not present

## 2020-05-08 DIAGNOSIS — E876 Hypokalemia: Secondary | ICD-10-CM | POA: Diagnosis not present

## 2020-05-08 DIAGNOSIS — M47812 Spondylosis without myelopathy or radiculopathy, cervical region: Secondary | ICD-10-CM | POA: Diagnosis not present

## 2020-05-08 DIAGNOSIS — M4807 Spinal stenosis, lumbosacral region: Secondary | ICD-10-CM | POA: Diagnosis not present

## 2020-05-08 DIAGNOSIS — M4802 Spinal stenosis, cervical region: Secondary | ICD-10-CM | POA: Diagnosis not present

## 2020-05-09 LAB — CULTURE, BLOOD (ROUTINE X 2)
Culture: NO GROWTH
Culture: NO GROWTH

## 2020-05-13 ENCOUNTER — Encounter: Payer: Self-pay | Admitting: Neurology

## 2020-05-13 ENCOUNTER — Telehealth: Payer: Self-pay | Admitting: Neurology

## 2020-05-13 ENCOUNTER — Other Ambulatory Visit: Payer: Self-pay

## 2020-05-13 ENCOUNTER — Ambulatory Visit: Payer: PPO | Admitting: Neurology

## 2020-05-13 VITALS — BP 135/63 | HR 80 | Ht 70.5 in | Wt 226.6 lb

## 2020-05-13 DIAGNOSIS — G35 Multiple sclerosis: Secondary | ICD-10-CM

## 2020-05-13 DIAGNOSIS — M4640 Discitis, unspecified, site unspecified: Secondary | ICD-10-CM | POA: Diagnosis not present

## 2020-05-13 DIAGNOSIS — R7881 Bacteremia: Secondary | ICD-10-CM | POA: Diagnosis not present

## 2020-05-13 DIAGNOSIS — A4101 Sepsis due to Methicillin susceptible Staphylococcus aureus: Secondary | ICD-10-CM | POA: Diagnosis not present

## 2020-05-13 DIAGNOSIS — B9561 Methicillin susceptible Staphylococcus aureus infection as the cause of diseases classified elsewhere: Secondary | ICD-10-CM | POA: Diagnosis not present

## 2020-05-13 DIAGNOSIS — M4620 Osteomyelitis of vertebra, site unspecified: Secondary | ICD-10-CM | POA: Diagnosis not present

## 2020-05-13 DIAGNOSIS — I10 Essential (primary) hypertension: Secondary | ICD-10-CM | POA: Diagnosis not present

## 2020-05-13 NOTE — Patient Instructions (Signed)
We will start aubagio for the MS.

## 2020-05-13 NOTE — Progress Notes (Signed)
Reason for visit: Multiple sclerosis  Martin Clark is an 75 y.o. male  History of present illness:  Martin Clark is a 75 year old right-handed white male with a history of multiple sclerosis.  The patient had been on Copaxone, but the funding for his medication has not been present recently, Martin Clark can no longer afford to be on the medication.  The patient has not had an injection in about 2 months.  The patient has not had any new MS symptoms, no new vision changes, weakness, or numbness.  Martin Clark was just in the hospital with high fevers and confusion and Martin Clark was found to have discitis in the cervical area associated with staph sepsis, Martin Clark is currently on IV antibiotics.  We will continue these for another 5 weeks.  Martin Clark is doing quite well at this point, the pain in the back has gone away, his strength and balance is normal.  Martin Clark even claims that his restless leg syndrome symptoms have improved.  The patient is being considered for another MS treatment.  Past Medical History:  Diagnosis Date  . Dyslipidemia   . History of concussion   . Hypertension   . Hypothyroidism   . Memory disorder   . MS (multiple sclerosis) ([place])   . Obesity   . Restless legs syndrome   . Retinal detachment    right  . RLS (restless legs syndrome) 07/01/2016  . Thyroid cancer [facility])     Past Surgical History:  Procedure Laterality Date  . CATARACT EXTRACTION Right   . renal calculi    . Thyroid cancer resection    . TONSILLECTOMY    . VASECTOMY    . VASECTOMY      Family History  Problem Relation Age of Onset  . Heart failure Mother   . Heart failure Father     Social history:  reports that Martin Clark has never smoked. Martin Clark has never used smokeless tobacco. Martin Clark reports current alcohol use. Martin Clark reports that Martin Clark does not use drugs.   No Known Allergies  Medications:  Prior to Admission medications   Medication Sig Start Date End Date Taking? Authorizing Provider  aspirin EC 81 MG tablet Take 81 mg by mouth daily.   Yes  [provider]  ceFAZolin (ANCEF) IVPB Inject 2 g into the vein every 8 (eight) hours. Indication: MSSA bacteremia with discitis First Dose: Yes Last Day of Therapy:  06/14/20 Labs - Once weekly:  CBC/D and BMP, Labs - Every other week:  ESR and CRP Method of administration: IV Push Method of administration may be changed at the discretion of home infusion pharmacist based upon assessment of the patient and/or caregiver's ability to self-administer the medication ordered. 05/06/20 06/14/20 Yes Kathie Dike, MD  cholecalciferol (VITAMIN D3) 25 MCG (1000 UNIT) tablet Take 1,000 Units by mouth daily.   Yes [provider]  Cyanocobalamin (VITAMIN B 12 PO) Take 1 tablet by mouth daily.   Yes [provider]  levothyroxine (SYNTHROID, LEVOTHROID) 150 MCG tablet Take 150 mcg by mouth daily before breakfast.   Yes [provider]  lisinopril-hydrochlorothiazide (ZESTORETIC) 10-12.5 MG tablet Take 0.5 tablets by mouth daily.   Yes [provider]  OVER THE COUNTER MEDICATION Take 1 tablet by mouth daily at 6 (six) AM. flexuran take daily   Yes [provider]  pramipexole (MIRAPEX) 1 MG tablet Take 1 tablet (1 mg total) by mouth at bedtime. Patient taking differently: Take 1 mg by mouth daily. 07/01/16  Yes  Kathrynn Ducking, MD  rosuvastatin (CRESTOR) 10 MG tablet Take 10 mg by mouth daily. 06/30/19  Yes [provider]  traMADol (ULTRAM) 50 MG tablet Take 1 tablet (50 mg total) by mouth every 6 (six) hours as needed for moderate pain. 05/06/20  Yes Kathie Dike, MD  Glatiramer Acetate 40 MG/ML SOSY Inject 67ml ($RemoveBefo'40mg'SylEfTqZoMv$ ) subcutaneously three times a week. 08/16/19   Kathrynn Ducking, MD    ROS:  Out of a complete 14 system review of symptoms, the patient complains only of the following symptoms, and all other reviewed systems are negative.  Recent fevers, confusion, back pain  Height 5' 10.5" (1.791 m), weight 226 lb 9.6 oz (102.8  kg).  Physical Exam  General: The patient is alert and cooperative at the time of the examination.  Skin: No significant peripheral edema is noted.   Neurologic Exam  Mental status: The patient is alert and oriented x 3 at the time of the examination. The patient has apparent normal recent and remote memory, with an apparently normal attention span and concentration ability.   Cranial nerves: Facial symmetry is present. Speech is normal, no aphasia or dysarthria is noted. Extraocular movements are full. Visual fields are full.  Motor: The patient has good strength in all 4 extremities.  Sensory examination: Soft touch sensation is symmetric on the face, arms, and legs.  Coordination: The patient has good finger-nose-finger and heel-to-shin bilaterally.  Gait and station: The patient has a normal gait. Tandem gait is normal. Romberg is negative. No drift is seen.  Reflexes: Deep tendon reflexes are symmetric.   Assessment/Plan:  1.  Multiple sclerosis  2.  Recent discitis, staph sepsis  The patient will be considered for treatment with [name].  Martin Clark has signed a form, I will try to initiate therapy.  Martin Clark will follow-up here otherwise in 6 months.  Jill Alexanders MD 05/13/2020 2:47 PM  [facility] [address] [place], [place] [address]  Phone [phone] Fax [phone]

## 2020-05-14 ENCOUNTER — Encounter: Payer: Self-pay | Admitting: Internal Medicine

## 2020-05-14 DIAGNOSIS — B9561 Methicillin susceptible Staphylococcus aureus infection as the cause of diseases classified elsewhere: Secondary | ICD-10-CM | POA: Diagnosis not present

## 2020-05-15 ENCOUNTER — Telehealth: Payer: Self-pay | Admitting: Emergency Medicine

## 2020-05-15 ENCOUNTER — Telehealth: Payer: Self-pay

## 2020-05-15 DIAGNOSIS — Z79899 Other long term (current) drug therapy: Secondary | ICD-10-CM

## 2020-05-15 DIAGNOSIS — G35 Multiple sclerosis: Secondary | ICD-10-CM

## 2020-05-15 NOTE — Telephone Encounter (Signed)
No, just Benadryl as you said with topical steroid and see if it goes away.

## 2020-05-15 NOTE — Telephone Encounter (Signed)
Patient called to report rash that appeared around his ankles about 2 days ago. Originally attributed this to his compression socks and stopped wearing them; reports that today the rash is present on upper thighs. Reports no itching or discomfort; no respiratory distress noted. Patient is on IV Cefazolin Q8H. He's using hydrocortisone cream; not taking any oral medications. Recommended benadryl. Will forward to provider for additional recommendations if needed.   Jeniya Flannigan Lorita Officer, RN

## 2020-05-15 NOTE — Telephone Encounter (Signed)
[name] start form faxed   OK transmission received

## 2020-05-15 NOTE — Telephone Encounter (Signed)
Patient notified with recommendations and instructed to call back with any issues/concerns. No further complaints at this time.  Pavlos Yon Lorita Officer, RN

## 2020-05-16 NOTE — Telephone Encounter (Signed)
Received notification that patient is ineligible for patient assistance with [name] due to being on Medicare.  Dr. Jannifer Franklin notified and denial paperwork left in his office.

## 2020-05-20 ENCOUNTER — Encounter: Payer: Self-pay | Admitting: Internal Medicine

## 2020-05-20 DIAGNOSIS — M4640 Discitis, unspecified, site unspecified: Secondary | ICD-10-CM | POA: Diagnosis not present

## 2020-05-20 DIAGNOSIS — M4643 Discitis, unspecified, cervicothoracic region: Secondary | ICD-10-CM | POA: Diagnosis not present

## 2020-05-20 DIAGNOSIS — E876 Hypokalemia: Secondary | ICD-10-CM | POA: Diagnosis not present

## 2020-05-20 DIAGNOSIS — M4312 Spondylolisthesis, cervical region: Secondary | ICD-10-CM | POA: Diagnosis not present

## 2020-05-20 DIAGNOSIS — Z452 Encounter for adjustment and management of vascular access device: Secondary | ICD-10-CM | POA: Diagnosis not present

## 2020-05-20 DIAGNOSIS — R7881 Bacteremia: Secondary | ICD-10-CM | POA: Diagnosis not present

## 2020-05-20 DIAGNOSIS — I119 Hypertensive heart disease without heart failure: Secondary | ICD-10-CM | POA: Diagnosis not present

## 2020-05-20 DIAGNOSIS — M47812 Spondylosis without myelopathy or radiculopathy, cervical region: Secondary | ICD-10-CM | POA: Diagnosis not present

## 2020-05-20 DIAGNOSIS — B9561 Methicillin susceptible Staphylococcus aureus infection as the cause of diseases classified elsewhere: Secondary | ICD-10-CM | POA: Diagnosis not present

## 2020-05-20 DIAGNOSIS — I700 Atherosclerosis of aorta: Secondary | ICD-10-CM | POA: Diagnosis not present

## 2020-05-20 DIAGNOSIS — J9811 Atelectasis: Secondary | ICD-10-CM | POA: Diagnosis not present

## 2020-05-20 DIAGNOSIS — I4510 Unspecified right bundle-branch block: Secondary | ICD-10-CM | POA: Diagnosis not present

## 2020-05-20 DIAGNOSIS — I088 Other rheumatic multiple valve diseases: Secondary | ICD-10-CM | POA: Diagnosis not present

## 2020-05-20 DIAGNOSIS — G35 Multiple sclerosis: Secondary | ICD-10-CM | POA: Diagnosis not present

## 2020-05-20 DIAGNOSIS — N179 Acute kidney failure, unspecified: Secondary | ICD-10-CM | POA: Diagnosis not present

## 2020-05-20 DIAGNOSIS — D509 Iron deficiency anemia, unspecified: Secondary | ICD-10-CM | POA: Diagnosis not present

## 2020-05-20 DIAGNOSIS — M48061 Spinal stenosis, lumbar region without neurogenic claudication: Secondary | ICD-10-CM | POA: Diagnosis not present

## 2020-05-20 DIAGNOSIS — M4802 Spinal stenosis, cervical region: Secondary | ICD-10-CM | POA: Diagnosis not present

## 2020-05-20 DIAGNOSIS — E785 Hyperlipidemia, unspecified: Secondary | ICD-10-CM | POA: Diagnosis not present

## 2020-05-20 DIAGNOSIS — M4804 Spinal stenosis, thoracic region: Secondary | ICD-10-CM | POA: Diagnosis not present

## 2020-05-20 DIAGNOSIS — E890 Postprocedural hypothyroidism: Secondary | ICD-10-CM | POA: Diagnosis not present

## 2020-05-20 DIAGNOSIS — M4807 Spinal stenosis, lumbosacral region: Secondary | ICD-10-CM | POA: Diagnosis not present

## 2020-05-20 DIAGNOSIS — I959 Hypotension, unspecified: Secondary | ICD-10-CM | POA: Diagnosis not present

## 2020-05-20 DIAGNOSIS — G2581 Restless legs syndrome: Secondary | ICD-10-CM | POA: Diagnosis not present

## 2020-05-20 DIAGNOSIS — M4805 Spinal stenosis, thoracolumbar region: Secondary | ICD-10-CM | POA: Diagnosis not present

## 2020-05-20 DIAGNOSIS — M47816 Spondylosis without myelopathy or radiculopathy, lumbar region: Secondary | ICD-10-CM | POA: Diagnosis not present

## 2020-05-27 ENCOUNTER — Other Ambulatory Visit: Payer: Self-pay | Admitting: Neurology

## 2020-05-27 ENCOUNTER — Encounter: Payer: Self-pay | Admitting: Internal Medicine

## 2020-05-27 ENCOUNTER — Telehealth: Payer: Self-pay

## 2020-05-27 DIAGNOSIS — M4640 Discitis, unspecified, site unspecified: Secondary | ICD-10-CM | POA: Diagnosis not present

## 2020-05-27 DIAGNOSIS — R7881 Bacteremia: Secondary | ICD-10-CM | POA: Diagnosis not present

## 2020-05-27 DIAGNOSIS — Z452 Encounter for adjustment and management of vascular access device: Secondary | ICD-10-CM | POA: Diagnosis not present

## 2020-05-27 DIAGNOSIS — B9561 Methicillin susceptible Staphylococcus aureus infection as the cause of diseases classified elsewhere: Secondary | ICD-10-CM | POA: Diagnosis not present

## 2020-05-27 DIAGNOSIS — M4643 Discitis, unspecified, cervicothoracic region: Secondary | ICD-10-CM | POA: Diagnosis not present

## 2020-05-27 MED ORDER — AUBAGIO 14 MG PO TABS: 14.0000 mg | ORAL_TABLET | Freq: Every day | ORAL | 12 refills | Status: DC

## 2020-05-27 NOTE — Telephone Encounter (Signed)
Received call from [facility] home health nurse. She states she was at patient's house performing lab draws and dressing change today and that the patient's PICC insertion site is slightly red with some dried brown drainage present underneath the catheter.   She states there is no accumulation of drainage around the insertion site itself. She states the patient does not have any fever, chills, pain, itching, or swelling. Glenard Haring states she has notified [facility] as well.   Patient has follow-up with Dr. Linus Salmons tomorrow 05/28/20.   Beryle Flock, RN

## 2020-05-27 NOTE — Telephone Encounter (Signed)
Received call from [place] at [facility] wanting to notify regarding condition of PICC site redness and brown drainage. RN advised that home health nurse has made [place] aware, message has been sent to Dr. Linus Salmons, and patient has follow up with Dr. Linus Salmons tomorrow 05/28/20.  Beryle Flock, RN

## 2020-05-28 ENCOUNTER — Encounter: Payer: Self-pay | Admitting: Internal Medicine

## 2020-05-28 ENCOUNTER — Ambulatory Visit: Payer: PPO | Admitting: Internal Medicine

## 2020-05-28 ENCOUNTER — Other Ambulatory Visit: Payer: Self-pay

## 2020-05-28 VITALS — BP 122/74 | HR 89 | Temp 97.3°F | Resp 16 | Ht 70.5 in | Wt 220.0 lb

## 2020-05-28 DIAGNOSIS — R7881 Bacteremia: Secondary | ICD-10-CM

## 2020-05-28 DIAGNOSIS — Z452 Encounter for adjustment and management of vascular access device: Secondary | ICD-10-CM | POA: Diagnosis not present

## 2020-05-28 DIAGNOSIS — M4642 Discitis, unspecified, cervical region: Secondary | ICD-10-CM | POA: Diagnosis not present

## 2020-05-28 DIAGNOSIS — B9561 Methicillin susceptible Staphylococcus aureus infection as the cause of diseases classified elsewhere: Secondary | ICD-10-CM

## 2020-05-28 NOTE — Assessment & Plan Note (Signed)
There is a small area of erythema that looks just like some mild irritation.  No drainage.  No issues and plan to remove after the last dose on 06/14/20.

## 2020-05-28 NOTE — Progress Notes (Signed)
   Subjective:    Patient ID: Martin Clark, male    DOB: 1945-06-09, 75 y.o.   MRN: [id number]  HPI He is here for hsfu He developed new upper back pain and presented to the hospital with confusion, fever and MRI c/w cervical discitis/osteomyelitis.  MSSA grew in blood cultures and he was continued on IV cefazolin for 6 weeks through 06/14/20.  He is here for follow up and feels well.  No pain at all in his neck and improved movement.  He did have a fall recently while working in his yard but no injuries.  He developed a rash on his lower extremities and after 3 days of Benadryl it did resolve.  No complaints today.  ESR 50 and CRP 6 most recently and trending down.  Losing weight with improved diet.     Review of Systems  Constitutional: Negative for chills and fever.  Gastrointestinal: Negative for diarrhea and nausea.  Skin: Negative for rash.       Objective:   Physical Exam Constitutional:      Appearance: Normal appearance.  Cardiovascular:     Rate and Rhythm: Normal rate and regular rhythm.     Pulses: Normal pulses.  Neurological:     General: No focal deficit present.     Mental Status: He is alert.   SH: no tobacco        Assessment & Plan:

## 2020-05-28 NOTE — Assessment & Plan Note (Signed)
Last blood cultures from 4/2 remained negative and no significant issues.

## 2020-05-28 NOTE — Telephone Encounter (Signed)
Hope from [facility] left a voicemail this morning asking if we received the PA request for patient's [name].  I do not see this in his chart.  Patient will likely need a PA for [name], especially since patient assistance was ineligible since he is on Medicare.  I called [employer].  I asked them to fax me a forms directly to me.  I will complete these when they are received.

## 2020-05-28 NOTE — Assessment & Plan Note (Signed)
He continues to do remarkably well with no residual pain or concerns.  Inflammatory markers continue to decline.  I will have him follow up again in about 2 weeks to be sure he is doing well and then stop the antibiotics on 5/13.

## 2020-05-28 NOTE — Telephone Encounter (Signed)
Received fax from [employer] regarding PA for [name].  Completed and given to Dr. Jannifer Franklin for review.  Dr. Jannifer Franklin signed this PA.  I have faxed it back to [employer].  Received a receipt of confirmation.

## 2020-05-28 NOTE — Telephone Encounter (Signed)
I completed PA for [name] via CMM.  Sent to [employer].  Should have a determination within 72 hours. Key: [id number]

## 2020-06-03 ENCOUNTER — Encounter: Payer: Self-pay | Admitting: Internal Medicine

## 2020-06-03 DIAGNOSIS — I4510 Unspecified right bundle-branch block: Secondary | ICD-10-CM | POA: Diagnosis not present

## 2020-06-03 DIAGNOSIS — E785 Hyperlipidemia, unspecified: Secondary | ICD-10-CM | POA: Diagnosis not present

## 2020-06-03 DIAGNOSIS — I088 Other rheumatic multiple valve diseases: Secondary | ICD-10-CM | POA: Diagnosis not present

## 2020-06-03 DIAGNOSIS — M4804 Spinal stenosis, thoracic region: Secondary | ICD-10-CM | POA: Diagnosis not present

## 2020-06-03 DIAGNOSIS — M4312 Spondylolisthesis, cervical region: Secondary | ICD-10-CM | POA: Diagnosis not present

## 2020-06-03 DIAGNOSIS — D509 Iron deficiency anemia, unspecified: Secondary | ICD-10-CM | POA: Diagnosis not present

## 2020-06-03 DIAGNOSIS — G35 Multiple sclerosis: Secondary | ICD-10-CM | POA: Diagnosis not present

## 2020-06-03 DIAGNOSIS — E876 Hypokalemia: Secondary | ICD-10-CM | POA: Diagnosis not present

## 2020-06-03 DIAGNOSIS — I700 Atherosclerosis of aorta: Secondary | ICD-10-CM | POA: Diagnosis not present

## 2020-06-03 DIAGNOSIS — M4805 Spinal stenosis, thoracolumbar region: Secondary | ICD-10-CM | POA: Diagnosis not present

## 2020-06-03 DIAGNOSIS — E890 Postprocedural hypothyroidism: Secondary | ICD-10-CM | POA: Diagnosis not present

## 2020-06-03 DIAGNOSIS — M4643 Discitis, unspecified, cervicothoracic region: Secondary | ICD-10-CM | POA: Diagnosis not present

## 2020-06-03 DIAGNOSIS — M4802 Spinal stenosis, cervical region: Secondary | ICD-10-CM | POA: Diagnosis not present

## 2020-06-03 DIAGNOSIS — M4640 Discitis, unspecified, site unspecified: Secondary | ICD-10-CM | POA: Diagnosis not present

## 2020-06-03 DIAGNOSIS — M4807 Spinal stenosis, lumbosacral region: Secondary | ICD-10-CM | POA: Diagnosis not present

## 2020-06-03 DIAGNOSIS — B9561 Methicillin susceptible Staphylococcus aureus infection as the cause of diseases classified elsewhere: Secondary | ICD-10-CM | POA: Diagnosis not present

## 2020-06-03 DIAGNOSIS — M47816 Spondylosis without myelopathy or radiculopathy, lumbar region: Secondary | ICD-10-CM | POA: Diagnosis not present

## 2020-06-03 DIAGNOSIS — G2581 Restless legs syndrome: Secondary | ICD-10-CM | POA: Diagnosis not present

## 2020-06-03 DIAGNOSIS — I119 Hypertensive heart disease without heart failure: Secondary | ICD-10-CM | POA: Diagnosis not present

## 2020-06-03 DIAGNOSIS — Z452 Encounter for adjustment and management of vascular access device: Secondary | ICD-10-CM | POA: Diagnosis not present

## 2020-06-03 DIAGNOSIS — M47812 Spondylosis without myelopathy or radiculopathy, cervical region: Secondary | ICD-10-CM | POA: Diagnosis not present

## 2020-06-03 DIAGNOSIS — R7881 Bacteremia: Secondary | ICD-10-CM | POA: Diagnosis not present

## 2020-06-03 DIAGNOSIS — M48061 Spinal stenosis, lumbar region without neurogenic claudication: Secondary | ICD-10-CM | POA: Diagnosis not present

## 2020-06-03 DIAGNOSIS — I959 Hypotension, unspecified: Secondary | ICD-10-CM | POA: Diagnosis not present

## 2020-06-03 DIAGNOSIS — J9811 Atelectasis: Secondary | ICD-10-CM | POA: Diagnosis not present

## 2020-06-03 DIAGNOSIS — N179 Acute kidney failure, unspecified: Secondary | ICD-10-CM | POA: Diagnosis not present

## 2020-06-03 NOTE — Telephone Encounter (Signed)
PA for [name] was approved by [employer]: "PA Case: [id number], Status: Approved, Coverage Starts on: 05/30/2020 12:00:00 AM, Coverage Ends on: 02/01/2021 12:00:00 AM."

## 2020-06-03 NOTE — Telephone Encounter (Addendum)
I called [employer].  They have reached out to patient to schedule delivery of [name].  They will reach out again today.  Patient should call them at [phone].  I called patient.  I discussed this with him.  He will call [facility] back to schedule his [name] shipment.  I advised him of the once monthly LFTs for the first 6 months.  Once patient gets his shipment of [name] and is started the medication I will discuss this further with him.  Patient verbalized understanding.

## 2020-06-10 ENCOUNTER — Encounter: Payer: Self-pay | Admitting: Internal Medicine

## 2020-06-10 ENCOUNTER — Telehealth: Payer: Self-pay | Admitting: Neurology

## 2020-06-10 DIAGNOSIS — R7881 Bacteremia: Secondary | ICD-10-CM | POA: Diagnosis not present

## 2020-06-10 DIAGNOSIS — B9561 Methicillin susceptible Staphylococcus aureus infection as the cause of diseases classified elsewhere: Secondary | ICD-10-CM | POA: Diagnosis not present

## 2020-06-10 DIAGNOSIS — M4640 Discitis, unspecified, site unspecified: Secondary | ICD-10-CM | POA: Diagnosis not present

## 2020-06-10 DIAGNOSIS — M4643 Discitis, unspecified, cervicothoracic region: Secondary | ICD-10-CM | POA: Diagnosis not present

## 2020-06-10 NOTE — Telephone Encounter (Signed)
I called patient.  He received the financial assistance packet.  He has completed this and faxed it back to [facility] [employer].  I will continue to follow.

## 2020-06-10 NOTE — Telephone Encounter (Signed)
I called patient.  He should receive his [name] tomorrow.  I will check on him next week to make sure he is tolerating it well.  He understands that he will need once monthly lab work for the next 6 months.

## 2020-06-10 NOTE — Telephone Encounter (Signed)
Pt called wanting to inform provider and RN that he has received the funding for the [place] and he would like to proceed. Please advise.

## 2020-06-10 NOTE — Telephone Encounter (Signed)
There is already phone note regarding pt starting aubagio. Please refer to that phone note.

## 2020-06-10 NOTE — Telephone Encounter (Signed)
Received notice from [facility] [employer] that patient has been approved for patient assistance program 06/07/2020 through 02/01/2021.

## 2020-06-11 ENCOUNTER — Telehealth: Payer: Self-pay | Admitting: *Deleted

## 2020-06-11 ENCOUNTER — Encounter: Payer: Self-pay | Admitting: Internal Medicine

## 2020-06-11 ENCOUNTER — Ambulatory Visit: Payer: PPO | Admitting: Internal Medicine

## 2020-06-11 ENCOUNTER — Other Ambulatory Visit: Payer: Self-pay

## 2020-06-11 VITALS — BP 135/71 | HR 84 | Temp 97.9°F | Ht 70.0 in | Wt 226.0 lb

## 2020-06-11 DIAGNOSIS — Z452 Encounter for adjustment and management of vascular access device: Secondary | ICD-10-CM

## 2020-06-11 DIAGNOSIS — M4642 Discitis, unspecified, cervical region: Secondary | ICD-10-CM | POA: Diagnosis not present

## 2020-06-11 DIAGNOSIS — B9561 Methicillin susceptible Staphylococcus aureus infection as the cause of diseases classified elsewhere: Secondary | ICD-10-CM | POA: Diagnosis not present

## 2020-06-11 DIAGNOSIS — R7881 Bacteremia: Secondary | ICD-10-CM | POA: Diagnosis not present

## 2020-06-11 MED ORDER — CEPHALEXIN 500 MG PO CAPS: 500.0000 mg | ORAL_CAPSULE | Freq: Four times a day (QID) | ORAL | 0 refills | Status: DC

## 2020-06-11 NOTE — Assessment & Plan Note (Signed)
Clinically he continues to do well with no concerns.  No pain at all.  Inflammatory markers reassuring with an ESR of 15 and CRP 5 on 06/10/20.  I will transition him to oral Keflex for one month and consider stopping after that.  Will recheck ESR and CRP then (July).

## 2020-06-11 NOTE — Progress Notes (Signed)
   Subjective:    Patient ID: Martin Clark, male    DOB: Feb 02, 1946, 75 y.o.   MRN: [id number]  HPI He is here for hsfu He developed new upper back pain and presented to the hospital with confusion, fever and MRI c/w cervical discitis/osteomyelitis.  MSSA grew in blood cultures and he was continued on IV cefazolin for 6 weeks through 06/14/20. ESR 50 and CRP 6 most recently and trending down prior to the last visit.  Today he continues to feel relatively well, no new concerns.  More active.  No associated rash after a previous rash resolved after some Benadryl.  No associated diarrhea.     Review of Systems  Constitutional: Negative for chills and fever.  Gastrointestinal: Negative for diarrhea and nausea.  Skin: Negative for rash.       Objective:   Physical Exam Pulmonary:     Effort: Pulmonary effort is normal.  Skin:    Findings: No rash.  Neurological:     General: No focal deficit present.     Mental Status: He is alert.  Psychiatric:        Mood and Affect: Mood normal.   SH: no tobacco        Assessment & Plan:

## 2020-06-11 NOTE — Assessment & Plan Note (Signed)
Continues to remain with no fever or concerns for reocurrence of bacteremia.

## 2020-06-11 NOTE — Telephone Encounter (Signed)
Per Dr Linus Salmons, relayed verbal orders to [facility] at [place] for home health nurse to pull picc at tend of treatment. IV antibiotics end 06/14/20. Dr Linus Salmons sent in prescription for oral antibiotics to start after IV antibiotics end. Landis Gandy, RN

## 2020-06-11 NOTE — Assessment & Plan Note (Signed)
No new issues, just a little redness as before.  Will remove after the last dose on 5/13 by [facility].

## 2020-06-14 DIAGNOSIS — N179 Acute kidney failure, unspecified: Secondary | ICD-10-CM | POA: Diagnosis not present

## 2020-06-14 DIAGNOSIS — M47816 Spondylosis without myelopathy or radiculopathy, lumbar region: Secondary | ICD-10-CM | POA: Diagnosis not present

## 2020-06-14 DIAGNOSIS — M4807 Spinal stenosis, lumbosacral region: Secondary | ICD-10-CM | POA: Diagnosis not present

## 2020-06-14 DIAGNOSIS — E890 Postprocedural hypothyroidism: Secondary | ICD-10-CM | POA: Diagnosis not present

## 2020-06-14 DIAGNOSIS — G35 Multiple sclerosis: Secondary | ICD-10-CM | POA: Diagnosis not present

## 2020-06-14 DIAGNOSIS — M4802 Spinal stenosis, cervical region: Secondary | ICD-10-CM | POA: Diagnosis not present

## 2020-06-14 DIAGNOSIS — E876 Hypokalemia: Secondary | ICD-10-CM | POA: Diagnosis not present

## 2020-06-14 DIAGNOSIS — M48061 Spinal stenosis, lumbar region without neurogenic claudication: Secondary | ICD-10-CM | POA: Diagnosis not present

## 2020-06-14 DIAGNOSIS — D509 Iron deficiency anemia, unspecified: Secondary | ICD-10-CM | POA: Diagnosis not present

## 2020-06-14 DIAGNOSIS — M4643 Discitis, unspecified, cervicothoracic region: Secondary | ICD-10-CM | POA: Diagnosis not present

## 2020-06-14 DIAGNOSIS — B9561 Methicillin susceptible Staphylococcus aureus infection as the cause of diseases classified elsewhere: Secondary | ICD-10-CM | POA: Diagnosis not present

## 2020-06-14 DIAGNOSIS — I4510 Unspecified right bundle-branch block: Secondary | ICD-10-CM | POA: Diagnosis not present

## 2020-06-14 DIAGNOSIS — M4805 Spinal stenosis, thoracolumbar region: Secondary | ICD-10-CM | POA: Diagnosis not present

## 2020-06-14 DIAGNOSIS — G2581 Restless legs syndrome: Secondary | ICD-10-CM | POA: Diagnosis not present

## 2020-06-14 DIAGNOSIS — M4804 Spinal stenosis, thoracic region: Secondary | ICD-10-CM | POA: Diagnosis not present

## 2020-06-14 DIAGNOSIS — R7881 Bacteremia: Secondary | ICD-10-CM | POA: Diagnosis not present

## 2020-06-14 DIAGNOSIS — I700 Atherosclerosis of aorta: Secondary | ICD-10-CM | POA: Diagnosis not present

## 2020-06-14 DIAGNOSIS — I959 Hypotension, unspecified: Secondary | ICD-10-CM | POA: Diagnosis not present

## 2020-06-14 DIAGNOSIS — I088 Other rheumatic multiple valve diseases: Secondary | ICD-10-CM | POA: Diagnosis not present

## 2020-06-14 DIAGNOSIS — J9811 Atelectasis: Secondary | ICD-10-CM | POA: Diagnosis not present

## 2020-06-14 DIAGNOSIS — I119 Hypertensive heart disease without heart failure: Secondary | ICD-10-CM | POA: Diagnosis not present

## 2020-06-14 DIAGNOSIS — M4312 Spondylolisthesis, cervical region: Secondary | ICD-10-CM | POA: Diagnosis not present

## 2020-06-14 DIAGNOSIS — E785 Hyperlipidemia, unspecified: Secondary | ICD-10-CM | POA: Diagnosis not present

## 2020-06-14 DIAGNOSIS — M47812 Spondylosis without myelopathy or radiculopathy, cervical region: Secondary | ICD-10-CM | POA: Diagnosis not present

## 2020-06-14 DIAGNOSIS — Z452 Encounter for adjustment and management of vascular access device: Secondary | ICD-10-CM | POA: Diagnosis not present

## 2020-06-17 NOTE — Addendum Note (Signed)
Addended by: Lester Lacona A on: 06/17/2020 03:34 PM   Modules accepted: Orders

## 2020-06-17 NOTE — Telephone Encounter (Signed)
I called patient.  He started his [name] last week.  He is tolerating it well with no side effects.  I reminded him of the once monthly lab work for 6 months to check LFTs.  Patient is agreeable to this.  We scheduled the next 6 months worth of labs.  I will continue to follow.  Patient verbalized understanding.

## 2020-07-17 ENCOUNTER — Other Ambulatory Visit (INDEPENDENT_AMBULATORY_CARE_PROVIDER_SITE_OTHER): Payer: PPO

## 2020-07-17 DIAGNOSIS — G35 Multiple sclerosis: Secondary | ICD-10-CM

## 2020-07-17 DIAGNOSIS — Z0289 Encounter for other administrative examinations: Secondary | ICD-10-CM

## 2020-07-17 DIAGNOSIS — Z79899 Other long term (current) drug therapy: Secondary | ICD-10-CM | POA: Diagnosis not present

## 2020-07-18 LAB — HEPATIC FUNCTION PANEL
ALT: 15 IU/L (ref 0–44)
AST: 24 IU/L (ref 0–40)
Albumin: 4.4 g/dL (ref 3.7–4.7)
Alkaline Phosphatase: 67 IU/L (ref 44–121)
Bilirubin Total: 0.4 mg/dL (ref 0.0–1.2)
Bilirubin, Direct: 0.15 mg/dL (ref 0.00–0.40)
Total Protein: 6.7 g/dL (ref 6.0–8.5)

## 2020-08-06 ENCOUNTER — Ambulatory Visit: Payer: PPO | Admitting: Internal Medicine

## 2020-08-06 ENCOUNTER — Other Ambulatory Visit: Payer: Self-pay

## 2020-08-06 ENCOUNTER — Encounter: Payer: Self-pay | Admitting: Internal Medicine

## 2020-08-06 DIAGNOSIS — Z452 Encounter for adjustment and management of vascular access device: Secondary | ICD-10-CM

## 2020-08-06 DIAGNOSIS — M4642 Discitis, unspecified, cervical region: Secondary | ICD-10-CM

## 2020-08-06 NOTE — Progress Notes (Signed)
   Subjective:    Patient ID: Martin Clark, male    DOB: Sep 03, 1945, 75 y.o.   MRN: [id number]  HPI He is here for follow up of discitis/osteomyelitis of the cervical region. He presented to the hospital in April with confusion, fever and MRI c/w above.  Also with MSSA positive blood cultures.  He completed 6 weeks of IV cefazolin and then transitioned to oral Keflex.  Initial CRP 31.8 and ESR 120.  Inflammatory markers trended down.  He is now completely pain free, working and active with his usual activities and very pleased with his progress.  No rash, fever or chills.  No longer on antibiotics having completed 1 month of Keflex.      Review of Systems  Constitutional:  Negative for fatigue and unexpected weight change.  Gastrointestinal:  Negative for diarrhea and nausea.  Skin:  Negative for rash.      Objective:   Physical Exam Eyes:     General: No scleral icterus. Pulmonary:     Effort: Pulmonary effort is normal.  Neurological:     General: No focal deficit present.     Mental Status: He is alert.  Psychiatric:        Mood and Affect: Mood normal.          Assessment & Plan:

## 2020-08-06 NOTE — Assessment & Plan Note (Signed)
He is doing well now and off antibiotics now.  He is completely symptoms free and no indication for further antibiotics and no indication to recheck inflammatory markers with no pain.   rtc as needed.

## 2020-08-06 NOTE — Assessment & Plan Note (Signed)
No concerns at previous picc line site, no erythema.

## 2020-08-09 ENCOUNTER — Other Ambulatory Visit: Payer: Self-pay | Admitting: Neurology

## 2020-08-12 ENCOUNTER — Ambulatory Visit: Payer: PPO | Admitting: Neurology

## 2020-08-14 ENCOUNTER — Other Ambulatory Visit (INDEPENDENT_AMBULATORY_CARE_PROVIDER_SITE_OTHER): Payer: Self-pay

## 2020-08-14 ENCOUNTER — Other Ambulatory Visit: Payer: Self-pay

## 2020-08-14 DIAGNOSIS — Z79899 Other long term (current) drug therapy: Secondary | ICD-10-CM

## 2020-08-14 DIAGNOSIS — G35 Multiple sclerosis: Secondary | ICD-10-CM | POA: Diagnosis not present

## 2020-08-14 DIAGNOSIS — Z0289 Encounter for other administrative examinations: Secondary | ICD-10-CM

## 2020-08-15 LAB — HEPATIC FUNCTION PANEL
ALT: 16 IU/L (ref 0–44)
AST: 22 IU/L (ref 0–40)
Albumin: 4.4 g/dL (ref 3.7–4.7)
Alkaline Phosphatase: 75 IU/L (ref 44–121)
Bilirubin Total: 0.5 mg/dL (ref 0.0–1.2)
Bilirubin, Direct: 0.15 mg/dL (ref 0.00–0.40)
Total Protein: 6.8 g/dL (ref 6.0–8.5)

## 2020-09-11 ENCOUNTER — Telehealth: Payer: Self-pay | Admitting: Neurology

## 2020-09-11 DIAGNOSIS — Z5181 Encounter for therapeutic drug level monitoring: Secondary | ICD-10-CM

## 2020-09-11 NOTE — Telephone Encounter (Signed)
I called the patient.  He will come in next week to get his blood work done for [employer].

## 2020-09-16 ENCOUNTER — Encounter (INDEPENDENT_AMBULATORY_CARE_PROVIDER_SITE_OTHER): Payer: Self-pay | Admitting: Ophthalmology

## 2020-09-16 ENCOUNTER — Ambulatory Visit (INDEPENDENT_AMBULATORY_CARE_PROVIDER_SITE_OTHER): Payer: PPO | Admitting: Ophthalmology

## 2020-09-16 ENCOUNTER — Other Ambulatory Visit: Payer: Self-pay

## 2020-09-16 DIAGNOSIS — H33051 Total retinal detachment, right eye: Secondary | ICD-10-CM

## 2020-09-16 DIAGNOSIS — H43813 Vitreous degeneration, bilateral: Secondary | ICD-10-CM | POA: Diagnosis not present

## 2020-09-16 DIAGNOSIS — H35371 Puckering of macula, right eye: Secondary | ICD-10-CM | POA: Diagnosis not present

## 2020-09-16 NOTE — Progress Notes (Signed)
09/16/2020     CHIEF COMPLAINT Patient presents for Retina Follow Up   HISTORY OF PRESENT ILLNESS: Martin Clark is a 75 y.o. male who presents to the clinic today for:   HPI     Retina Follow Up           Diagnosis: Other   Laterality: both eyes   Onset: 1 year ago   Severity: mild   Duration: 1 year   Course: stable         Comments   1 yr fu ou oct fundus Patient states vision is stable and unchanged since last visit. Denies any new floaters or FOL. Stable floaters.        Last edited by Laurin Coder, COA on 09/16/2020  1:36 PM.      Referring physician: Clent Jacks, MD [address],  [place] [address]  HISTORICAL INFORMATION:   Selected notes from the MEDICAL RECORD NUMBER       CURRENT MEDICATIONS: No current outpatient medications on file. (Ophthalmic Drugs)   No current facility-administered medications for this visit. (Ophthalmic Drugs)   Current Outpatient Medications (Other)  Medication Sig   aspirin EC 81 MG tablet Take 81 mg by mouth daily.   cephALEXin (KEFLEX) 500 MG capsule Take 1 capsule (500 mg total) by mouth 4 (four) times daily. (Patient not taking: Reported on 08/06/2020)   cholecalciferol (VITAMIN D3) 25 MCG (1000 UNIT) tablet Take 1,000 Units by mouth daily.   Cyanocobalamin (VITAMIN B12) 1000 MCG TBCR Take by mouth.   levothyroxine (SYNTHROID, LEVOTHROID) 150 MCG tablet Take 150 mcg by mouth daily before breakfast.   lisinopril-hydrochlorothiazide (ZESTORETIC) 20-25 MG tablet Take 1 tablet by mouth daily.   OVER THE COUNTER MEDICATION Take 1 tablet by mouth daily at 6 (six) AM. flexuran take daily   pramipexole (MIRAPEX) 1 MG tablet Take 1 tablet (1 mg total) by mouth at bedtime. (Patient taking differently: Take 1 mg by mouth daily.)   rosuvastatin (CRESTOR) 10 MG tablet Take 10 mg by mouth daily.   Teriflunomide ([name]) 14 MG TABS Take 14 mg by mouth.   No current facility-administered medications for this  visit. (Other)      REVIEW OF SYSTEMS:    ALLERGIES No Known Allergies  PAST MEDICAL HISTORY Past Medical History:  Diagnosis Date   Dyslipidemia    History of concussion    Hypertension    Hypothyroidism    Memory disorder    MS (multiple sclerosis) ([place])    Obesity    Restless legs syndrome    Retinal detachment    right   RLS (restless legs syndrome) 07/01/2016   Thyroid cancer ([place])    Past Surgical History:  Procedure Laterality Date   CATARACT EXTRACTION Right    renal calculi     Thyroid cancer resection     TONSILLECTOMY     VASECTOMY     VASECTOMY      FAMILY HISTORY Family History  Problem Relation Age of Onset   Heart failure Mother    Heart failure Father     SOCIAL HISTORY Social History   Tobacco Use   Smoking status: Never   Smokeless tobacco: Never  Substance Use Topics   Alcohol use: Yes    Alcohol/week: 0.0 standard drinks    Comment: monthly   Drug use: No         OPHTHALMIC EXAM:  Base Eye Exam     Visual Acuity (ETDRS)  Right Left   Dist cc 20/30 +2 20/20   Dist ph cc NI     Correction: Glasses         Tonometry (Tonopen, 1:34 PM)       Right Left   Pressure 8 8         Pupils       Pupils Dark Light Shape React APD   Right PERRL 3 3 Round Minimal None   Left PERRL 4 3 Round Brisk None         Visual Fields (Counting fingers)       Left Right    Full Full         Extraocular Movement       Right Left    Full Full  XT OD        Neuro/Psych     Oriented x3: Yes   Mood/Affect: Normal         Dilation     Both eyes: 1.0% Mydriacyl, 2.5% Phenylephrine @ 1:34 PM           Slit Lamp and Fundus Exam     External Exam       Right Left   External Normal Normal         Slit Lamp Exam       Right Left   Lids/Lashes Normal Normal   Conjunctiva/Sclera White and quiet White and quiet   Cornea Clear Clear   Anterior Chamber Deep and quiet Deep and quiet   Iris Round  and reactive Round and reactive   Lens Centered posterior chamber intraocular lens Centered posterior chamber intraocular lens   Anterior Vitreous Normal Normal         Fundus Exam       Right Left   Posterior Vitreous Posterior vitreous detachment Posterior vitreous detachment   Disc Normal Normal   C/D Ratio 0.5 0.5   Macula Normal Normal   Vessels Normal Normal   Periphery Scleral buckle, no retinal holes or breaks Pavingstone degeneration            IMAGING AND PROCEDURES  Imaging and Procedures for 09/16/20  OCT, Retina - OU - Both Eyes       Right Eye Quality was good. Scan locations included subfoveal. Central Foveal Thickness: 313. Progression has improved. Findings include normal foveal contour, retinal drusen , normal observations, epiretinal membrane.   Left Eye Quality was good. Scan locations included subfoveal. Central Foveal Thickness: 304. Progression has improved. Findings include normal observations, normal foveal contour, retinal drusen .      Color Fundus Photography Optos - OU - Both Eyes       Right Eye Progression has been stable. Disc findings include normal observations. Macula : normal observations, drusen. Vessels : normal observations. Periphery : normal observations.   Left Eye Progression has been stable. Disc findings include normal observations. Macula : normal observations. Vessels : normal observations. Periphery : normal observations.   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 Posterior vitreous detachment of both eyes  H43.813       1.  History of retinal detachment OD, stable.  2.  New retinal breaks OU observe  3.  Ophthalmic Meds Ordered this visit:  No orders of the defined types were placed in this encounter.      Return in about 2 years (around 09/17/2022) for DILATE OU, OCT, COLOR FP.  There are no Patient Instructions on file for this visit.   Explained the diagnoses, plan, and follow up with the patient and they expressed understanding.  Patient expressed understanding of the importance of proper follow up care.   Clent Demark Floye Fesler M.D. Diseases & Surgery of the Retina and Vitreous [facility] 09/16/20     Abbreviations: M myopia (nearsighted); A astigmatism; H hyperopia (farsighted); P presbyopia; Mrx spectacle prescription;  CTL contact lenses; OD right eye; OS left eye; OU both eyes  XT exotropia; ET esotropia; PEK punctate epithelial keratitis; PEE punctate epithelial erosions; DES dry eye syndrome; MGD meibomian gland dysfunction; ATs artificial tears; PFAT's preservative free artificial tears; [place] nuclear sclerotic cataract; PSC posterior subcapsular cataract; ERM epi-retinal membrane; PVD posterior vitreous detachment; RD retinal detachment; DM diabetes mellitus; DR diabetic retinopathy; NPDR non-proliferative diabetic retinopathy; PDR proliferative diabetic retinopathy; CSME clinically significant macular edema; DME diabetic macular edema; dbh dot blot hemorrhages; CWS cotton wool spot; POAG primary open angle glaucoma; C/D cup-to-disc ratio; HVF humphrey visual field; GVF goldmann visual field; OCT optical coherence tomography; IOP intraocular pressure; BRVO Branch retinal vein occlusion; CRVO central retinal vein occlusion; CRAO central retinal artery occlusion; BRAO branch retinal artery occlusion; RT retinal tear; SB scleral buckle; PPV pars  plana vitrectomy; VH Vitreous hemorrhage; PRP panretinal laser photocoagulation; IVK intravitreal kenalog; VMT vitreomacular traction; MH Macular hole;  NVD neovascularization of the disc; NVE neovascularization elsewhere; AREDS age related eye disease study; ARMD age related macular degeneration; POAG primary open angle glaucoma; EBMD epithelial/anterior basement membrane dystrophy; ACIOL anterior chamber intraocular lens; IOL intraocular lens; PCIOL posterior chamber intraocular lens; Phaco/IOL phacoemulsification with intraocular lens placement; [place] photorefractive keratectomy; LASIK laser assisted in situ keratomileusis; HTN hypertension; DM diabetes mellitus; COPD chronic obstructive pulmonary disease

## 2020-09-16 NOTE — Assessment & Plan Note (Signed)
History of scleral buckle OD, no new retinal breaks

## 2020-09-16 NOTE — Assessment & Plan Note (Signed)
No new retinal breaks 

## 2020-09-18 ENCOUNTER — Other Ambulatory Visit: Payer: Self-pay

## 2020-09-18 ENCOUNTER — Other Ambulatory Visit (INDEPENDENT_AMBULATORY_CARE_PROVIDER_SITE_OTHER): Payer: Self-pay

## 2020-09-18 DIAGNOSIS — Z5181 Encounter for therapeutic drug level monitoring: Secondary | ICD-10-CM

## 2020-09-18 DIAGNOSIS — G35 Multiple sclerosis: Secondary | ICD-10-CM | POA: Diagnosis not present

## 2020-09-18 DIAGNOSIS — Z0289 Encounter for other administrative examinations: Secondary | ICD-10-CM

## 2020-09-18 DIAGNOSIS — Z79899 Other long term (current) drug therapy: Secondary | ICD-10-CM | POA: Diagnosis not present

## 2020-09-19 ENCOUNTER — Telehealth: Payer: Self-pay | Admitting: Neurology

## 2020-09-19 DIAGNOSIS — Z5181 Encounter for therapeutic drug level monitoring: Secondary | ICD-10-CM

## 2020-09-19 LAB — COMPREHENSIVE METABOLIC PANEL
ALT: 18 IU/L (ref 0–44)
AST: 23 IU/L (ref 0–40)
Albumin/Globulin Ratio: 2 (ref 1.2–2.2)
Albumin: 4.6 g/dL (ref 3.7–4.7)
Alkaline Phosphatase: 70 IU/L (ref 44–121)
BUN/Creatinine Ratio: 23 (ref 10–24)
BUN: 35 mg/dL — ABNORMAL HIGH (ref 8–27)
Bilirubin Total: 0.5 mg/dL (ref 0.0–1.2)
CO2: 25 mmol/L (ref 20–29)
Calcium: 9.9 mg/dL (ref 8.6–10.2)
Chloride: 100 mmol/L (ref 96–106)
Creatinine, Ser: 1.5 mg/dL — ABNORMAL HIGH (ref 0.76–1.27)
Globulin, Total: 2.3 g/dL (ref 1.5–4.5)
Glucose: 121 mg/dL — ABNORMAL HIGH (ref 65–99)
Potassium: 3.9 mmol/L (ref 3.5–5.2)
Sodium: 140 mmol/L (ref 134–144)
Total Protein: 6.9 g/dL (ref 6.0–8.5)
eGFR: 49 mL/min/{1.73_m2} — ABNORMAL LOW (ref >59)

## 2020-09-19 LAB — HEPATIC FUNCTION PANEL: Bilirubin, Direct: 0.15 mg/dL (ref 0.00–0.40)

## 2020-09-19 NOTE — Telephone Encounter (Signed)
I called the patient.  The patient's had a change in renal function.  Although [name] can mainly affect the liver, renal failure has also been reported.  I will have the patient stop [name], he claimed that he is drinking plenty of fluids and will continue this.  He is on an blood pressure medication that includes a diuretic.  We will recheck the blood work in 2 weeks, if no improvement in renal function is noted, I will make a referral to a nephrologist.

## 2020-10-02 ENCOUNTER — Other Ambulatory Visit (INDEPENDENT_AMBULATORY_CARE_PROVIDER_SITE_OTHER): Payer: Self-pay

## 2020-10-02 DIAGNOSIS — Z5181 Encounter for therapeutic drug level monitoring: Secondary | ICD-10-CM

## 2020-10-02 DIAGNOSIS — Z79899 Other long term (current) drug therapy: Secondary | ICD-10-CM | POA: Diagnosis not present

## 2020-10-02 DIAGNOSIS — Z0289 Encounter for other administrative examinations: Secondary | ICD-10-CM

## 2020-10-02 DIAGNOSIS — G35 Multiple sclerosis: Secondary | ICD-10-CM | POA: Diagnosis not present

## 2020-10-03 ENCOUNTER — Telehealth: Payer: Self-pay | Admitting: Neurology

## 2020-10-03 DIAGNOSIS — Z5181 Encounter for therapeutic drug level monitoring: Secondary | ICD-10-CM

## 2020-10-03 LAB — COMPREHENSIVE METABOLIC PANEL
ALT: 17 IU/L (ref 0–44)
AST: 23 IU/L (ref 0–40)
Albumin/Globulin Ratio: 1.9 (ref 1.2–2.2)
Albumin: 4.3 g/dL (ref 3.7–4.7)
Alkaline Phosphatase: 64 IU/L (ref 44–121)
BUN/Creatinine Ratio: 27 — ABNORMAL HIGH (ref 10–24)
BUN: 34 mg/dL — ABNORMAL HIGH (ref 8–27)
Bilirubin Total: 0.5 mg/dL (ref 0.0–1.2)
CO2: 24 mmol/L (ref 20–29)
Calcium: 9.6 mg/dL (ref 8.6–10.2)
Chloride: 102 mmol/L (ref 96–106)
Creatinine, Ser: 1.25 mg/dL (ref 0.76–1.27)
Globulin, Total: 2.3 g/dL (ref 1.5–4.5)
Glucose: 133 mg/dL — ABNORMAL HIGH (ref 65–99)
Potassium: 3.3 mmol/L — ABNORMAL LOW (ref 3.5–5.2)
Sodium: 143 mmol/L (ref 134–144)
Total Protein: 6.6 g/dL (ref 6.0–8.5)
eGFR: 61 mL/min/{1.73_m2} (ref >59)

## 2020-10-03 LAB — HEPATIC FUNCTION PANEL: Bilirubin, Direct: 0.15 mg/dL (ref 0.00–0.40)

## 2020-10-03 NOTE — Telephone Encounter (Signed)
I called the patient.  The renal function is improved, GFR over 60 at this point.  I will restart the [name] and recheck blood work in 2 weeks.  If the GFR drops, he will stop the [name] permanently.  I will put orders in for blood work to be done in 2 weeks.  The patient is on lisinopril and a diuretic, he needs to be careful about drinking plenty of fluids.

## 2020-10-15 ENCOUNTER — Telehealth: Payer: Self-pay | Admitting: Neurology

## 2020-10-15 DIAGNOSIS — Z5181 Encounter for therapeutic drug level monitoring: Secondary | ICD-10-CM

## 2020-10-15 NOTE — Telephone Encounter (Signed)
I called the patient.  He will come in tomorrow for blood work to check renal function on [name].  If the renal function worsens, will have to stop [name] completely.

## 2020-10-16 ENCOUNTER — Other Ambulatory Visit (INDEPENDENT_AMBULATORY_CARE_PROVIDER_SITE_OTHER): Payer: Self-pay

## 2020-10-16 ENCOUNTER — Other Ambulatory Visit: Payer: Self-pay

## 2020-10-16 DIAGNOSIS — Z79899 Other long term (current) drug therapy: Secondary | ICD-10-CM | POA: Diagnosis not present

## 2020-10-16 DIAGNOSIS — G35 Multiple sclerosis: Secondary | ICD-10-CM | POA: Diagnosis not present

## 2020-10-16 DIAGNOSIS — Z0289 Encounter for other administrative examinations: Secondary | ICD-10-CM

## 2020-10-16 DIAGNOSIS — Z5181 Encounter for therapeutic drug level monitoring: Secondary | ICD-10-CM

## 2020-10-17 ENCOUNTER — Telehealth: Payer: Self-pay | Admitting: Neurology

## 2020-10-17 LAB — COMPREHENSIVE METABOLIC PANEL
ALT: 18 IU/L (ref 0–44)
AST: 23 IU/L (ref 0–40)
Albumin/Globulin Ratio: 2 (ref 1.2–2.2)
Albumin: 4.5 g/dL (ref 3.7–4.7)
Alkaline Phosphatase: 62 IU/L (ref 44–121)
BUN/Creatinine Ratio: 21 (ref 10–24)
BUN: 30 mg/dL — ABNORMAL HIGH (ref 8–27)
Bilirubin Total: 0.9 mg/dL (ref 0.0–1.2)
CO2: 27 mmol/L (ref 20–29)
Calcium: 9.7 mg/dL (ref 8.6–10.2)
Chloride: 102 mmol/L (ref 96–106)
Creatinine, Ser: 1.46 mg/dL — ABNORMAL HIGH (ref 0.76–1.27)
Globulin, Total: 2.3 g/dL (ref 1.5–4.5)
Glucose: 145 mg/dL — ABNORMAL HIGH (ref 65–99)
Potassium: 3.8 mmol/L (ref 3.5–5.2)
Sodium: 143 mmol/L (ref 134–144)
Total Protein: 6.8 g/dL (ref 6.0–8.5)
eGFR: 50 mL/min/{1.73_m2} — ABNORMAL LOW (ref >59)

## 2020-10-17 LAB — HEPATIC FUNCTION PANEL: Bilirubin, Direct: 0.23 mg/dL (ref 0.00–0.40)

## 2020-10-17 NOTE — Telephone Encounter (Signed)
I called the patient.  Back on [name] the renal function has declined again, he is to stop the medication, we will need to discuss another medical therapy for multiple sclerosis.

## 2020-10-17 NOTE — Telephone Encounter (Signed)
I called pt and we have scheduled appt for 10/22/2020 at 200 pm.

## 2020-10-22 ENCOUNTER — Telehealth: Payer: Self-pay | Admitting: Neurology

## 2020-10-22 ENCOUNTER — Encounter: Payer: Self-pay | Admitting: Neurology

## 2020-10-22 ENCOUNTER — Other Ambulatory Visit: Payer: Self-pay

## 2020-10-22 ENCOUNTER — Ambulatory Visit: Payer: PPO | Admitting: Neurology

## 2020-10-22 VITALS — BP 125/73 | HR 70 | Ht 70.0 in | Wt 220.0 lb

## 2020-10-22 DIAGNOSIS — G35 Multiple sclerosis: Secondary | ICD-10-CM | POA: Diagnosis not present

## 2020-10-22 NOTE — Progress Notes (Signed)
Reason for visit: Multiple sclerosis  Martin Clark is an 75 y.o. male  History of present illness:  Martin Clark is a 75 year old right-handed white male with a history of multiple sclerosis.  The patient has been on [name], but there has been some alteration in renal function, he has stopped the medication.  He had been on Copaxone previously but could not get the medication because he could not get funding.  He has not had a clinical exacerbation in greater than 10 years.  The last MRI of the brain was done in 2018.  This showed good stability from 2015.  The patient reports no new numbness, weakness, gait instability, or vision changes.  He returns to the office today for an evaluation.  Past Medical History:  Diagnosis Date   Dyslipidemia    History of concussion    Hypertension    Hypothyroidism    Memory disorder    MS (multiple sclerosis) ([place])    Obesity    Restless legs syndrome    Retinal detachment    right   RLS (restless legs syndrome) 07/01/2016   Thyroid cancer [facility])     Past Surgical History:  Procedure Laterality Date   CATARACT EXTRACTION Right    renal calculi     Thyroid cancer resection     TONSILLECTOMY     VASECTOMY     VASECTOMY      Family History  Problem Relation Age of Onset   Heart failure Mother    Heart failure Father     Social history:  reports that he has never smoked. He has never used smokeless tobacco. He reports current alcohol use. He reports that he does not use drugs.   No Known Allergies  Medications:  Prior to Admission medications   Medication Sig Start Date End Date Taking? Authorizing Provider  aspirin EC 81 MG tablet Take 81 mg by mouth daily.   Yes [provider]  cholecalciferol (VITAMIN D3) 25 MCG (1000 UNIT) tablet Take 1,000 Units by mouth daily.   Yes [provider]  Cyanocobalamin (VITAMIN B12) 1000 MCG TBCR Take by mouth.   Yes [provider]  levothyroxine (SYNTHROID, LEVOTHROID)  150 MCG tablet Take 150 mcg by mouth daily before breakfast.   Yes [provider]  lisinopril-hydrochlorothiazide (ZESTORETIC) 20-25 MG tablet Take 1 tablet by mouth daily. 05/17/20  Yes [provider]  OVER THE COUNTER MEDICATION Take 1 tablet by mouth daily at 6 (six) AM. flexuran take daily   Yes [provider]  pramipexole (MIRAPEX) 1 MG tablet Take 1 tablet (1 mg total) by mouth at bedtime. Patient taking differently: Take 1 mg by mouth daily. 07/01/16  Yes Kathrynn Ducking, MD  rosuvastatin (CRESTOR) 10 MG tablet Take 10 mg by mouth daily. 06/30/19  Yes [provider]    ROS:  Out of a complete 14 system review of symptoms, the patient complains only of the following symptoms, and all other reviewed systems are negative.  Heat sensitivity  Blood pressure 125/73, pulse 70, height 5\' 10"  (1.778 m), weight 220 lb (99.8 kg).  Physical Exam  General: The patient is alert and cooperative at the time of the examination.  Skin: No significant peripheral edema is noted.   Neurologic Exam  Mental status: The patient is alert and oriented x 3 at the time of the examination. The patient has apparent normal recent and remote memory, with an apparently normal attention span and concentration ability.  Cranial nerves: Facial symmetry is present. Speech is normal, no aphasia or dysarthria is noted. Extraocular movements are full, but on primary gaze there is exotropia of the right eye. Visual fields are full.  Motor: The patient has good strength in all 4 extremities.  Sensory examination: Soft touch sensation is symmetric on the face, arms, and legs.  Coordination: The patient has good finger-nose-finger and heel-to-shin bilaterally.  Gait and station: The patient has a normal gait. Tandem gait is normal. Romberg is negative. No drift is seen.  Reflexes: Deep tendon reflexes are symmetric.   MRI brain 01/19/17:  IMPRESSION:  Abnormal MRI brain  (with and without) demonstrating: 1. Mild round and ovoid periventricular, subcortical chronic demyelinating plaques. No abnormal lesions are seen on post contrast views.   2. No change from MRI on 06/24/13.    Assessment/Plan:  1.  Multiple sclerosis  The patient has demonstrated good clinical stability and stability on MRI of the brain.  We may consider following the patient without any disease modifying agents.  The patient will undergo MRI of the brain without contrast given the recent renal abnormalities.  He will have his blood checked in October to check the renal function.  The patient will follow up here in 6 months, in the future he can be followed through Dr. Felecia Shelling.  Jill Alexanders MD 10/22/2020 2:01 PM  [facility] [address] [place], [place] [address]  Phone [phone] Fax [phone]

## 2020-10-22 NOTE — Telephone Encounter (Signed)
health team order sent to GI. NPR they will reach out to the patient to schedule.

## 2020-10-23 DIAGNOSIS — H43392 Other vitreous opacities, left eye: Secondary | ICD-10-CM | POA: Diagnosis not present

## 2020-10-23 DIAGNOSIS — H43811 Vitreous degeneration, right eye: Secondary | ICD-10-CM | POA: Diagnosis not present

## 2020-10-23 DIAGNOSIS — H2512 Age-related nuclear cataract, left eye: Secondary | ICD-10-CM | POA: Diagnosis not present

## 2020-10-23 DIAGNOSIS — Z9889 Other specified postprocedural states: Secondary | ICD-10-CM | POA: Diagnosis not present

## 2020-10-23 DIAGNOSIS — H35412 Lattice degeneration of retina, left eye: Secondary | ICD-10-CM | POA: Diagnosis not present

## 2020-10-23 DIAGNOSIS — Z961 Presence of intraocular lens: Secondary | ICD-10-CM | POA: Diagnosis not present

## 2020-10-26 ENCOUNTER — Ambulatory Visit
Admission: RE | Admit: 2020-10-26 | Discharge: 2020-10-26 | Disposition: A | Discharge status: Home / Self Care | Payer: PPO | Source: Ambulatory Visit | Attending: Neurology | Admitting: Neurology

## 2020-10-26 ENCOUNTER — Other Ambulatory Visit: Payer: Self-pay

## 2020-10-26 DIAGNOSIS — G35 Multiple sclerosis: Secondary | ICD-10-CM

## 2020-10-28 ENCOUNTER — Telehealth: Payer: Self-pay | Admitting: Neurology

## 2020-10-28 NOTE — Telephone Encounter (Signed)
  I called the patient.  MRI of the brain is stable from 2018.  The patient will be off of disease modifying agents, we will follow the MRI of the brain annually for the next several years.  MRI brain 10/26/20:  IMPRESSION: This MRI of the brain without contrast shows the following: 1.   Scattered T2/FLAIR hyperintense foci in the hemispheres.  Foci in the periventricular and juxtacortical white matter have an appearance consistent with chronic demyelinating plaques associated with multiple sclerosis.  Other foci could be due to chronic demyelination or to chronic microvascular ischemic change.  None of the foci appear to be acute.  No changes compared to the 2018 MRI. 2.   Mild chronic sphenoid, right maxillary, ethmoid and frontal sinusitis. 3.   No acute findings.

## 2020-11-02 ENCOUNTER — Other Ambulatory Visit: Payer: PPO

## 2020-11-20 ENCOUNTER — Other Ambulatory Visit: Payer: Self-pay

## 2020-11-20 ENCOUNTER — Other Ambulatory Visit (INDEPENDENT_AMBULATORY_CARE_PROVIDER_SITE_OTHER): Payer: PPO

## 2020-11-20 DIAGNOSIS — Z79899 Other long term (current) drug therapy: Secondary | ICD-10-CM

## 2020-11-20 DIAGNOSIS — G35 Multiple sclerosis: Secondary | ICD-10-CM

## 2020-11-20 DIAGNOSIS — Z0289 Encounter for other administrative examinations: Secondary | ICD-10-CM

## 2020-11-21 LAB — HEPATIC FUNCTION PANEL
ALT: 17 IU/L (ref 0–44)
AST: 23 IU/L (ref 0–40)
Albumin: 4.3 g/dL (ref 3.7–4.7)
Alkaline Phosphatase: 63 IU/L (ref 44–121)
Bilirubin Total: 0.6 mg/dL (ref 0.0–1.2)
Bilirubin, Direct: 0.18 mg/dL (ref 0.00–0.40)
Total Protein: 6.6 g/dL (ref 6.0–8.5)

## 2020-11-28 ENCOUNTER — Ambulatory Visit: Payer: PPO | Admitting: Neurology

## 2020-12-16 DIAGNOSIS — R7301 Impaired fasting glucose: Secondary | ICD-10-CM | POA: Diagnosis not present

## 2020-12-16 DIAGNOSIS — Z0000 Encounter for general adult medical examination without abnormal findings: Secondary | ICD-10-CM | POA: Diagnosis not present

## 2020-12-16 DIAGNOSIS — Z125 Encounter for screening for malignant neoplasm of prostate: Secondary | ICD-10-CM | POA: Diagnosis not present

## 2020-12-16 DIAGNOSIS — E785 Hyperlipidemia, unspecified: Secondary | ICD-10-CM | POA: Diagnosis not present

## 2020-12-16 DIAGNOSIS — E039 Hypothyroidism, unspecified: Secondary | ICD-10-CM | POA: Diagnosis not present

## 2020-12-17 DIAGNOSIS — E7800 Pure hypercholesterolemia, unspecified: Secondary | ICD-10-CM | POA: Diagnosis not present

## 2020-12-17 DIAGNOSIS — G2581 Restless legs syndrome: Secondary | ICD-10-CM | POA: Diagnosis not present

## 2020-12-17 DIAGNOSIS — E039 Hypothyroidism, unspecified: Secondary | ICD-10-CM | POA: Diagnosis not present

## 2020-12-17 DIAGNOSIS — I700 Atherosclerosis of aorta: Secondary | ICD-10-CM | POA: Diagnosis not present

## 2020-12-17 DIAGNOSIS — E119 Type 2 diabetes mellitus without complications: Secondary | ICD-10-CM | POA: Diagnosis not present

## 2020-12-17 DIAGNOSIS — I10 Essential (primary) hypertension: Secondary | ICD-10-CM | POA: Diagnosis not present

## 2020-12-17 DIAGNOSIS — E559 Vitamin D deficiency, unspecified: Secondary | ICD-10-CM | POA: Diagnosis not present

## 2020-12-18 ENCOUNTER — Other Ambulatory Visit: Payer: PPO

## 2020-12-19 DIAGNOSIS — R7309 Other abnormal glucose: Secondary | ICD-10-CM | POA: Diagnosis not present

## 2020-12-19 DIAGNOSIS — E039 Hypothyroidism, unspecified: Secondary | ICD-10-CM | POA: Diagnosis not present

## 2020-12-19 DIAGNOSIS — I10 Essential (primary) hypertension: Secondary | ICD-10-CM | POA: Diagnosis not present

## 2020-12-19 DIAGNOSIS — Z0000 Encounter for general adult medical examination without abnormal findings: Secondary | ICD-10-CM | POA: Diagnosis not present

## 2020-12-19 DIAGNOSIS — G35 Multiple sclerosis: Secondary | ICD-10-CM | POA: Diagnosis not present

## 2020-12-19 DIAGNOSIS — E785 Hyperlipidemia, unspecified: Secondary | ICD-10-CM | POA: Diagnosis not present

## 2020-12-19 DIAGNOSIS — E559 Vitamin D deficiency, unspecified: Secondary | ICD-10-CM | POA: Diagnosis not present

## 2020-12-19 DIAGNOSIS — Z23 Encounter for immunization: Secondary | ICD-10-CM | POA: Diagnosis not present

## 2020-12-19 DIAGNOSIS — R972 Elevated prostate specific antigen [PSA]: Secondary | ICD-10-CM | POA: Diagnosis not present

## 2021-04-29 ENCOUNTER — Ambulatory Visit: Payer: PPO | Admitting: Neurology

## 2021-05-06 ENCOUNTER — Encounter: Payer: Self-pay | Admitting: Neurology

## 2021-05-06 ENCOUNTER — Ambulatory Visit: Payer: PPO | Admitting: Neurology

## 2021-05-06 VITALS — BP 122/64 | HR 73 | Ht 70.0 in | Wt 227.0 lb

## 2021-05-06 DIAGNOSIS — G35 Multiple sclerosis: Secondary | ICD-10-CM

## 2021-05-06 DIAGNOSIS — R269 Unspecified abnormalities of gait and mobility: Secondary | ICD-10-CM | POA: Insufficient documentation

## 2021-05-06 DIAGNOSIS — H33051 Total retinal detachment, right eye: Secondary | ICD-10-CM | POA: Diagnosis not present

## 2021-05-06 DIAGNOSIS — G2581 Restless legs syndrome: Secondary | ICD-10-CM | POA: Diagnosis not present

## 2021-05-06 NOTE — Progress Notes (Signed)
? ? ?[facility] ? ?PATIENT: Martin Clark ?DOB: 10/09/1945 ? ?REFERRING DOCTOR OR PCP: Thedora Hinders, FNP ?SOURCE: Patient, notes from Dr. Jannifer Franklin, imaging and lab reports, MRI images personally reviewed. ? ?_________________________________ ? ? ?HISTORICAL ? ?CHIEF COMPLAINT:  ?Chief Complaint  ?Patient presents with  ? Follow-up  ?  Pt alone, Rm 1, he is presenting as a new patient for Dr Felecia Shelling. Overall stable and he is doing well.   ? ? ?HISTORY OF PRESENT ILLNESS:  ?I had the pleasure of seeing your patient, Martin Clark, at the [place] [facility] for neurologic consultation regarding his multiple sclerosis ? ?He is a 76 year old man with MS diagnosed in 2011 after presenting with cognitive changes after heat exhaustion.   A couple weeks later he had more confusion and went to the ED.   MRI was concerning for MS and he had an LP showing 4 OCB.   He started Copaxone and then went to glatiramer.   He was going to switch to a pill but he and Dr. Jannifer Franklin decided to stop the DMT in 2021 and follow for breakthrough activity.      Follow-up MRI in 2022 showed no new activity compared to 2018.    ? ?Currently, he feels all his neurologic issues are stable.    He has had no exacerbations.   He can esily walk 2-3 miles without a rest and does well on uneven sufaces.     He goes up and down stairs easily.   No weakness or numbness.    No clumsiness.   Vision does well.    He had a retinal ear OD and has some exophoria.   Bladder is doing well. With 1 x nocturia ? ?He denies much fatigue and stays active.   He often sleeps only 5 hours but he never has daytime sleepiness and feels refreshed when he wakes up.  He actually feels more groggy if he gets 7 or more ours of sleep.  Pramipexole helps his RLS.    Mood is doing well.  Cognition is doing well. ? ?Vascular risks:  Never smoked.  No DM.  He has HTN and hyperlipidemia. ? ?He had sepsis and spinal infection.   He was reated with ABx  with resoution ? ?IMAGING (personally reviewed) ?MRI 10/26/2020 showed  Scattered T2/FLAIR hyperintense foci in the hemispheres.  Foci in the periventricular and juxtacortical white matter have an appearance consistent with chronic demyelinating plaques associated with multiple sclerosis.  Other foci could be due to chronic demyelination or to chronic microvascular ischemic change.  None of the foci appear to be acute.  No changes compared to the 2018 MRI. ? ?MRI cervical spine 05/02/2020 shows a normal spinal cord and multilevel DJD and foraminal narrowing.  .   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   (He was in the hospital at the time for sepsis) ? ? ?REVIEW OF SYSTEMS: ?Constitutional: No fevers, chills, sweats, or change in appetite ?Eyes: No visual changes, double vision, eye pain ?Ear, nose and throat: No hearing loss, ear pain, nasal congestion, sore throat ?Cardiovascular: No chest pain, palpitations ?Respiratory:  No shortness of breath at rest or with exertion.   No wheezes ?GastrointestinaI: No nausea, vomiting, diarrhea, abdominal pain, fecal incontinence ?Genitourinary:  No dysuria, urinary retention or frequency.  No nocturia. ?Musculoskeletal:  No neck pain, back pain ?Integumentary: No rash, pruritus, skin lesions ?Neurological: as above ?Psychiatric: No depression at this time.  No anxiety ?Endocrine: No palpitations, diaphoresis, change in appetite, change in weigh or increased thirst ?Hematologic/Lymphatic:  No anemia, purpura, petechiae. ?Allergic/Immunologic: No itchy/runny eyes, nasal congestion, recent allergic reactions, rashes ? ?ALLERGIES: ?No Known Allergies ? ?HOME MEDICATIONS: ? ?Current Outpatient Medications:  ?  aspirin EC 81 MG tablet, Take 81 mg by mouth daily., Disp: , Rfl:  ?  cholecalciferol (VITAMIN D3) 25 MCG (1000 UNIT) tablet, Take 1,000 Units by mouth daily., Disp: , Rfl:  ?  Cyanocobalamin (VITAMIN B12) 1000 MCG TBCR, Take by mouth., Disp: , Rfl:  ?   levothyroxine (SYNTHROID, LEVOTHROID) 150 MCG tablet, Take 150 mcg by mouth daily before breakfast., Disp: , Rfl:  ?  lisinopril-hydrochlorothiazide (ZESTORETIC) 20-25 MG tablet, Take 1 tablet by mouth daily., Disp: , Rfl:  ?  OVER THE COUNTER MEDICATION, Take 1 tablet by mouth daily at 6 (six) AM. flexuran take daily, Disp: , Rfl:  ?  pramipexole (MIRAPEX) 1 MG tablet, Take 1 tablet (1 mg total) by mouth at bedtime. (Patient taking differently: Take 1 mg by mouth daily.), Disp: 90 tablet, Rfl: 3 ?  rosuvastatin (CRESTOR) 20 MG tablet, Take 20 mg by mouth daily., Disp: , Rfl:  ? ?PAST MEDICAL HISTORY: ?Past Medical History:  ?Diagnosis Date  ? Dyslipidemia   ? History of concussion   ? Hypertension   ? Hypothyroidism   ? Memory disorder   ? MS (multiple sclerosis) ([place])   ? Obesity   ? Restless legs syndrome   ? Retinal detachment   ? right  ? RLS (restless legs syndrome) 07/01/2016  ? Thyroid cancer ([place])   ? ? ?PAST SURGICAL HISTORY: ?Past Surgical History:  ?Procedure Laterality Date  ? CATARACT EXTRACTION Right   ? renal calculi    ? Thyroid cancer resection    ? TONSILLECTOMY    ? VASECTOMY    ? VASECTOMY    ? ? ?FAMILY HISTORY: ?Family History  ?Problem Relation Age of Onset  ? Heart failure Mother   ? Heart failure Father   ? ? ?SOCIAL HISTORY: ? ?Social History  ? ?Socioeconomic History  ? Marital status: Married  ?  Spouse name: joAnn  ? Number of children: 2  ? Years of education: college  ? Highest education level: Not on file  ?Occupational History  ? Occupation: disabled   ?  Employer: [employer]  ?Tobacco Use  ? Smoking status: Never  ? Smokeless tobacco: Never  ?Substance and Sexual Activity  ? Alcohol use: Yes  ?  Alcohol/week: 0.0 standard drinks  ?  Comment: monthly  ? Drug use: No  ? Sexual activity: Yes  ?Other Topics Concern  ? Not on file  ?Social History Narrative  ? The patient is married and lives with his wife.  ? Patient drinks 5 cups of caffeine daily.  ? Patient is right handed.   ? ?Social Determinants of Health  ? ?Financial Resource Strain: Not on file  ?Food Insecurity: Not on file  ?Transportation Needs: Not on file  ?Physical Activity: Not on file  ?Stress: Not on file  ?Social Connections: Not on file  ?Intimate Partner Violence: Not on file  ? ? ? ?PHYSICAL EXAM ? ?Vitals:  ? 05/06/21 1443  ?BP: 122/64  ?Pulse: 73  ?Weight: 227 lb (103 kg)  ?Height: '5\' 10"'$  (1.778 m)  ? ? ?Body mass index is 32.57 kg/m?. ? ?General: The patient is well-developed and well-nourished and in no acute distress ? ?HEENT:  Head is /AT.  Sclera are anicteric.   ? ?Neck: No carotid bruits are noted.  The neck is nontender. ? ?Cardiovascular: The heart has a regular rate and rhythm with a normal S1 and S2. There were no murmurs, gallops or rubs.   ? ?Skin: Extremities are without rash or  edema. ? ?Neurologic Exam ? ?Mental status: The patient is alert and oriented x 3 at the time of the examination. The patient has apparent normal recent and remote memory, with an apparently normal attention span and concentration ability.   Speech is normal. ? ?Cranial nerves: Right exophoria.   Pupils are equal, round, and reactive to light and accomodation.  Visual fields are full.  Facial symmetry is present. There is good facial sensation to soft touch bilaterally.Facial strength is normal.  Trapezius and sternocleidomastoid strength is normal. No dysarthria is noted.   No obvious hearing deficits are noted. ? ?Motor:  Muscle bulk is normal.   Tone is normal. Strength is  5 / 5 in all 4 extremities.  ? ?Sensory: Sensory testing is intact to pinprick, soft touch and vibration sensation in all 4 extremities. ? ?Coordination: Cerebellar testing reveals good finger-nose-finger and heel-to-shin bilaterally. ? ?Gait and station: Station is normal.   Gait is slightly wide.  Tandem gait is mildly wide. Romberg is negative.  ? ?Reflexes: Deep tendon reflexes are symmetric and normal bilaterally.    ? ? ? ?DIAGNOSTIC DATA (LABS,  IMAGING, TESTING) ?- I reviewed patient records, labs, notes, testing and imaging myself where available. ? ?Lab Results  ?Component Value Date  ? WBC 7.6 05/06/2020  ? HGB 9.9 (L) 05/06/2020  ? HCT 29.8 (L)

## 2021-06-18 DIAGNOSIS — Z125 Encounter for screening for malignant neoplasm of prostate: Secondary | ICD-10-CM | POA: Diagnosis not present

## 2021-06-18 DIAGNOSIS — R972 Elevated prostate specific antigen [PSA]: Secondary | ICD-10-CM | POA: Diagnosis not present

## 2021-06-18 DIAGNOSIS — R7309 Other abnormal glucose: Secondary | ICD-10-CM | POA: Diagnosis not present

## 2021-06-18 DIAGNOSIS — E559 Vitamin D deficiency, unspecified: Secondary | ICD-10-CM | POA: Diagnosis not present

## 2021-06-18 DIAGNOSIS — E785 Hyperlipidemia, unspecified: Secondary | ICD-10-CM | POA: Diagnosis not present

## 2021-06-25 DIAGNOSIS — E039 Hypothyroidism, unspecified: Secondary | ICD-10-CM | POA: Diagnosis not present

## 2021-06-25 DIAGNOSIS — E785 Hyperlipidemia, unspecified: Secondary | ICD-10-CM | POA: Diagnosis not present

## 2021-06-25 DIAGNOSIS — R7309 Other abnormal glucose: Secondary | ICD-10-CM | POA: Diagnosis not present

## 2021-06-25 DIAGNOSIS — I10 Essential (primary) hypertension: Secondary | ICD-10-CM | POA: Diagnosis not present

## 2021-06-25 DIAGNOSIS — I700 Atherosclerosis of aorta: Secondary | ICD-10-CM | POA: Diagnosis not present

## 2021-06-25 DIAGNOSIS — G35 Multiple sclerosis: Secondary | ICD-10-CM | POA: Diagnosis not present

## 2021-07-01 ENCOUNTER — Other Ambulatory Visit: Payer: Self-pay | Admitting: Registered Nurse

## 2021-07-01 DIAGNOSIS — I700 Atherosclerosis of aorta: Secondary | ICD-10-CM

## 2021-08-27 ENCOUNTER — Ambulatory Visit
Admission: RE | Admit: 2021-08-27 | Discharge: 2021-08-27 | Disposition: A | Discharge status: Home / Self Care | Payer: PPO | Source: Ambulatory Visit | Attending: Registered Nurse | Admitting: Registered Nurse

## 2021-08-27 DIAGNOSIS — I700 Atherosclerosis of aorta: Secondary | ICD-10-CM | POA: Diagnosis not present

## 2021-09-17 ENCOUNTER — Ambulatory Visit: Payer: PPO | Admitting: Internal Medicine

## 2021-09-17 ENCOUNTER — Encounter: Payer: Self-pay | Admitting: Internal Medicine

## 2021-09-17 VITALS — BP 107/63 | HR 73 | Temp 98.0°F | Resp 17 | Ht 70.0 in | Wt 232.0 lb

## 2021-09-17 DIAGNOSIS — E785 Hyperlipidemia, unspecified: Secondary | ICD-10-CM | POA: Diagnosis not present

## 2021-09-17 DIAGNOSIS — R011 Cardiac murmur, unspecified: Secondary | ICD-10-CM | POA: Insufficient documentation

## 2021-09-17 DIAGNOSIS — R931 Abnormal findings on diagnostic imaging of heart and coronary circulation: Secondary | ICD-10-CM

## 2021-09-17 DIAGNOSIS — I10 Essential (primary) hypertension: Secondary | ICD-10-CM | POA: Diagnosis not present

## 2021-09-17 NOTE — Progress Notes (Signed)
Primary Physician/Referring:  Fatima Sanger, FNP  Patient ID: Martin Clark, male    DOB: [birth year], 76 y.o.   MRN: [id number]  Chief Complaint  Patient presents with   CAC   New Patient (Initial Visit)   Hypertension   HPI:    Martin Clark  is a 76 y.o. male with past medical history significant for hypertension, hyperlipidemia, and obesity who is here to establish care with cardiology.  He recently had a coronary artery calcium score which was significantly elevated.  Patient denies chest pain, shortness of breath, palpitations, diaphoresis, syncope.  He does admit that sometimes he has to stop and rest when he is doing activity.  Otherwise he is able to complete all of his activities of daily living without issue.  He is agreeable to echo and stress test, especially considering he had staph bacteremia 1 year ago which spread to his spine.  Patient is tolerating his medications well, will not make any changes.  Past Medical History:  Diagnosis Date   Dyslipidemia    History of concussion    Hypertension    Hypothyroidism    Memory disorder    MS (multiple sclerosis) (HCC)    Obesity    Restless legs syndrome    Retinal detachment    right   RLS (restless legs syndrome) 07/01/2016   Thyroid cancer (HCC)    Past Surgical History:  Procedure Laterality Date   CATARACT EXTRACTION Right    renal calculi     Thyroid cancer resection     TONSILLECTOMY     VASECTOMY     VASECTOMY     Family History  Problem Relation Age of Onset   Heart failure Mother    Heart failure Father     Social History   Tobacco Use   Smoking status: Never   Smokeless tobacco: Never  Substance Use Topics   Alcohol use: Yes    Alcohol/week: 0.0 standard drinks of alcohol    Comment: monthly   Marital Status: Married  ROS  Review of Systems  Cardiovascular:  Positive for dyspnea on exertion.  All other systems reviewed and are negative. Objective  Blood pressure 107/63, pulse 73,  temperature 98 F (36.7 C), resp. rate 17, height 5\' 10"  (1.778 m), weight 232 lb (105.2 kg), SpO2 97 %. Body mass index is 33.29 kg/m.     09/17/2021    1:47 PM 05/06/2021    2:43 PM 10/22/2020    1:57 PM  Vitals with BMI  Height 5\' 10"  5\' 10"  5\' 10"   Weight 232 lbs 227 lbs 220 lbs  BMI 33.29 32.57 31.57  Systolic 107 122 10/24/2020  Diastolic 63 64 73  Pulse 73 73 70     Physical Exam Vitals reviewed.  Constitutional:      Appearance: Normal appearance.  HENT:     Head: Normocephalic and atraumatic.  Neck:     Vascular: No carotid bruit.  Cardiovascular:     Rate and Rhythm: Normal rate and regular rhythm.     Pulses: Normal pulses.     Heart sounds: Murmur heard.  Pulmonary:     Effort: Pulmonary effort is normal.     Breath sounds: Normal breath sounds.  Abdominal:     General: Bowel sounds are normal.     Palpations: Abdomen is soft.  Musculoskeletal:     Right lower leg: No edema.     Left lower leg: No edema.  Skin:    General: Skin  is warm and dry.  Neurological:     Mental Status: He is alert.   Medications and allergies  No Known Allergies   Medication list after today's encounter   Current Outpatient Medications:    Ascorbic Acid (VITAMIN C) 1000 MG tablet, Take 1,000 mg by mouth daily., Disp: , Rfl:    aspirin EC 81 MG tablet, Take 81 mg by mouth daily., Disp: , Rfl:    cholecalciferol (VITAMIN D3) 25 MCG (1000 UNIT) tablet, Take 1,000 Units by mouth daily., Disp: , Rfl:    Cyanocobalamin (VITAMIN B12) 1000 MCG TBCR, Take by mouth., Disp: , Rfl:    levothyroxine (SYNTHROID, LEVOTHROID) 150 MCG tablet, Take 150 mcg by mouth daily before breakfast., Disp: , Rfl:    lisinopril-hydrochlorothiazide (ZESTORETIC) 20-25 MG tablet, Take 1 tablet by mouth daily., Disp: , Rfl:    NON FORMULARY, Take 10 mg by mouth daily at 6 (six) AM. Sleep 3, Disp: , Rfl:    OVER THE COUNTER MEDICATION, Take 1 tablet by mouth daily at 6 (six) AM. flexuran take daily, Disp: , Rfl:     pramipexole (MIRAPEX) 1 MG tablet, Take 1 tablet (1 mg total) by mouth at bedtime. (Patient taking differently: Take 1 mg by mouth daily.), Disp: 90 tablet, Rfl: 3   rosuvastatin (CRESTOR) 20 MG tablet, Take 20 mg by mouth daily., Disp: , Rfl:   Laboratory examination:   Lab Results  Component Value Date   NA 143 10/16/2020   K 3.8 10/16/2020   CO2 27 10/16/2020   GLUCOSE 145 (H) 10/16/2020   BUN 30 (H) 10/16/2020   CREATININE 1.46 (H) 10/16/2020   CALCIUM 9.7 10/16/2020   EGFR 50 (L) 10/16/2020   GFRNONAA >60 05/06/2020       Latest Ref Rng & Units 11/20/2020    2:07 PM 10/16/2020    2:14 PM 10/02/2020    2:52 PM  CMP  Glucose 65 - 99 mg/dL  145  133   BUN 8 - 27 mg/dL  30  34   Creatinine 0.76 - 1.27 mg/dL  1.46  1.25   Sodium 134 - 144 mmol/L  143  143   Potassium 3.5 - 5.2 mmol/L  3.8  3.3   Chloride 96 - 106 mmol/L  102  102   CO2 20 - 29 mmol/L  27  24   Calcium 8.6 - 10.2 mg/dL  9.7  9.6   Total Protein 6.0 - 8.5 g/dL 6.6  6.8  6.6   Total Bilirubin 0.0 - 1.2 mg/dL 0.6  0.9  0.5   Alkaline Phos 44 - 121 IU/L 63  62  64   AST 0 - 40 IU/L $Remov'23  23  23   'lnokLn$ ALT 0 - 44 IU/L $Remov'17  18  17       'SLnDHd$ Latest Ref Rng & Units 05/06/2020    3:05 AM 05/05/2020    3:32 AM 05/04/2020    4:54 AM  CBC  WBC 4.0 - 10.5 K/uL 7.6  7.5  9.0   Hemoglobin 13.0 - 17.0 g/dL 9.9  9.0  9.3   Hematocrit 39.0 - 52.0 % 29.8  26.9  28.2   Platelets 150 - 400 K/uL 265  208  211     Lipid Panel No results for input(s): "CHOL", "TRIG", "LDLCALC", "VLDL", "HDL", "CHOLHDL", "LDLDIRECT" in the last 8760 hours.  HEMOGLOBIN A1C No results found for: "HGBA1C", "MPG" TSH No results for input(s): "TSH" in the last 8760 hours.  External  labs:     Radiology:    Cardiac Studies:   No results found for this or any previous visit from the past 1095 days.     No results found for this or any previous visit from the past 1095 days.     EKG:   09/17/21 EKG: NSR    Assessment     ICD-10-CM   1.  Essential hypertension  I10 EKG 12-Lead    PCV ECHOCARDIOGRAM COMPLETE    PCV MYOCARDIAL PERFUSION WO LEXISCAN    2. Hyperlipidemia LDL goal <70  E78.5 PCV ECHOCARDIOGRAM COMPLETE    PCV MYOCARDIAL PERFUSION WO LEXISCAN    3. Agatston coronary artery calcium score greater than 400  R93.1 PCV ECHOCARDIOGRAM COMPLETE    PCV MYOCARDIAL PERFUSION WO LEXISCAN    4. Heart murmur  R01.1        Orders Placed This Encounter  Procedures   PCV MYOCARDIAL PERFUSION WO LEXISCAN    Standing Status:   Future    Standing Expiration Date:   11/17/2021   EKG 12-Lead   PCV ECHOCARDIOGRAM COMPLETE    Standing Status:   Future    Standing Expiration Date:   09/18/2022    No orders of the defined types were placed in this encounter.   Medications Discontinued During This Encounter  Medication Reason   lisinopril-hydrochlorothiazide (ZESTORETIC) 20-25 MG tablet      Recommendations:   Martin Clark is a 76 y.o. male with elevated coronary artery calcium score  Continue current cardiac medication Recommend low-sodium diet, prefer Dash diet Schedule imaging tests in office, echo and stress test Labs and medications reviewed with patient Follow-up in 6 months or sooner if needed    Floydene Flock, DO, [facility]  09/17/2021, 3:55 PM Office: [phone] Pager: [phone]

## 2021-10-01 ENCOUNTER — Ambulatory Visit: Payer: PPO

## 2021-10-01 DIAGNOSIS — I10 Essential (primary) hypertension: Secondary | ICD-10-CM | POA: Diagnosis not present

## 2021-10-01 DIAGNOSIS — R931 Abnormal findings on diagnostic imaging of heart and coronary circulation: Secondary | ICD-10-CM

## 2021-10-01 DIAGNOSIS — E785 Hyperlipidemia, unspecified: Secondary | ICD-10-CM | POA: Diagnosis not present

## 2021-10-01 DIAGNOSIS — R011 Cardiac murmur, unspecified: Secondary | ICD-10-CM | POA: Diagnosis not present

## 2021-10-10 ENCOUNTER — Ambulatory Visit: Payer: PPO

## 2021-10-10 DIAGNOSIS — R931 Abnormal findings on diagnostic imaging of heart and coronary circulation: Secondary | ICD-10-CM

## 2021-10-10 DIAGNOSIS — E785 Hyperlipidemia, unspecified: Secondary | ICD-10-CM | POA: Diagnosis not present

## 2021-10-10 DIAGNOSIS — I10 Essential (primary) hypertension: Secondary | ICD-10-CM | POA: Diagnosis not present

## 2021-10-13 NOTE — Progress Notes (Signed)
Can you please schedule this guy with me sooner, he will need cath so I'll have to discuss in person

## 2021-10-13 NOTE — Progress Notes (Signed)
Spoke with patient and advised him of above information. Patient is scheduled 10/23/21

## 2021-10-14 NOTE — Progress Notes (Signed)
This one

## 2021-10-23 ENCOUNTER — Telehealth: Payer: PPO | Admitting: Internal Medicine

## 2021-10-23 ENCOUNTER — Ambulatory Visit: Payer: PPO | Admitting: Internal Medicine

## 2021-10-23 DIAGNOSIS — I10 Essential (primary) hypertension: Secondary | ICD-10-CM

## 2021-10-23 DIAGNOSIS — R9439 Abnormal result of other cardiovascular function study: Secondary | ICD-10-CM | POA: Diagnosis not present

## 2021-10-23 DIAGNOSIS — E785 Hyperlipidemia, unspecified: Secondary | ICD-10-CM

## 2021-10-23 NOTE — Progress Notes (Signed)
Primary Physician/Referring:  Holland Commons, FNP  Patient ID: Martin Clark, male    DOB: [birth year], 76 y.o.   MRN: [id number]  No chief complaint on file.  HPI:    Volney Reierson  is a 76 y.o. male with past medical history significant for hypertension, hyperlipidemia, and obesity who is having a telephone visit due to illness in the family and he is not presently in the area.  His nuclear stress test was grossly positive for reversible ischemia. Patient will be scheduled for cardiac catheterization with possible intervention with Dr Virgina Jock.  Past Medical History:  Diagnosis Date   Dyslipidemia    History of concussion    Hypertension    Hypothyroidism    Memory disorder    MS (multiple sclerosis) ([place])    Obesity    Restless legs syndrome    Retinal detachment    right   RLS (restless legs syndrome) 07/01/2016   Thyroid cancer ([place])    Past Surgical History:  Procedure Laterality Date   CATARACT EXTRACTION Right    renal calculi     Thyroid cancer resection     TONSILLECTOMY     VASECTOMY     VASECTOMY     Family History  Problem Relation Age of Onset   Heart failure Mother    Heart failure Father     Social History   Tobacco Use   Smoking status: Never   Smokeless tobacco: Never  Substance Use Topics   Alcohol use: Yes    Alcohol/week: 0.0 standard drinks of alcohol    Comment: monthly   Marital Status: Married  ROS  Review of Systems  Cardiovascular:  Positive for dyspnea on exertion.  All other systems reviewed and are negative.  Objective  There were no vitals taken for this visit. There is no height or weight on file to calculate BMI.     09/17/2021    1:47 PM 05/06/2021    2:43 PM 10/22/2020    1:57 PM  Vitals with BMI  Height '5\' 10"'$  '5\' 10"'$  '5\' 10"'$   Weight 232 lbs 227 lbs 220 lbs  BMI 33.29 33.29 51.88  Systolic 416 606 301  Diastolic 63 64 73  Pulse 73 73 70      Medications and allergies  No Known Allergies   Medication list after  today's encounter   Current Outpatient Medications:    Ascorbic Acid (VITAMIN C) 1000 MG tablet, Take 1,000 mg by mouth daily., Disp: , Rfl:    aspirin EC 81 MG tablet, Take 81 mg by mouth daily., Disp: , Rfl:    cholecalciferol (VITAMIN D3) 25 MCG (1000 UNIT) tablet, Take 1,000 Units by mouth daily., Disp: , Rfl:    Cyanocobalamin (VITAMIN B12) 1000 MCG TBCR, Take by mouth., Disp: , Rfl:    levothyroxine (SYNTHROID, LEVOTHROID) 150 MCG tablet, Take 150 mcg by mouth daily before breakfast., Disp: , Rfl:    lisinopril-hydrochlorothiazide (ZESTORETIC) 20-25 MG tablet, Take 1 tablet by mouth daily., Disp: , Rfl:    NON FORMULARY, Take 10 mg by mouth daily at 6 (six) AM. Sleep 3, Disp: , Rfl:    OVER THE COUNTER MEDICATION, Take 1 tablet by mouth daily at 6 (six) AM. flexuran take daily, Disp: , Rfl:    pramipexole (MIRAPEX) 1 MG tablet, Take 1 tablet (1 mg total) by mouth at bedtime. (Patient taking differently: Take 1 mg by mouth daily.), Disp: 90 tablet, Rfl: 3   rosuvastatin (CRESTOR) 20 MG tablet, Take  20 mg by mouth daily., Disp: , Rfl:   Laboratory examination:   Lab Results  Component Value Date   NA 143 10/16/2020   K 3.8 10/16/2020   CO2 27 10/16/2020   GLUCOSE 145 (H) 10/16/2020   BUN 30 (H) 10/16/2020   CREATININE 1.46 (H) 10/16/2020   CALCIUM 9.7 10/16/2020   EGFR 50 (L) 10/16/2020   GFRNONAA >60 05/06/2020       Latest Ref Rng & Units 11/20/2020    2:07 PM 10/16/2020    2:14 PM 10/02/2020    2:52 PM  CMP  Glucose 65 - 99 mg/dL  145  133   BUN 8 - 27 mg/dL  30  34   Creatinine 0.76 - 1.27 mg/dL  1.46  1.25   Sodium 134 - 144 mmol/L  143  143   Potassium 3.5 - 5.2 mmol/L  3.8  3.3   Chloride 96 - 106 mmol/L  102  102   CO2 20 - 29 mmol/L  27  24   Calcium 8.6 - 10.2 mg/dL  9.7  9.6   Total Protein 6.0 - 8.5 g/dL 6.6  6.8  6.6   Total Bilirubin 0.0 - 1.2 mg/dL 0.6  0.9  0.5   Alkaline Phos 44 - 121 IU/L 63  62  64   AST 0 - 40 IU/L _0 ALT 0 - 44 IU/L _1 Latest Ref Rng & Units 05/06/2020    3:05 AM 05/05/2020    3:32 AM 05/04/2020    4:54 AM  CBC  WBC 4.0 - 10.5 K/uL 7.6  7.5  9.0   Hemoglobin 13.0 - 17.0 g/dL 9.9  9.0  9.3   Hematocrit 39.0 - 52.0 % 29.8  26.9  28.2   Platelets 150 - 400 K/uL 265  208  211     Lipid Panel No results for input(s): "CHOL", "TRIG", "LDLCALC", "VLDL", "HDL", "CHOLHDL", "LDLDIRECT" in the last 8760 hours.  HEMOGLOBIN A1C No results found for: "HGBA1C", "MPG" TSH No results for input(s): "TSH" in the last 8760 hours.  External labs:     Radiology:    Cardiac Studies:   CORONARY CALCIUM SCORES 08/28/21: Left Main: 118 LAD: 260 LCx: 96.7 RCA: 269   Total Agatston Score: 743 MESA database percentile: 74   Echocardiogram 10/10/2021:  Left ventricle cavity is normal in size. Moderate concentric remodeling of the left ventricle.  Normal global wall motion. Normal LV systolic function with visual EF 55-60%. Doppler evidence of grade I (impaired) diastolic dysfunction, normal LAP. Left atrial cavity is mildly dilated.  Trileaflet aortic valve with mild calcification. Mildly increased LVOT velocity. In addition, there is mild aortic valve stenosis. Vmax 2.6 m/sec, mean PG 16 mmHg, AVA 3 cm by continuity equation. No regurgitation. The aortic root is normal. Mildly dilated ascending aorta at 4 cm. No evidence of pulmonary hypertension.  Compared to previous study in 2016, mild aortic stenosis is new. Ascending aorta measured 3.9 then.     Exercise nuclear stress test 10/01/21 Myocardial perfusion is abnormal. There is a reversible defect in the septal and apical regions (LAD territory). Moderate risk study.  Overall LV systolic function is normal with regional wall motion abnormalities of the apical anterior wall, apical cap and apical septal wall. Stress LV EF: 85%.  Normal ECG stress. The patient exercised for 9 minutes and 6 seconds of a Bruce protocol, achieving approximately  10.31 METs  and 88% MPHR. The heart rate response was normal. The blood pressure response was normal. No previous exam available for comparison.    EKG:   09/17/21 EKG: NSR    Assessment     ICD-10-CM   1. Essential hypertension  I10     2. Hyperlipidemia LDL goal <70  E78.5     3. Positive cardiac stress test  R94.39        No orders of the defined types were placed in this encounter.   No orders of the defined types were placed in this encounter.   There are no discontinued medications.    Recommendations:   Martice Doty is a 76 y.o. male with elevated coronary artery calcium score and positive nuclear stress test  Continue current cardiac medication Recommend low-sodium diet, prefer Dash diet. Schedule for cardiac catheterization, and possible angioplasty. We discussed regarding risks, benefits, alternatives to this including stress testing, CTA and continued medical therapy. Patient wants to proceed. Understands <1-2% risk of death, stroke, MI, urgent CABG, bleeding, infection, renal failure but not limited to these. Patient instructed not to do heavy lifting, heavy exertional activity, swimming until evaluation is complete.  Patient instructed to call if symptoms worse or to go to the ED for further evaluation. Follow-up in 1 week after catheterization or sooner if needed.    Floydene Flock, DO, [facility]  10/23/2021, 9:38 AM Office: [phone] Pager: [phone]

## 2021-10-24 ENCOUNTER — Other Ambulatory Visit: Payer: Self-pay | Admitting: Internal Medicine

## 2021-10-24 ENCOUNTER — Other Ambulatory Visit: Payer: Self-pay

## 2021-10-24 DIAGNOSIS — I10 Essential (primary) hypertension: Secondary | ICD-10-CM

## 2021-10-25 LAB — CBC WITH DIFFERENTIAL/PLATELET
Basophils Absolute: 0 10*3/uL (ref 0.0–0.2)
Basos: 1 %
EOS (ABSOLUTE): 0.2 10*3/uL (ref 0.0–0.4)
Eos: 4 %
Hematocrit: 38 % (ref 37.5–51.0)
Hemoglobin: 12.8 g/dL — ABNORMAL LOW (ref 13.0–17.7)
Immature Grans (Abs): 0 10*3/uL (ref 0.0–0.1)
Immature Granulocytes: 0 %
Lymphocytes Absolute: 1.5 10*3/uL (ref 0.7–3.1)
Lymphs: 26 %
MCH: 30.9 pg (ref 26.6–33.0)
MCHC: 33.7 g/dL (ref 31.5–35.7)
MCV: 92 fL (ref 79–97)
Monocytes Absolute: 0.4 10*3/uL (ref 0.1–0.9)
Monocytes: 7 %
Neutrophils Absolute: 3.6 10*3/uL (ref 1.4–7.0)
Neutrophils: 62 %
Platelets: 183 10*3/uL (ref 150–450)
RBC: 4.14 x10E6/uL (ref 4.14–5.80)
RDW: 12.9 % (ref 11.6–15.4)
WBC: 5.8 10*3/uL (ref 3.4–10.8)

## 2021-10-25 LAB — BMP8+EGFR
BUN/Creatinine Ratio: 25 — ABNORMAL HIGH (ref 10–24)
BUN: 28 mg/dL — ABNORMAL HIGH (ref 8–27)
CO2: 26 mmol/L (ref 20–29)
Calcium: 9.3 mg/dL (ref 8.6–10.2)
Chloride: 100 mmol/L (ref 96–106)
Creatinine, Ser: 1.13 mg/dL (ref 0.76–1.27)
Glucose: 94 mg/dL (ref 70–99)
Potassium: 3.8 mmol/L (ref 3.5–5.2)
Sodium: 141 mmol/L (ref 134–144)
eGFR: 68 mL/min/{1.73_m2} (ref >59)

## 2021-10-28 ENCOUNTER — Other Ambulatory Visit: Payer: Self-pay

## 2021-10-28 ENCOUNTER — Ambulatory Visit (HOSPITAL_COMMUNITY)
Admission: RE | Admit: 2021-10-28 | Discharge: 2021-10-28 | Disposition: A | Discharge status: Home / Self Care | Payer: PPO | Attending: Cardiology | Admitting: Cardiology

## 2021-10-28 ENCOUNTER — Ambulatory Visit (HOSPITAL_COMMUNITY)
Admission: RE | Disposition: A | Discharge status: Home / Self Care | Payer: Self-pay | Source: Home / Self Care | Attending: Cardiology

## 2021-10-28 ENCOUNTER — Other Ambulatory Visit: Payer: Self-pay | Admitting: Cardiology

## 2021-10-28 DIAGNOSIS — R9439 Abnormal result of other cardiovascular function study: Secondary | ICD-10-CM

## 2021-10-28 DIAGNOSIS — I10 Essential (primary) hypertension: Secondary | ICD-10-CM | POA: Insufficient documentation

## 2021-10-28 DIAGNOSIS — E785 Hyperlipidemia, unspecified: Secondary | ICD-10-CM | POA: Diagnosis not present

## 2021-10-28 DIAGNOSIS — Z6832 Body mass index (BMI) 32.0-32.9, adult: Secondary | ICD-10-CM | POA: Insufficient documentation

## 2021-10-28 DIAGNOSIS — R0609 Other forms of dyspnea: Secondary | ICD-10-CM | POA: Diagnosis not present

## 2021-10-28 DIAGNOSIS — E669 Obesity, unspecified: Secondary | ICD-10-CM | POA: Insufficient documentation

## 2021-10-28 DIAGNOSIS — R931 Abnormal findings on diagnostic imaging of heart and coronary circulation: Secondary | ICD-10-CM

## 2021-10-28 HISTORY — PX: LEFT HEART CATH AND CORONARY ANGIOGRAPHY: CATH118249

## 2021-10-28 SURGERY — LEFT HEART CATH AND CORONARY ANGIOGRAPHY
Anesthesia: LOCAL

## 2021-10-28 MED ORDER — ACETAMINOPHEN 325 MG PO TABS: 650.0000 mg | ORAL_TABLET | ORAL | Status: DC | PRN

## 2021-10-28 MED ORDER — SODIUM CHLORIDE 0.9% FLUSH: 3.0000 mL | Freq: Two times a day (BID) | INTRAVENOUS | Status: DC

## 2021-10-28 MED ORDER — VERAPAMIL HCL 2.5 MG/ML IV SOLN
INTRAVENOUS | Status: DC | PRN
  Administered 2021-10-28: 10 mL via INTRA_ARTERIAL

## 2021-10-28 MED ORDER — ONDANSETRON HCL 4 MG/2ML IJ SOLN: 4.0000 mg | Freq: Four times a day (QID) | INTRAMUSCULAR | Status: DC | PRN

## 2021-10-28 MED ORDER — ASPIRIN 81 MG PO CHEW
81.0000 mg | CHEWABLE_TABLET | ORAL | Status: AC
  Administered 2021-10-28: 81 mg via ORAL

## 2021-10-28 MED ORDER — LIDOCAINE HCL (PF) 1 % IJ SOLN
INTRAMUSCULAR | Status: AC
  Filled 2021-10-28: qty 30

## 2021-10-28 MED ORDER — HEPARIN (PORCINE) IN NACL 1000-0.9 UT/500ML-% IV SOLN
INTRAVENOUS | Status: AC
  Filled 2021-10-28: qty 1000

## 2021-10-28 MED ORDER — HEPARIN SODIUM (PORCINE) 1000 UNIT/ML IJ SOLN
INTRAMUSCULAR | Status: AC
  Filled 2021-10-28: qty 10

## 2021-10-28 MED ORDER — SODIUM CHLORIDE 0.9 % WEIGHT BASED INFUSION: 1.0000 mL/kg/h | INTRAVENOUS | Status: DC

## 2021-10-28 MED ORDER — SODIUM CHLORIDE 0.9% FLUSH: 3.0000 mL | INTRAVENOUS | Status: DC | PRN

## 2021-10-28 MED ORDER — LIDOCAINE HCL (PF) 1 % IJ SOLN
INTRAMUSCULAR | Status: DC | PRN
  Administered 2021-10-28: 2 mL
  Administered 2021-10-28: 5 mL

## 2021-10-28 MED ORDER — HYDRALAZINE HCL 20 MG/ML IJ SOLN: 10.0000 mg | INTRAMUSCULAR | Status: DC | PRN

## 2021-10-28 MED ORDER — IOHEXOL 350 MG/ML SOLN
INTRAVENOUS | Status: DC | PRN
  Administered 2021-10-28: 50 mL

## 2021-10-28 MED ORDER — SODIUM CHLORIDE 0.9 % WEIGHT BASED INFUSION
3.0000 mL/kg/h | INTRAVENOUS | Status: AC
  Administered 2021-10-28: 3 mL/kg/h via INTRAVENOUS

## 2021-10-28 MED ORDER — FENTANYL CITRATE (PF) 100 MCG/2ML IJ SOLN
INTRAMUSCULAR | Status: DC | PRN
  Administered 2021-10-28: 25 ug via INTRAVENOUS

## 2021-10-28 MED ORDER — HEPARIN (PORCINE) IN NACL 1000-0.9 UT/500ML-% IV SOLN
INTRAVENOUS | Status: DC | PRN
  Administered 2021-10-28 (×2): 500 mL

## 2021-10-28 MED ORDER — NITROGLYCERIN 1 MG/10 ML FOR IR/CATH LAB
INTRA_ARTERIAL | Status: AC
  Filled 2021-10-28: qty 10

## 2021-10-28 MED ORDER — MIDAZOLAM HCL 2 MG/2ML IJ SOLN
INTRAMUSCULAR | Status: AC
  Filled 2021-10-28: qty 2

## 2021-10-28 MED ORDER — SODIUM CHLORIDE 0.9 % IV SOLN: 250.0000 mL | INTRAVENOUS | Status: DC | PRN

## 2021-10-28 MED ORDER — LABETALOL HCL 5 MG/ML IV SOLN: 10.0000 mg | INTRAVENOUS | Status: DC | PRN

## 2021-10-28 MED ORDER — MIDAZOLAM HCL 2 MG/2ML IJ SOLN
INTRAMUSCULAR | Status: DC | PRN
  Administered 2021-10-28: 1 mg via INTRAVENOUS

## 2021-10-28 MED ORDER — SODIUM CHLORIDE 0.9 % IV BOLUS
INTRAVENOUS | Status: DC | PRN
  Administered 2021-10-28: 250 mL via INTRAVENOUS

## 2021-10-28 MED ORDER — FENTANYL CITRATE (PF) 100 MCG/2ML IJ SOLN
INTRAMUSCULAR | Status: AC
  Filled 2021-10-28: qty 2

## 2021-10-28 MED ORDER — SODIUM CHLORIDE 0.9 % IV SOLN: INTRAVENOUS | Status: AC

## 2021-10-28 MED ORDER — VERAPAMIL HCL 2.5 MG/ML IV SOLN
INTRAVENOUS | Status: AC
  Filled 2021-10-28: qty 2

## 2021-10-28 MED ORDER — ASPIRIN 81 MG PO CHEW: 81.0000 mg | CHEWABLE_TABLET | ORAL | Status: DC

## 2021-10-28 SURGICAL SUPPLY — 17 items
BAND ZEPHYR COMPRESS 30 LONG (HEMOSTASIS) IMPLANT
CATH INFINITI 5FR MULTPACK ANG (CATHETERS) IMPLANT
CATH OPTITORQUE TIG 4.0 5F (CATHETERS) IMPLANT
CLOSURE MYNX CONTROL 5F (Vascular Products) IMPLANT
GLIDESHEATH SLEND A-KIT 6F 22G (SHEATH) IMPLANT
GUIDEWIRE INQWIRE 1.5J.035X260 (WIRE) IMPLANT
INQWIRE 1.5J .035X260CM (WIRE) ×1
KIT HEART LEFT (KITS) ×1 IMPLANT
KIT MICROPUNCTURE NIT STIFF (SHEATH) IMPLANT
PACK CARDIAC CATHETERIZATION (CUSTOM PROCEDURE TRAY) ×1 IMPLANT
SHEATH PINNACLE 5F 10CM (SHEATH) IMPLANT
SHEATH PROBE COVER 6X72 (BAG) IMPLANT
SYR MEDRAD MARK 7 150ML (SYRINGE) ×1 IMPLANT
TRANSDUCER W/STOPCOCK (MISCELLANEOUS) ×1 IMPLANT
TUBING CIL FLEX 10 FLL-RA (TUBING) ×1 IMPLANT
WIRE EMERALD 3MM-J .035X150CM (WIRE) IMPLANT
WIRE HI TORQ VERSACORE-J 145CM (WIRE) IMPLANT

## 2021-10-28 NOTE — H&P (Addendum)
OV 10/23/2021 copied for documentation, with addendum in bold.    Primary Physician/Referring:  Holland Commons, FNP  Patient ID: Martin Clark, male    DOB: February 19, 1945, 76 y.o.   MRN: [id number]  No chief complaint on file.  HPI:    Martin Clark  is a 76 y.o. male with past medical history significant for hypertension, hyperlipidemia, and obesity who is having a telephone visit due to illness in the family and he is not presently in the area.  His nuclear stress test was grossly positive for reversible ischemia. Stress test was performed due to elevated calcium score >400-specifically at 743, 74th percentile.  This includes calcium score of 118 in the left main.  At present, he does not have any obvious chest pain or shortness of breath symptoms at the level of activity he does.  Past Medical History:  Diagnosis Date   Dyslipidemia    History of concussion    Hypertension    Hypothyroidism    Memory disorder    MS (multiple sclerosis) ([place])    Obesity    Restless legs syndrome    Retinal detachment    right   RLS (restless legs syndrome) 07/01/2016   Thyroid cancer ([place])    Past Surgical History:  Procedure Laterality Date   CATARACT EXTRACTION Right    renal calculi     Thyroid cancer resection     TONSILLECTOMY     VASECTOMY     VASECTOMY     Family History  Problem Relation Age of Onset   Heart failure Mother    Heart failure Father     Social History   Tobacco Use   Smoking status: Never   Smokeless tobacco: Never  Substance Use Topics   Alcohol use: Yes    Alcohol/week: 0.0 standard drinks of alcohol    Comment: monthly   Marital Status: Married  ROS  Review of Systems  Cardiovascular:  Positive for dyspnea on exertion.  All other systems reviewed and are negative.  Objective  There were no vitals taken for this visit. There is no height or weight on file to calculate BMI.     09/17/2021    1:47 PM 05/06/2021    2:43 PM 10/22/2020    1:57 PM  Vitals  with BMI  Height 5' 10" 5' 10" 5' 10"  Weight 232 lbs 227 lbs 220 lbs  BMI 33.29 41.96 22.29  Systolic 798 921 194  Diastolic 63 64 73  Pulse 73 73 70      Medications and allergies  No Known Allergies   Medication list after today's encounter   Current Outpatient Medications:    Ascorbic Acid (VITAMIN C) 1000 MG tablet, Take 1,000 mg by mouth daily., Disp: , Rfl:    aspirin EC 81 MG tablet, Take 81 mg by mouth daily., Disp: , Rfl:    cholecalciferol (VITAMIN D3) 25 MCG (1000 UNIT) tablet, Take 1,000 Units by mouth daily., Disp: , Rfl:    Cyanocobalamin (VITAMIN B12) 1000 MCG TBCR, Take by mouth., Disp: , Rfl:    levothyroxine (SYNTHROID, LEVOTHROID) 150 MCG tablet, Take 150 mcg by mouth daily before breakfast., Disp: , Rfl:    lisinopril-hydrochlorothiazide (ZESTORETIC) 20-25 MG tablet, Take 1 tablet by mouth daily., Disp: , Rfl:    NON FORMULARY, Take 10 mg by mouth daily at 6 (six) AM. Sleep 3, Disp: , Rfl:    OVER THE COUNTER MEDICATION, Take 1 tablet by mouth daily at 6 (six) AM.  flexuran take daily, Disp: , Rfl:    pramipexole (MIRAPEX) 1 MG tablet, Take 1 tablet (1 mg total) by mouth at bedtime. (Patient taking differently: Take 1 mg by mouth daily.), Disp: 90 tablet, Rfl: 3   rosuvastatin (CRESTOR) 20 MG tablet, Take 20 mg by mouth daily., Disp: , Rfl:   Laboratory examination:   Lab Results  Component Value Date   NA 143 10/16/2020   K 3.8 10/16/2020   CO2 27 10/16/2020   GLUCOSE 145 (H) 10/16/2020   BUN 30 (H) 10/16/2020   CREATININE 1.46 (H) 10/16/2020   CALCIUM 9.7 10/16/2020   EGFR 50 (L) 10/16/2020   GFRNONAA >60 05/06/2020       Latest Ref Rng & Units 11/20/2020    2:07 PM 10/16/2020    2:14 PM 10/02/2020    2:52 PM  CMP  Glucose 65 - 99 mg/dL  145  133   BUN 8 - 27 mg/dL  30  34   Creatinine 0.76 - 1.27 mg/dL  1.46  1.25   Sodium 134 - 144 mmol/L  143  143   Potassium 3.5 - 5.2 mmol/L  3.8  3.3   Chloride 96 - 106 mmol/L  102  102   CO2 20 - 29  mmol/L  27  24   Calcium 8.6 - 10.2 mg/dL  9.7  9.6   Total Protein 6.0 - 8.5 g/dL 6.6  6.8  6.6   Total Bilirubin 0.0 - 1.2 mg/dL 0.6  0.9  0.5   Alkaline Phos 44 - 121 IU/L 63  62  64   AST 0 - 40 IU/L _0 ALT 0 - 44 IU/L _1 Latest Ref Rng & Units 05/06/2020    3:05 AM 05/05/2020    3:32 AM 05/04/2020    4:54 AM  CBC  WBC 4.0 - 10.5 K/uL 7.6  7.5  9.0   Hemoglobin 13.0 - 17.0 g/dL 9.9  9.0  9.3   Hematocrit 39.0 - 52.0 % 29.8  26.9  28.2   Platelets 150 - 400 K/uL 265  208  211     Lipid Panel No results for input(s): "CHOL", "TRIG", "LDLCALC", "VLDL", "HDL", "CHOLHDL", "LDLDIRECT" in the last 8760 hours.  HEMOGLOBIN A1C No results found for: "HGBA1C", "MPG" TSH No results for input(s): "TSH" in the last 8760 hours.  External labs:     Radiology:    Cardiac Studies:   CORONARY CALCIUM SCORES 08/28/21: Left Main: 118 LAD: 260 LCx: 96.7 RCA: 269   Total Agatston Score: 743 MESA database percentile: 74   Echocardiogram 10/10/2021:  Left ventricle cavity is normal in size. Moderate concentric remodeling of the left ventricle.  Normal global wall motion. Normal LV systolic function with visual EF 55-60%. Doppler evidence of grade I (impaired) diastolic dysfunction, normal LAP. Left atrial cavity is mildly dilated.  Trileaflet aortic valve with mild calcification. Mildly increased LVOT velocity. In addition, there is mild aortic valve stenosis. Vmax 2.6 m/sec, mean PG 16 mmHg, AVA 3 cm by continuity equation. No regurgitation. The aortic root is normal. Mildly dilated ascending aorta at 4 cm. No evidence of pulmonary hypertension.  Compared to previous study in 2016, mild aortic stenosis is new. Ascending aorta measured 3.9 then.     Exercise nuclear stress test 10/01/21 Myocardial perfusion is abnormal. There is a reversible defect in the septal and apical regions (LAD territory). Moderate risk study.  Overall LV systolic function is normal with  regional wall motion abnormalities of the apical anterior wall, apical cap and apical septal wall. Stress LV EF: 85%.  Normal ECG stress. The patient exercised for 9 minutes and 6 seconds of a Bruce protocol, achieving approximately 10.31 METs and 88% MPHR. The heart rate response was normal. The blood pressure response was normal. No previous exam available for comparison.    EKG:   09/17/21 EKG: NSR    Assessment     ICD-10-CM   1. Essential hypertension  I10     2. Hyperlipidemia LDL goal <70  E78.5     3. Positive cardiac stress test  R94.39        No orders of the defined types were placed in this encounter.   No orders of the defined types were placed in this encounter.   There are no discontinued medications.    Recommendations:   Martin Clark is a 76 y.o. male with elevated coronary artery calcium score and positive nuclear stress test  Continue current cardiac medication Recommend low-sodium diet, prefer Dash diet. Schedule for cardiac catheterization, and possible angioplasty. We discussed regarding risks, benefits, alternatives to this including stress testing, CTA and continued medical therapy. Patient wants to proceed. Understands <1-2% risk of death, stroke, MI, urgent CABG, bleeding, infection, renal failure but not limited to these. Patient instructed not to do heavy lifting, heavy exertional activity, swimming until evaluation is complete.  Patient instructed to call if symptoms worse or to go to the ED for further evaluation. Follow-up in 1 week after catheterization or sooner if needed.    Floydene Flock, DO, [facility]  10/23/2021, 9:38 AM Office: [phone] Pager: [phone]

## 2021-10-28 NOTE — Interval H&P Note (Signed)
History and Physical Interval Note:  10/28/2021 8:36 AM  Martin Clark  has presented today for surgery, with the diagnosis of positive stress test, doe.  The various methods of treatment have been discussed with the patient and family. After consideration of risks, benefits and other options for treatment, the patient has consented to  Procedure(s): LEFT HEART CATH AND CORONARY ANGIOGRAPHY (N/A) as a surgical intervention.  The patient's history has been reviewed, patient examined, no change in status, stable for surgery.  I have reviewed the patient's chart and labs.  Questions were answered to the patient's satisfaction.    2016/2017 Appropriate Use Criteria for Coronary Revascularization Symptom Status: Asymptomatic  Non-invasive Testing: High risk  If no or indeterminate stress test, FFR/iFR results in all diseased vessels: N/A  Diabetes Mellitus: No  S/P CABG: No  Antianginal therapy (number of long-acting drugs): 0  Patient undergoing renal transplant: No  Patient undergoing percutaneous valve procedure: No  1 Vessel Disease PCI CABG  No proximal LAD involvement, No proximal left dominant LCX involvement M (4); Indication 2 R (3); Indication 2  Proximal left dominant LCX involvement M (5); Indication 5 M (5); Indication 5  Proximal LAD involvement M (5); Indication 5 M (5); Indication 5  2 Vessel Disease  No proximal LAD involvement M (5); Indication 8 M (4); Indication 8  Proximal LAD involvement M (6); Indication 11 M (6); Indication 11  3 Vessel Disease  Low disease complexity (e.g., focal stenoses, SYNTAX <=22) M (6); Indication 17 A (7); Indication 17  Intermediate or high disease complexity (e.g., SYNTAX >=23) M (5); Indication 21 A (7); Indication 21  Left Main Disease  Isolated LMCA disease: ostial or midshaft M (6); Indication 24 A (8); Indication 24  Isolated LMCA disease: bifurcation involvement M (5); Indication 25 A (8); Indication 25  LMCA ostial or midshaft,  concurrent low disease burden multivessel disease (e.g., 1-2 additional focal stenoses, SYNTAX <=22) M (6); Indication 26 A (8); Indication 26  LMCA ostial or midshaft, concurrent intermediate or high disease burden multivessel disease (e.g., 1-2 additional bifurcation stenoses, long stenoses, SYNTAX >=23) M (4); Indication 27 A (9); Indication 27  LMCA bifurcation involvement, concurrent low disease burden multivessel disease (e.g., 1-2 additional focal stenoses, SYNTAX <=22) M (4); Indication 28 A (8); Indication 28  LMCA bifurcation involvement, concurrent intermediate or high disease burden multivessel disease (e.g., 1-2 additional bifurcation stenoses, long stenoses, SYNTAX >=23) R (3); Indication 29 A (8); Indication [name]

## 2021-10-29 ENCOUNTER — Encounter (HOSPITAL_COMMUNITY): Payer: Self-pay | Admitting: Cardiology

## 2021-10-29 MED FILL — Heparin Sodium (Porcine) Inj 1000 Unit/ML: INTRAMUSCULAR | Qty: 20 | Status: AC

## 2021-10-29 MED FILL — Nitroglycerin IV Soln 100 MCG/ML in D5W: INTRA_ARTERIAL | Qty: 10 | Status: AC

## 2021-11-17 DIAGNOSIS — J069 Acute upper respiratory infection, unspecified: Secondary | ICD-10-CM | POA: Diagnosis not present

## 2021-12-22 DIAGNOSIS — E039 Hypothyroidism, unspecified: Secondary | ICD-10-CM | POA: Diagnosis not present

## 2021-12-22 DIAGNOSIS — E785 Hyperlipidemia, unspecified: Secondary | ICD-10-CM | POA: Diagnosis not present

## 2021-12-22 DIAGNOSIS — E559 Vitamin D deficiency, unspecified: Secondary | ICD-10-CM | POA: Diagnosis not present

## 2021-12-22 DIAGNOSIS — R7309 Other abnormal glucose: Secondary | ICD-10-CM | POA: Diagnosis not present

## 2021-12-30 DIAGNOSIS — I700 Atherosclerosis of aorta: Secondary | ICD-10-CM | POA: Diagnosis not present

## 2021-12-30 DIAGNOSIS — E785 Hyperlipidemia, unspecified: Secondary | ICD-10-CM | POA: Diagnosis not present

## 2021-12-30 DIAGNOSIS — E039 Hypothyroidism, unspecified: Secondary | ICD-10-CM | POA: Diagnosis not present

## 2021-12-30 DIAGNOSIS — I10 Essential (primary) hypertension: Secondary | ICD-10-CM | POA: Diagnosis not present

## 2021-12-30 DIAGNOSIS — R7309 Other abnormal glucose: Secondary | ICD-10-CM | POA: Diagnosis not present

## 2021-12-30 DIAGNOSIS — R748 Abnormal levels of other serum enzymes: Secondary | ICD-10-CM | POA: Diagnosis not present

## 2021-12-30 DIAGNOSIS — F5101 Primary insomnia: Secondary | ICD-10-CM | POA: Diagnosis not present

## 2021-12-30 DIAGNOSIS — I2510 Atherosclerotic heart disease of native coronary artery without angina pectoris: Secondary | ICD-10-CM | POA: Diagnosis not present

## 2021-12-30 DIAGNOSIS — Z0000 Encounter for general adult medical examination without abnormal findings: Secondary | ICD-10-CM | POA: Diagnosis not present

## 2021-12-30 DIAGNOSIS — E559 Vitamin D deficiency, unspecified: Secondary | ICD-10-CM | POA: Diagnosis not present

## 2022-01-09 DIAGNOSIS — Z1212 Encounter for screening for malignant neoplasm of rectum: Secondary | ICD-10-CM | POA: Diagnosis not present

## 2022-01-09 DIAGNOSIS — Z1211 Encounter for screening for malignant neoplasm of colon: Secondary | ICD-10-CM | POA: Diagnosis not present

## 2022-01-17 LAB — COLOGUARD: COLOGUARD: NEGATIVE

## 2022-02-10 DIAGNOSIS — E039 Hypothyroidism, unspecified: Secondary | ICD-10-CM | POA: Diagnosis not present

## 2022-02-10 DIAGNOSIS — R748 Abnormal levels of other serum enzymes: Secondary | ICD-10-CM | POA: Diagnosis not present

## 2022-02-17 DIAGNOSIS — R748 Abnormal levels of other serum enzymes: Secondary | ICD-10-CM | POA: Diagnosis not present

## 2022-02-17 DIAGNOSIS — E039 Hypothyroidism, unspecified: Secondary | ICD-10-CM | POA: Diagnosis not present

## 2022-02-17 DIAGNOSIS — I10 Essential (primary) hypertension: Secondary | ICD-10-CM | POA: Diagnosis not present

## 2022-03-12 ENCOUNTER — Encounter: Payer: Self-pay | Admitting: Internal Medicine

## 2022-03-12 ENCOUNTER — Ambulatory Visit: Payer: PPO | Admitting: Internal Medicine

## 2022-03-12 VITALS — BP 118/68 | HR 70 | Ht 70.0 in | Wt 231.0 lb

## 2022-03-12 DIAGNOSIS — R931 Abnormal findings on diagnostic imaging of heart and coronary circulation: Secondary | ICD-10-CM

## 2022-03-12 DIAGNOSIS — E785 Hyperlipidemia, unspecified: Secondary | ICD-10-CM | POA: Diagnosis not present

## 2022-03-12 DIAGNOSIS — I10 Essential (primary) hypertension: Secondary | ICD-10-CM | POA: Diagnosis not present

## 2022-03-12 NOTE — Progress Notes (Signed)
Primary Physician/Referring:  Holland Commons, FNP  Patient ID: Martin Clark, male    DOB: 12/26/1945, 77 y.o.   MRN: [id number]  Chief Complaint  Patient presents with   Hypertension   Follow-up    HPI:    Martin Clark  is a 77 y.o. male with past medical history significant for hypertension, hyperlipidemia, and obesity who is here for a follow-up visit.  Patient states that he has been feeling great since the last time he was here.  He underwent cardiac catheterization which showed mild luminal irregularities so his stress test was likely a false positive.  Patient is working on losing some weight.  He is going to the gym with his wife and he has already lost about 15 pounds.  He denies chest pain, shortness of breath, palpitations, diaphoresis, syncope.  Past Medical History:  Diagnosis Date   Dyslipidemia    History of concussion    Hypertension    Hypothyroidism    Memory disorder    MS (multiple sclerosis) ([place])    Obesity    Restless legs syndrome    Retinal detachment    right   RLS (restless legs syndrome) 07/01/2016   Thyroid cancer ([place])    Past Surgical History:  Procedure Laterality Date   CATARACT EXTRACTION Right    LEFT HEART CATH AND CORONARY ANGIOGRAPHY N/A 10/28/2021   Procedure: LEFT HEART CATH AND CORONARY ANGIOGRAPHY;  Surgeon: Nigel Mormon, MD;  Location: [facility];  Service: Cardiovascular;  Laterality: N/A;   renal calculi     Thyroid cancer resection     TONSILLECTOMY     VASECTOMY     VASECTOMY     Family History  Problem Relation Age of Onset   Heart failure Mother    Heart failure Father     Social History   Tobacco Use   Smoking status: Never   Smokeless tobacco: Never  Substance Use Topics   Alcohol use: Yes    Alcohol/week: 0.0 standard drinks of alcohol    Comment: monthly   Marital Status: Married  ROS  Review of Systems  Cardiovascular:  Negative for dyspnea on exertion.  All other systems reviewed and  are negative.  Objective  Blood pressure 118/68, pulse 70, height '5\' 10"'$  (1.778 m), weight 231 lb (104.8 kg), SpO2 96 %. Body mass index is 33.15 kg/m.     03/12/2022    9:27 AM 10/28/2021   11:45 AM 10/28/2021   11:35 AM  Vitals with BMI  Height '5\' 10"'$     Weight 231 lbs    BMI 93.79    Systolic 024    Diastolic 68    Pulse 70 61 65   Physical Exam Vitals reviewed.  Constitutional:      Appearance: Normal appearance.  HENT:     Head: Normocephalic and atraumatic.  Neck:     Vascular: No carotid bruit.  Cardiovascular:     Rate and Rhythm: Normal rate and regular rhythm.     Pulses: Normal pulses.     Heart sounds: Murmur heard.  Pulmonary:     Effort: Pulmonary effort is normal.     Breath sounds: Normal breath sounds.  Abdominal:     General: Bowel sounds are normal.     Palpations: Abdomen is soft.  Musculoskeletal:     Right lower leg: No edema.     Left lower leg: No edema.  Skin:    General: Skin is warm and dry.  Neurological:     Mental Status: He is alert.    Medications and allergies  No Known Allergies   Medication list after today's encounter   Current Outpatient Medications:    cholecalciferol (VITAMIN D3) 25 MCG (1000 UNIT) tablet, Take 1,000 Units by mouth daily., Disp: , Rfl:    Cyanocobalamin (VITAMIN B12) 1000 MCG TBCR, Take 1,000 mcg by mouth daily., Disp: , Rfl:    levothyroxine (SYNTHROID, LEVOTHROID) 150 MCG tablet, Take 150 mcg by mouth daily before breakfast., Disp: , Rfl:    lisinopril-hydrochlorothiazide (ZESTORETIC) 20-25 MG tablet, Take 1 tablet by mouth daily., Disp: , Rfl:    NON FORMULARY, Take 10 mg by mouth at bedtime. Sleep 3, Disp: , Rfl:    OVER THE COUNTER MEDICATION, Take 1 tablet by mouth at bedtime. flexuran take daily, Disp: , Rfl:    pramipexole (MIRAPEX) 1 MG tablet, Take 1 tablet (1 mg total) by mouth at bedtime., Disp: 90 tablet, Rfl: 3   rosuvastatin (CRESTOR) 20 MG tablet, Take 20 mg by mouth daily., Disp: , Rfl:     Ascorbic Acid (VITAMIN C) 500 MG CHEW, Chew 1 tablet by mouth daily., Disp: , Rfl:   Laboratory examination:   Lab Results  Component Value Date   NA 141 10/24/2021   K 3.8 10/24/2021   CO2 26 10/24/2021   GLUCOSE 94 10/24/2021   BUN 28 (H) 10/24/2021   CREATININE 1.13 10/24/2021   CALCIUM 9.3 10/24/2021   EGFR 68 10/24/2021   GFRNONAA >60 05/06/2020       Latest Ref Rng & Units 10/24/2021    1:09 PM 11/20/2020    2:07 PM 10/16/2020    2:14 PM  CMP  Glucose 70 - 99 mg/dL 94   145   BUN 8 - 27 mg/dL 28   30   Creatinine 0.76 - 1.27 mg/dL 1.13   1.46   Sodium 134 - 144 mmol/L 141   143   Potassium 3.5 - 5.2 mmol/L 3.8   3.8   Chloride 96 - 106 mmol/L 100   102   CO2 20 - 29 mmol/L 26   27   Calcium 8.6 - 10.2 mg/dL 9.3   9.7   Total Protein 6.0 - 8.5 g/dL  6.6  6.8   Total Bilirubin 0.0 - 1.2 mg/dL  0.6  0.9   Alkaline Phos 44 - 121 IU/L  63  62   AST 0 - 40 IU/L  23  23   ALT 0 - 44 IU/L  17  18       Latest Ref Rng & Units 10/24/2021    1:09 PM 05/06/2020    3:05 AM 05/05/2020    3:32 AM  CBC  WBC 3.4 - 10.8 x10E3/uL 5.8  7.6  7.5   Hemoglobin 13.0 - 17.7 g/dL 12.8  9.9  9.0   Hematocrit 37.5 - 51.0 % 38.0  29.8  26.9   Platelets 150 - 450 x10E3/uL 183  265  208     Lipid Panel No results for input(s): "CHOL", "TRIG", "Crest", "VLDL", "HDL", "CHOLHDL", "LDLDIRECT" in the last 8760 hours.  HEMOGLOBIN A1C No results found for: "HGBA1C", "MPG" TSH No results for input(s): "TSH" in the last 8760 hours.  External labs:     Radiology:    Cardiac Studies:   CORONARY CALCIUM SCORES 08/28/21: Left Main: 118 LAD: 260 LCx: 96.7 RCA: 269   Total Agatston Score: 743 MESA database percentile: 74   Echocardiogram 10/10/2021:  Left ventricle cavity is normal in size. Moderate concentric remodeling of the left ventricle.  Normal global wall motion. Normal LV systolic function with visual EF 55-60%. Doppler evidence of grade I (impaired) diastolic dysfunction,  normal LAP. Left atrial cavity is mildly dilated.  Trileaflet aortic valve with mild calcification. Mildly increased LVOT velocity. In addition, there is mild aortic valve stenosis. Vmax 2.6 m/sec, mean PG 16 mmHg, AVA 3 cm by continuity equation. No regurgitation. The aortic root is normal. Mildly dilated ascending aorta at 4 cm. No evidence of pulmonary hypertension.  Compared to previous study in 2016, mild aortic stenosis is new. Ascending aorta measured 3.9 then.     Exercise nuclear stress test 10/01/21 Myocardial perfusion is abnormal. There is a reversible defect in the septal and apical regions (LAD territory). Moderate risk study.  Overall LV systolic function is normal with regional wall motion abnormalities of the apical anterior wall, apical cap and apical septal wall. Stress LV EF: 85%.  Normal ECG stress. The patient exercised for 9 minutes and 6 seconds of a Bruce protocol, achieving approximately 10.31 METs and 88% MPHR. The heart rate response was normal. The blood pressure response was normal. No previous exam available for comparison.   LHC 10/28/2021 LM: Normal LAD: Minimal luminal irregularities Lcx: Minimal luminal irregularities RCA: Minimal luminal irregularities   LVEDP normal   EKG:   09/17/21 EKG: NSR    Assessment     ICD-10-CM   1. Hyperlipidemia LDL goal <70  E78.5     2. Essential hypertension  I10     3. Agatston coronary artery calcium score greater than 400  R93.1        No orders of the defined types were placed in this encounter.   No orders of the defined types were placed in this encounter.   There are no discontinued medications.    Recommendations:   Martin Clark is a 77 y.o. male with elevated coronary artery calcium score and positive nuclear stress test   Hyperlipidemia LDL goal <70 Continue crestor   Essential hypertension Continue current cardiac medication Recommend low-sodium diet, prefer Dash diet. Blood pressure  very well controlled   Agatston coronary artery calcium score greater than 400 Stress test appears to have been false positive Cardiac catheterization showed nonobstructive CAD Blood pressure is very well-controlled Patient tolerating Crestor 20 mg Follow-up in 12 months or sooner if needed     Floydene Flock, DO, [facility]  03/12/2022, 10:06 AM Office: [phone] Pager: [phone]

## 2022-03-18 ENCOUNTER — Other Ambulatory Visit: Payer: Self-pay | Admitting: Registered Nurse

## 2022-03-18 DIAGNOSIS — R911 Solitary pulmonary nodule: Secondary | ICD-10-CM

## 2022-04-15 ENCOUNTER — Ambulatory Visit
Admission: RE | Admit: 2022-04-15 | Discharge: 2022-04-15 | Disposition: A | Discharge status: Home / Self Care | Payer: PPO | Source: Ambulatory Visit | Attending: Registered Nurse | Admitting: Registered Nurse

## 2022-04-15 DIAGNOSIS — R911 Solitary pulmonary nodule: Secondary | ICD-10-CM

## 2022-04-15 DIAGNOSIS — J9811 Atelectasis: Secondary | ICD-10-CM | POA: Diagnosis not present

## 2022-04-15 DIAGNOSIS — I700 Atherosclerosis of aorta: Secondary | ICD-10-CM | POA: Diagnosis not present

## 2022-04-30 ENCOUNTER — Encounter (INDEPENDENT_AMBULATORY_CARE_PROVIDER_SITE_OTHER): Payer: PPO | Admitting: Ophthalmology

## 2022-04-30 DIAGNOSIS — H35371 Puckering of macula, right eye: Secondary | ICD-10-CM | POA: Diagnosis not present

## 2022-04-30 DIAGNOSIS — H43813 Vitreous degeneration, bilateral: Secondary | ICD-10-CM | POA: Diagnosis not present

## 2022-05-07 ENCOUNTER — Telehealth: Payer: Self-pay | Admitting: Neurology

## 2022-05-07 ENCOUNTER — Encounter: Payer: Self-pay | Admitting: Neurology

## 2022-05-07 ENCOUNTER — Ambulatory Visit (INDEPENDENT_AMBULATORY_CARE_PROVIDER_SITE_OTHER): Payer: PPO | Admitting: Neurology

## 2022-05-07 VITALS — BP 99/54 | HR 75 | Ht 70.0 in | Wt 230.0 lb

## 2022-05-07 DIAGNOSIS — Z8739 Personal history of other diseases of the musculoskeletal system and connective tissue: Secondary | ICD-10-CM | POA: Diagnosis not present

## 2022-05-07 DIAGNOSIS — G2581 Restless legs syndrome: Secondary | ICD-10-CM

## 2022-05-07 DIAGNOSIS — G35 Multiple sclerosis: Secondary | ICD-10-CM

## 2022-05-07 DIAGNOSIS — G35D Multiple sclerosis, unspecified: Secondary | ICD-10-CM

## 2022-05-07 DIAGNOSIS — R269 Unspecified abnormalities of gait and mobility: Secondary | ICD-10-CM
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
## 2022-05-07 NOTE — Telephone Encounter (Signed)
[employer] advantage NPR sent to GI [phone] 

## 2022-05-20 DIAGNOSIS — J069 Acute upper respiratory infection, unspecified: Secondary | ICD-10-CM | POA: Diagnosis not present

## 2022-05-20 DIAGNOSIS — R051 Acute cough: Secondary | ICD-10-CM | POA: Diagnosis not present

## 2022-06-03 ENCOUNTER — Ambulatory Visit
Admission: RE | Admit: 2022-06-03 | Discharge: 2022-06-03 | Disposition: A | Discharge status: Home / Self Care | Payer: PPO | Source: Ambulatory Visit | Attending: Neurology | Admitting: Neurology

## 2022-06-03 DIAGNOSIS — G35 Multiple sclerosis: Secondary | ICD-10-CM

## 2022-06-03 MED ORDER — GADOPICLENOL 0.5 MMOL/ML IV SOLN
10.0000 mL | Freq: Once | INTRAVENOUS | Status: AC | PRN
  Administered 2022-06-03: 10 mL via INTRAVENOUS

## 2022-06-30 DIAGNOSIS — E039 Hypothyroidism, unspecified: Secondary | ICD-10-CM | POA: Diagnosis not present

## 2022-06-30 DIAGNOSIS — E559 Vitamin D deficiency, unspecified: Secondary | ICD-10-CM | POA: Diagnosis not present

## 2022-06-30 DIAGNOSIS — R7309 Other abnormal glucose: Secondary | ICD-10-CM | POA: Diagnosis not present

## 2022-06-30 DIAGNOSIS — E785 Hyperlipidemia, unspecified: Secondary | ICD-10-CM | POA: Diagnosis not present

## 2022-06-30 DIAGNOSIS — Z125 Encounter for screening for malignant neoplasm of prostate: Secondary | ICD-10-CM | POA: Diagnosis not present

## 2022-07-07 DIAGNOSIS — E039 Hypothyroidism, unspecified: Secondary | ICD-10-CM | POA: Diagnosis not present

## 2022-07-07 DIAGNOSIS — E559 Vitamin D deficiency, unspecified: Secondary | ICD-10-CM | POA: Diagnosis not present

## 2022-07-07 DIAGNOSIS — F5101 Primary insomnia: Secondary | ICD-10-CM | POA: Diagnosis not present

## 2022-07-07 DIAGNOSIS — I700 Atherosclerosis of aorta: Secondary | ICD-10-CM | POA: Diagnosis not present

## 2022-07-07 DIAGNOSIS — G35 Multiple sclerosis: Secondary | ICD-10-CM | POA: Diagnosis not present

## 2022-07-07 DIAGNOSIS — E119 Type 2 diabetes mellitus without complications: Secondary | ICD-10-CM | POA: Diagnosis not present

## 2022-07-07 DIAGNOSIS — N182 Chronic kidney disease, stage 2 (mild): Secondary | ICD-10-CM | POA: Diagnosis not present

## 2022-07-07 DIAGNOSIS — I2510 Atherosclerotic heart disease of native coronary artery without angina pectoris: Secondary | ICD-10-CM | POA: Diagnosis not present

## 2022-07-07 DIAGNOSIS — E785 Hyperlipidemia, unspecified: Secondary | ICD-10-CM | POA: Diagnosis not present

## 2022-07-07 DIAGNOSIS — I10 Essential (primary) hypertension: Secondary | ICD-10-CM | POA: Diagnosis not present

## 2022-07-08 ENCOUNTER — Encounter: Payer: Self-pay | Admitting: Internal Medicine

## 2022-08-20 IMAGING — CT CT RENAL STONE PROTOCOL
2 of 4 series · 16 of 46 positions shown, 18 images · non-contrast
Comparison: CT abdomen pelvis dated September 03, 2011.

CLINICAL DATA: Fever and altered mental status. Increased urinary
frequency.

EXAM:
CT ABDOMEN AND PELVIS WITHOUT CONTRAST
TECHNIQUE: Multidetector CT imaging of the abdomen and pelvis was performed
following the standard protocol without IV contrast.

[Series 2: axial st · axial · 0.94mm/px · z∈[+1107,+1547]mm · 13 of 102 slices shown, 15 images]
[im 7/102  soft-tissue]
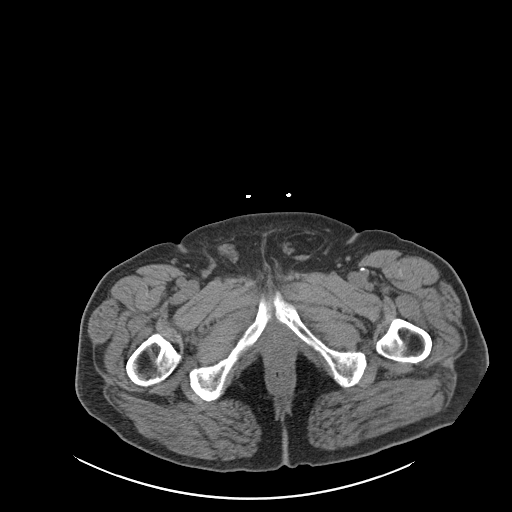
[im 7/102  bone]
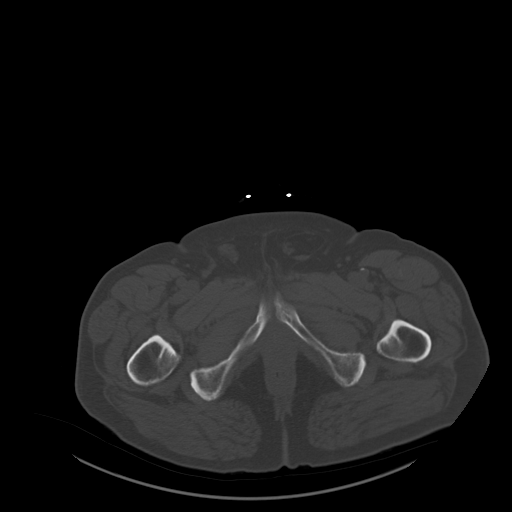
[im 13/102  soft-tissue]
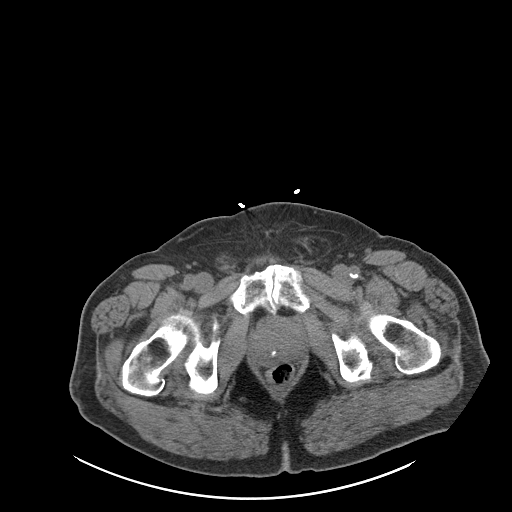
[im 19/102  soft-tissue]
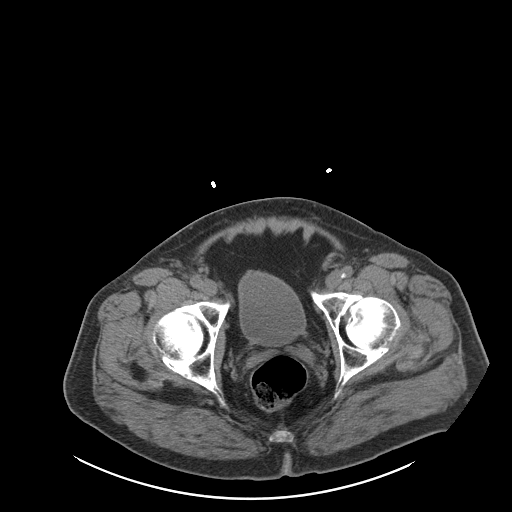
[im 32/102  soft-tissue]
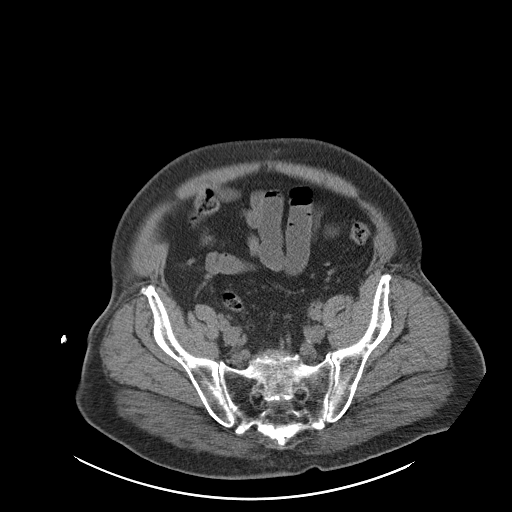
[im 38/102  soft-tissue]
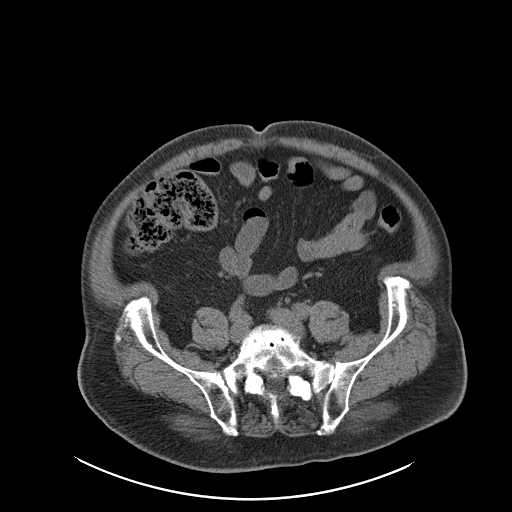
[im 45/102  soft-tissue]
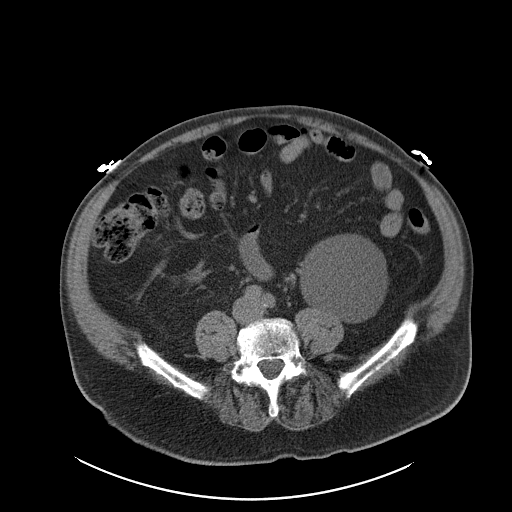
[im 51/102  soft-tissue]
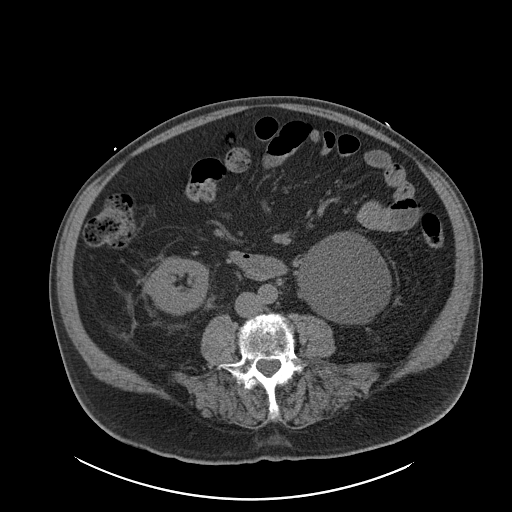
[im 57/102  soft-tissue]
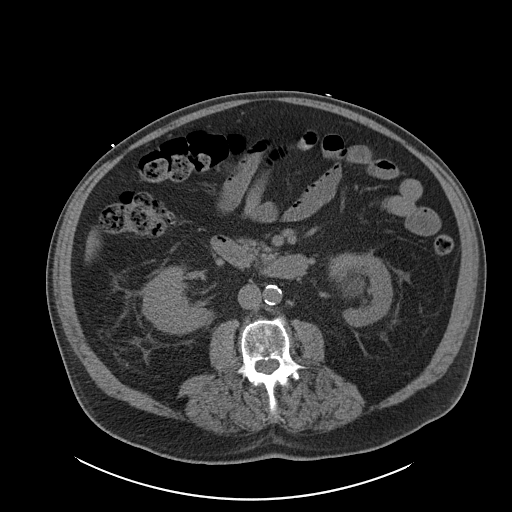
[im 64/102  soft-tissue]
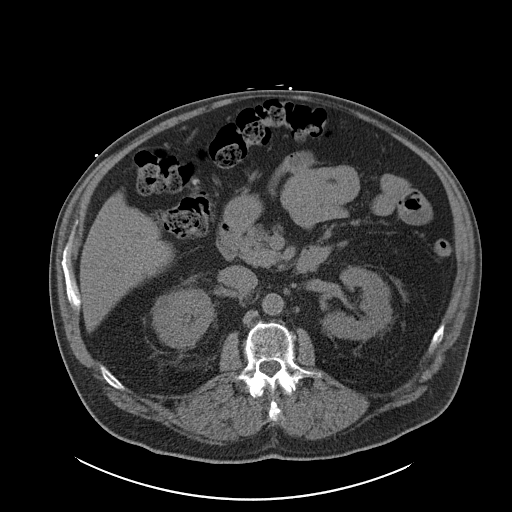
[im 64/102  bone]
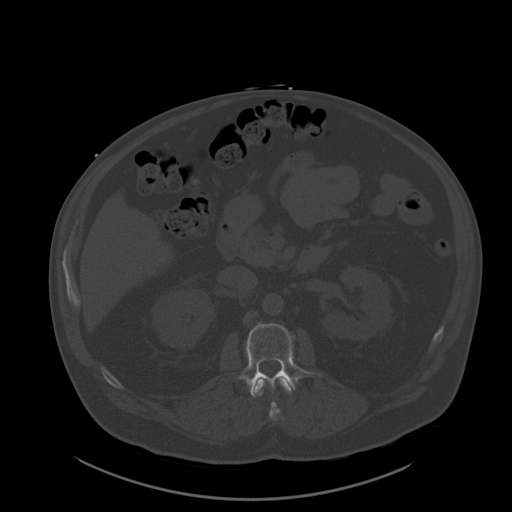
[im 70/102  soft-tissue]
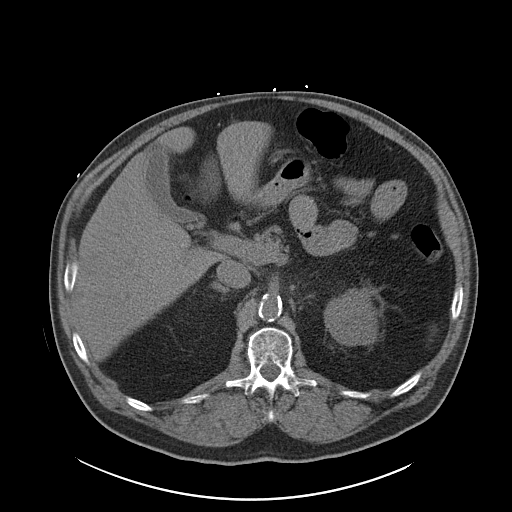
[im 83/102  soft-tissue]
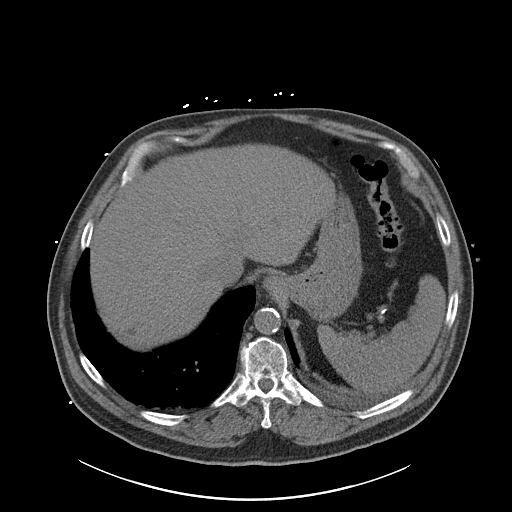
[im 89/102  soft-tissue]
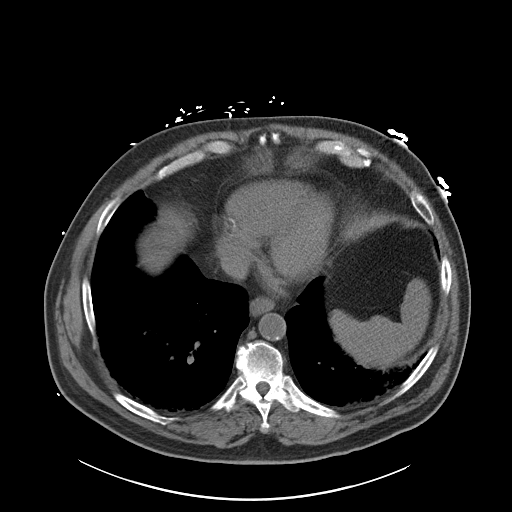
[im 95/102  soft-tissue]
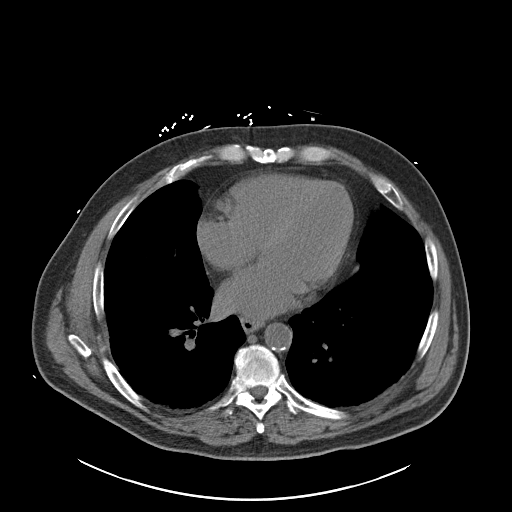

[Series 4: coronal · coronal · 1.06mm/px · 3 of 173 slices shown]
[im 58/173  soft-tissue]
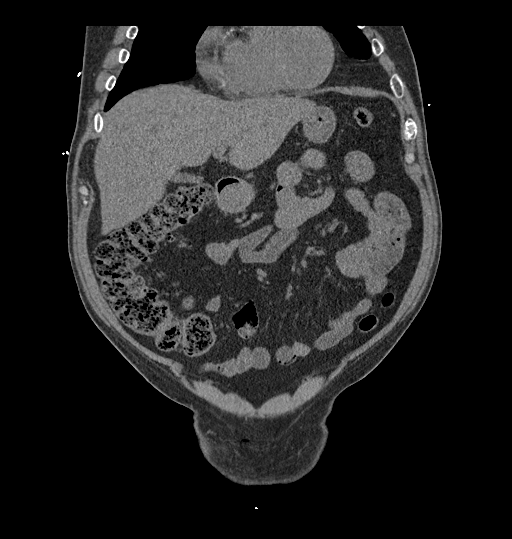
[im 77/173  soft-tissue]
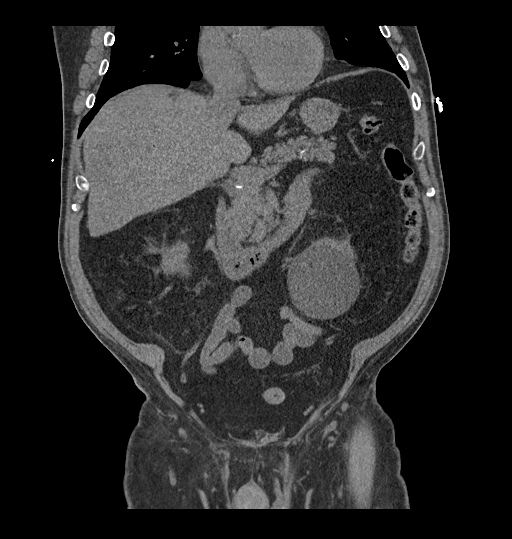
[im 96/173  soft-tissue]
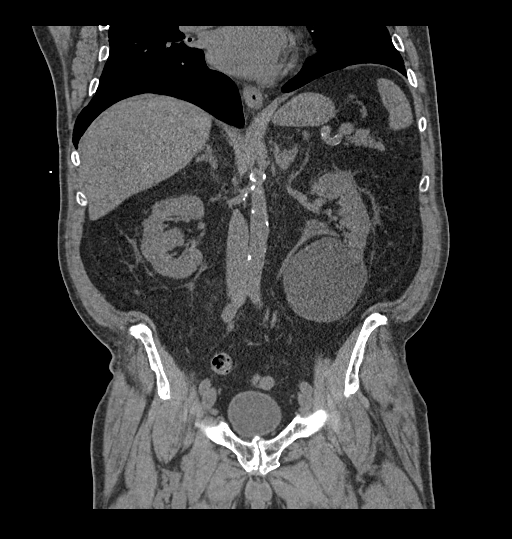

[16 of 46 positions shown; findings below may reference images not displayed]

FINDINGS: Lower chest: No acute abnormality. Dependent subsegmental
atelectasis in both lower lobes. Hypodense blood pool relative to
myocardium, suggestive of anemia.

Hepatobiliary: Multiple hepatic cysts are stable or slightly
increased in size since 8493. Unchanged 3.1 cm hypodense lesion in
the inferior right hepatic lobe, previously characterized as a
hemangioma. The gallbladder is unremarkable. No biliary dilatation.

Pancreas: Unremarkable. No pancreatic ductal dilatation or
surrounding inflammatory changes.

Spleen: Normal in size without focal abnormality.

Adrenals/Urinary Tract: The adrenal glands and right kidney are
unremarkable. Slight interval increase in size of the large left
renal simple cyst, currently measuring 9.1 cm, previously 7.9 cm. No
renal calculi or hydronephrosis. The bladder is unremarkable for the
degree of distention.

Stomach/Bowel: Stomach is within normal limits. Appendix appears
normal. No evidence of bowel wall thickening, distention, or
inflammatory changes.

Vascular/Lymphatic: Aortic atherosclerosis. No enlarged abdominal or
pelvic lymph nodes.

Reproductive: Prostate is unremarkable.

Other: Unchanged small fat containing left inguinal hernia. No free
fluid or pneumoperitoneum.

Musculoskeletal: No acute or significant osseous findings. Old
right-sided rib fractures.
IMPRESSION: 1. No acute intra-abdominal process.
2. Aortic Atherosclerosis ([id number]-PS6.6).

## 2022-08-20 IMAGING — DX DG CHEST 1V PORT
1 series · 1 of 1 positions shown · non-contrast
Comparison: None.

CLINICAL DATA: Cough, fever

EXAM:
PORTABLE CHEST 1 VIEW

[chest ap]
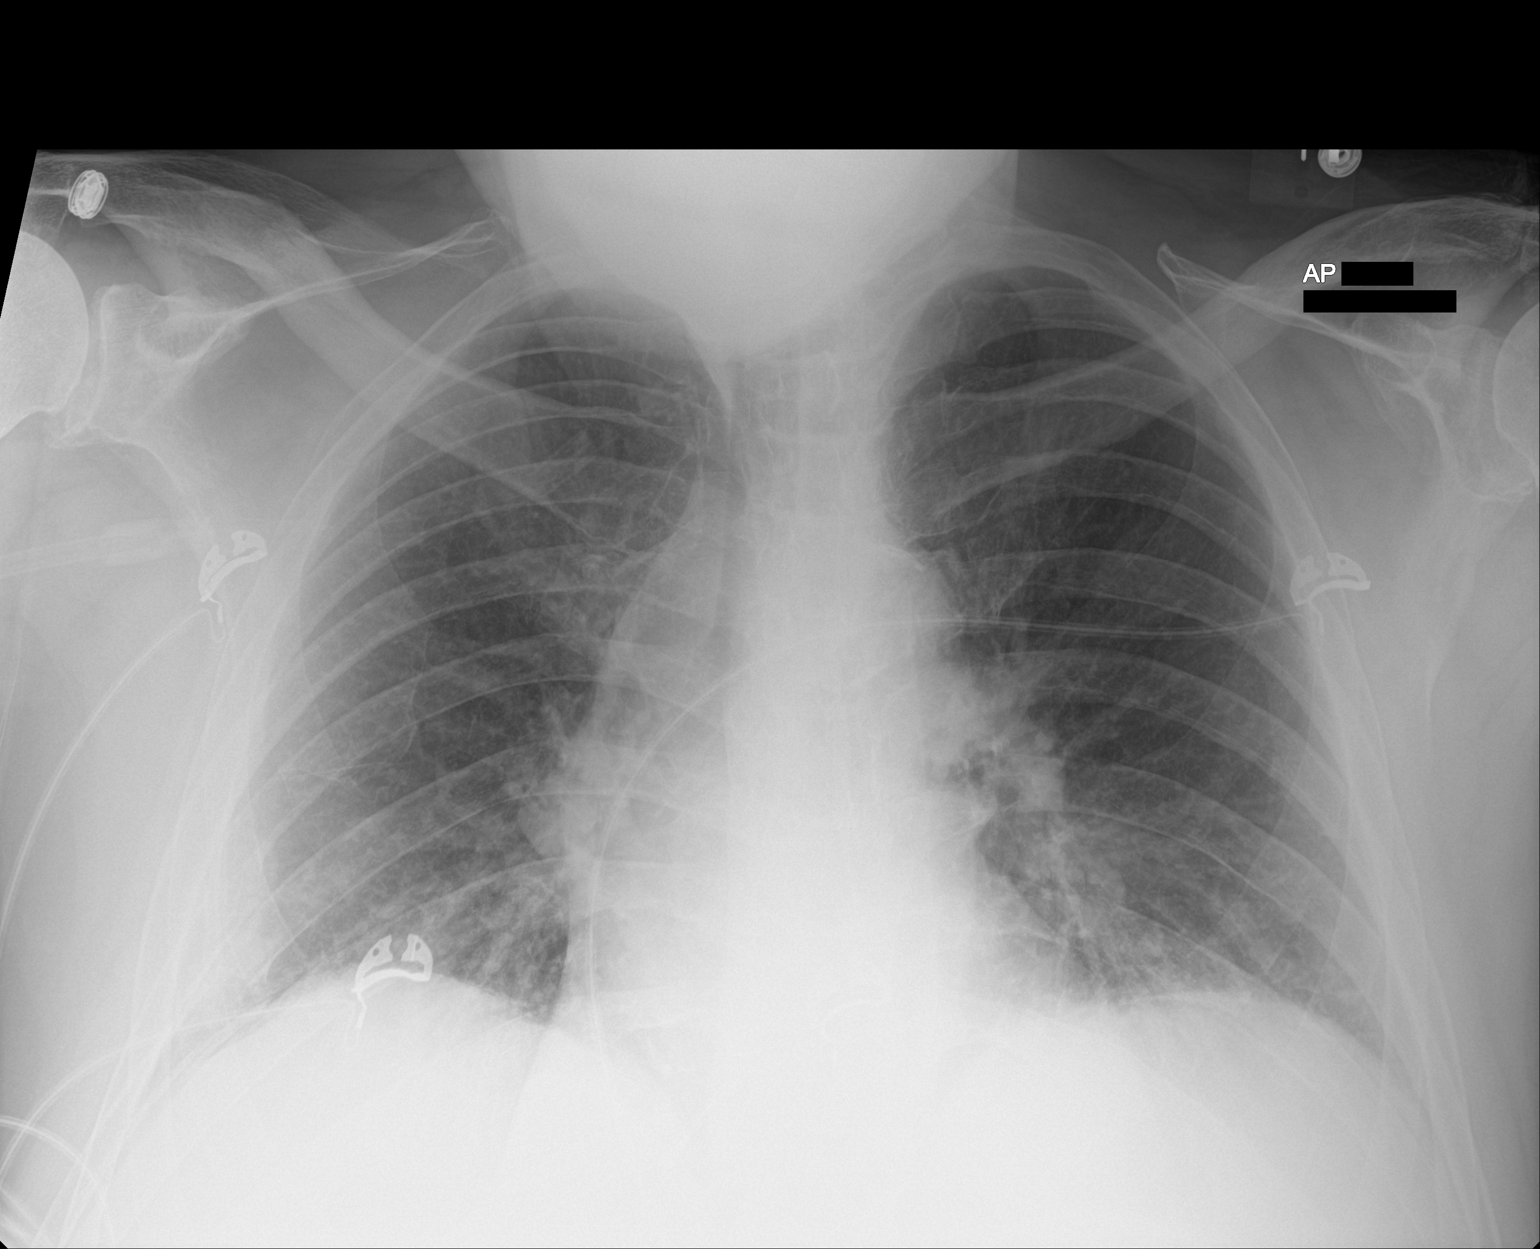

[1 of 1 positions shown; findings below may reference images not displayed]

FINDINGS: Mild bibasilar atelectasis. Lungs are otherwise clear. No
pneumothorax or pleural effusion. Cardiac size within normal limits.
Pulmonary vascularity is normal. No acute bone abnormality.
IMPRESSION: No active disease.

## 2022-09-28 DIAGNOSIS — R051 Acute cough: Secondary | ICD-10-CM | POA: Diagnosis not present

## 2022-09-28 DIAGNOSIS — J45909 Unspecified asthma, uncomplicated: Secondary | ICD-10-CM | POA: Diagnosis not present

## 2022-12-07 DIAGNOSIS — Z1283 Encounter for screening for malignant neoplasm of skin: Secondary | ICD-10-CM | POA: Diagnosis not present

## 2022-12-07 DIAGNOSIS — L981 Factitial dermatitis: Secondary | ICD-10-CM | POA: Diagnosis not present

## 2022-12-07 DIAGNOSIS — D225 Melanocytic nevi of trunk: Secondary | ICD-10-CM | POA: Diagnosis not present

## 2022-12-08 DIAGNOSIS — E559 Vitamin D deficiency, unspecified: Secondary | ICD-10-CM | POA: Diagnosis not present

## 2022-12-08 DIAGNOSIS — Z23 Encounter for immunization: Secondary | ICD-10-CM | POA: Diagnosis not present

## 2022-12-08 DIAGNOSIS — M2550 Pain in unspecified joint: Secondary | ICD-10-CM | POA: Diagnosis not present

## 2022-12-10 DIAGNOSIS — D649 Anemia, unspecified: Secondary | ICD-10-CM | POA: Diagnosis not present

## 2022-12-17 DIAGNOSIS — R7982 Elevated C-reactive protein (CRP): Secondary | ICD-10-CM | POA: Diagnosis not present

## 2022-12-17 DIAGNOSIS — R700 Elevated erythrocyte sedimentation rate: Secondary | ICD-10-CM | POA: Diagnosis not present

## 2022-12-17 DIAGNOSIS — D649 Anemia, unspecified: Secondary | ICD-10-CM | POA: Diagnosis not present

## 2022-12-17 DIAGNOSIS — R195 Other fecal abnormalities: Secondary | ICD-10-CM | POA: Diagnosis not present

## 2022-12-17 DIAGNOSIS — M2550 Pain in unspecified joint: Secondary | ICD-10-CM | POA: Diagnosis not present

## 2023-01-12 DIAGNOSIS — Z83719 Family history of colon polyps, unspecified: Secondary | ICD-10-CM | POA: Diagnosis not present

## 2023-01-12 DIAGNOSIS — R7982 Elevated C-reactive protein (CRP): Secondary | ICD-10-CM | POA: Diagnosis not present

## 2023-01-12 DIAGNOSIS — N182 Chronic kidney disease, stage 2 (mild): Secondary | ICD-10-CM | POA: Diagnosis not present

## 2023-01-12 DIAGNOSIS — E039 Hypothyroidism, unspecified: Secondary | ICD-10-CM | POA: Diagnosis not present

## 2023-01-12 DIAGNOSIS — I700 Atherosclerosis of aorta: Secondary | ICD-10-CM | POA: Diagnosis not present

## 2023-01-12 DIAGNOSIS — R7309 Other abnormal glucose: Secondary | ICD-10-CM | POA: Diagnosis not present

## 2023-01-12 DIAGNOSIS — R195 Other fecal abnormalities: Secondary | ICD-10-CM | POA: Diagnosis not present

## 2023-01-12 DIAGNOSIS — D649 Anemia, unspecified: Secondary | ICD-10-CM | POA: Diagnosis not present

## 2023-01-12 DIAGNOSIS — G35 Multiple sclerosis: Secondary | ICD-10-CM | POA: Diagnosis not present

## 2023-01-12 DIAGNOSIS — E559 Vitamin D deficiency, unspecified: Secondary | ICD-10-CM | POA: Diagnosis not present

## 2023-01-12 DIAGNOSIS — Z0000 Encounter for general adult medical examination without abnormal findings: Secondary | ICD-10-CM | POA: Diagnosis not present

## 2023-01-12 DIAGNOSIS — I10 Essential (primary) hypertension: Secondary | ICD-10-CM | POA: Diagnosis not present

## 2023-02-09 DIAGNOSIS — M353 Polymyalgia rheumatica: Secondary | ICD-10-CM | POA: Diagnosis not present

## 2023-02-09 DIAGNOSIS — Z7952 Long term (current) use of systemic steroids: Secondary | ICD-10-CM | POA: Diagnosis not present

## 2023-02-09 DIAGNOSIS — E785 Hyperlipidemia, unspecified: Secondary | ICD-10-CM | POA: Diagnosis not present

## 2023-02-09 DIAGNOSIS — M1990 Unspecified osteoarthritis, unspecified site: Secondary | ICD-10-CM | POA: Diagnosis not present

## 2023-02-09 DIAGNOSIS — E039 Hypothyroidism, unspecified: Secondary | ICD-10-CM | POA: Diagnosis not present

## 2023-02-09 DIAGNOSIS — R635 Abnormal weight gain: Secondary | ICD-10-CM | POA: Diagnosis not present

## 2023-02-11 ENCOUNTER — Other Ambulatory Visit: Payer: Self-pay

## 2023-02-11 ENCOUNTER — Encounter (HOSPITAL_COMMUNITY): Payer: Self-pay

## 2023-02-11 ENCOUNTER — Emergency Department (HOSPITAL_COMMUNITY)
Admission: EM | Admit: 2023-02-11 | Discharge: 2023-02-11 | Disposition: A | Discharge status: Home / Self Care | Payer: PPO | Attending: Emergency Medicine | Admitting: Emergency Medicine

## 2023-02-11 ENCOUNTER — Emergency Department (HOSPITAL_COMMUNITY): Payer: PPO

## 2023-02-11 DIAGNOSIS — Z85850 Personal history of malignant neoplasm of thyroid: Secondary | ICD-10-CM | POA: Diagnosis not present

## 2023-02-11 DIAGNOSIS — I10 Essential (primary) hypertension: Secondary | ICD-10-CM | POA: Insufficient documentation

## 2023-02-11 DIAGNOSIS — M546 Pain in thoracic spine: Secondary | ICD-10-CM | POA: Diagnosis not present

## 2023-02-11 DIAGNOSIS — E039 Hypothyroidism, unspecified: Secondary | ICD-10-CM | POA: Insufficient documentation

## 2023-02-11 DIAGNOSIS — I700 Atherosclerosis of aorta: Secondary | ICD-10-CM | POA: Diagnosis not present

## 2023-02-11 DIAGNOSIS — M545 Low back pain, unspecified: Secondary | ICD-10-CM | POA: Diagnosis not present

## 2023-02-11 DIAGNOSIS — M25511 Pain in right shoulder: Secondary | ICD-10-CM | POA: Insufficient documentation

## 2023-02-11 DIAGNOSIS — M549 Dorsalgia, unspecified: Secondary | ICD-10-CM

## 2023-02-11 DIAGNOSIS — N200 Calculus of kidney: Secondary | ICD-10-CM | POA: Diagnosis not present

## 2023-02-11 DIAGNOSIS — K219 Gastro-esophageal reflux disease without esophagitis: Secondary | ICD-10-CM | POA: Insufficient documentation

## 2023-02-11 DIAGNOSIS — J9811 Atelectasis: Secondary | ICD-10-CM | POA: Diagnosis not present

## 2023-02-11 DIAGNOSIS — Z79899 Other long term (current) drug therapy: Secondary | ICD-10-CM | POA: Diagnosis not present

## 2023-02-11 DIAGNOSIS — K7689 Other specified diseases of liver: Secondary | ICD-10-CM | POA: Diagnosis not present

## 2023-02-11 LAB — COMPREHENSIVE METABOLIC PANEL
ALT: 20 U/L (ref 0–44)
AST: 25 U/L (ref 15–41)
Albumin: 3.7 g/dL (ref 3.5–5.0)
Alkaline Phosphatase: 41 U/L (ref 38–126)
Anion gap: 7 (ref 5–15)
BUN: 39 mg/dL — ABNORMAL HIGH (ref 8–23)
CO2: 25 mmol/L (ref 22–32)
Calcium: 8.6 mg/dL — ABNORMAL LOW (ref 8.9–10.3)
Chloride: 106 mmol/L (ref 98–111)
Creatinine, Ser: 0.88 mg/dL (ref 0.61–1.24)
GFR, Estimated: >60 mL/min (ref >60)
Glucose, Bld: 166 mg/dL — ABNORMAL HIGH (ref 70–99)
Potassium: 3.6 mmol/L (ref 3.5–5.1)
Sodium: 138 mmol/L (ref 135–145)
Total Bilirubin: 0.6 mg/dL (ref 0.0–1.2)
Total Protein: 6.2 g/dL — ABNORMAL LOW (ref 6.5–8.1)

## 2023-02-11 LAB — URINALYSIS, ROUTINE W REFLEX MICROSCOPIC
Bacteria, UA: NONE SEEN
Bilirubin Urine: NEGATIVE
Glucose, UA: NEGATIVE mg/dL
Ketones, ur: NEGATIVE mg/dL
Leukocytes,Ua: NEGATIVE
Nitrite: NEGATIVE
Protein, ur: NEGATIVE mg/dL
Specific Gravity, Urine: 1.023 (ref 1.005–1.030)
pH: 5 (ref 5.0–8.0)

## 2023-02-11 LAB — CBC
HCT: 33.1 % — ABNORMAL LOW (ref 39.0–52.0)
Hemoglobin: 10.9 g/dL — ABNORMAL LOW (ref 13.0–17.0)
MCH: 30.6 pg (ref 26.0–34.0)
MCHC: 32.9 g/dL (ref 30.0–36.0)
MCV: 93 fL (ref 80.0–100.0)
Platelets: 153 10*3/uL (ref 150–400)
RBC: 3.56 MIL/uL — ABNORMAL LOW (ref 4.22–5.81)
RDW: 14.8 % (ref 11.5–15.5)
WBC: 7.8 10*3/uL (ref 4.0–10.5)
nRBC: 0 % (ref 0.0–0.2)

## 2023-02-11 LAB — LIPASE, BLOOD: Lipase: 102 U/L — ABNORMAL HIGH (ref 11–51)

## 2023-02-11 LAB — TROPONIN I (HIGH SENSITIVITY)
Troponin I (High Sensitivity): 7 ng/L (ref <18)
Troponin I (High Sensitivity): 7 ng/L (ref <18)

## 2023-02-11 MED ORDER — IOHEXOL 350 MG/ML SOLN
100.0000 mL | Freq: Once | INTRAVENOUS | Status: AC | PRN
  Administered 2023-02-11: 100 mL via INTRAVENOUS

## 2023-02-11 MED ORDER — SODIUM CHLORIDE (PF) 0.9 % IJ SOLN
INTRAMUSCULAR | Status: AC
  Filled 2023-02-11: qty 50

## 2023-02-11 MED ORDER — ALUM & MAG HYDROXIDE-SIMETH 200-200-20 MG/5ML PO SUSP
30.0000 mL | Freq: Once | ORAL | Status: AC
  Administered 2023-02-11: 30 mL via ORAL
  Filled 2023-02-11: qty 30

## 2023-02-11 NOTE — ED Notes (Signed)
 Pt tolerated a 12oz cup of water w/out any difficulties.

## 2023-02-11 NOTE — Discharge Instructions (Signed)
 You were seen today for generally not feeling well, back pain, shoulder pain.  Your workup is largely reassuring including heart testing.  You could have had some reflux that caused your symptoms.  Your pancreas enzymes are just mildly elevated.  However, your CT scan is reassuring.  Restart your reflux medications.

## 2023-02-11 NOTE — ED Triage Notes (Signed)
 Pt states that he feels restless and can't get comfortable. Pt states that symptoms have worsened the last hour. He is having pain in right shoulder blade and lower back and denies specific injury. Deneis CP/SOB. Pt's wife reports that pt appears pale.

## 2023-02-11 NOTE — ED Provider Notes (Signed)
 [place] [facility] AT [facility] Provider Note   CSN: [id number] Arrival date & time: 02/11/23  0009     History  Chief Complaint  Patient presents with   Back Pain    Martin Clark is a 78 y.o. male.  HPI     This is a 78 year old male who presents with right shoulder pain and back pain.  Patient reports onset of symptoms around 8 PM last night.  He states he could not get comfortable and felt very restless.  Symptoms worsen.  He is having low back pain and pain in the right shoulder.  No true chest pain.  No ripping or tearing nature to the pain.  Denies any urinary symptoms including dysuria or hematuria.  Never had symptoms like this before.  Nothing seems to make it better or worse.  Home Medications Prior to Admission medications   Medication Sig Start Date End Date Taking? Authorizing Provider  Ascorbic Acid (VITAMIN C) 500 MG CHEW Chew 1 tablet by mouth daily.   Yes [provider]  cholecalciferol (VITAMIN D3) 25 MCG (1000 UNIT) tablet Take 1,000 Units by mouth daily.   Yes [provider]  Cyanocobalamin (VITAMIN B12) 1000 MCG TBCR Take 1,000 mcg by mouth daily.   Yes [provider]  cyanocobalamin (VITAMIN B12) 500 MCG tablet Take 500 mcg by mouth daily.   Yes [provider]  levothyroxine  (SYNTHROID ) 175 MCG tablet Take 175 mcg by mouth daily before breakfast.   Yes [provider]  lisinopril -hydrochlorothiazide  (ZESTORETIC ) 20-25 MG tablet Take 1 tablet by mouth daily.   Yes [provider]  omeprazole (PRILOSEC OTC) 20 MG tablet Take 20 mg by mouth daily as needed (Acid Reflux). 12/17/22  Yes [provider]  OVER THE COUNTER MEDICATION Take 1 tablet by mouth at bedtime. flexuran take daily   Yes [provider]  pramipexole  (MIRAPEX ) 1 MG tablet Take 1 tablet (1 mg total) by mouth at bedtime. 07/01/16  Yes Jenel Carlin POUR, MD  predniSONE  (DELTASONE ) 10 MG tablet Take 10  mg by mouth daily. 12/17/22  Yes [provider]  rosuvastatin  (CRESTOR ) 20 MG tablet Take 20 mg by mouth daily. 12/17/20  Yes [provider]  traZODone (DESYREL) 50 MG tablet Take 50 mg by mouth at bedtime.   Yes [provider]  azithromycin (ZITHROMAX) 250 MG tablet Take 250 mg by mouth. Patient not taking: Reported on 02/11/2023 10/02/22   [provider]  levothyroxine  (SYNTHROID , LEVOTHROID) 150 MCG tablet Take 150 mcg by mouth daily before breakfast.    [provider]  NON FORMULARY Take 10 mg by mouth at bedtime. Sleep 3 Patient not taking: Reported on 02/11/2023    [provider]  rosuvastatin  (CRESTOR ) 10 MG tablet Take 10 mg by mouth daily. Patient not taking: Reported on 02/11/2023 01/19/23   [provider]      Allergies    Patient has no known allergies.    Review of Systems   Review of Systems  Constitutional:  Negative for fever.  Respiratory:  Negative for shortness of breath.   Cardiovascular:  Negative for chest pain.  Gastrointestinal:  Negative for abdominal pain.  Musculoskeletal:  Positive for back pain.  All other systems reviewed and are negative.   Physical Exam Updated Vital Signs BP (!) 108/93 (BP Location: Right Arm)   Pulse 64   Temp 97.6 F (36.4 C) (Oral)   Resp 16   Ht 1.778 m (5'  10)   Wt 100.7 kg   SpO2 98%   BMI 31.85 kg/m  Physical Exam Vitals and nursing note reviewed.  Constitutional:      Appearance: He is well-developed. He is not ill-appearing.  HENT:     Head: Normocephalic and atraumatic.  Eyes:     Pupils: Pupils are equal, round, and reactive to light.  Cardiovascular:     Rate and Rhythm: Normal rate and regular rhythm.     Heart sounds: Normal heart sounds. No murmur heard.    Comments: Good peripheral pulses Pulmonary:     Effort: Pulmonary effort is normal. No respiratory distress.     Breath sounds: Normal breath sounds. No wheezing.  Abdominal:      Palpations: Abdomen is soft.     Tenderness: There is no abdominal tenderness. There is no guarding or rebound.  Musculoskeletal:     Cervical back: Neck supple.     Comments: Tenderness to palpation left lower back, no CVA tenderness  Lymphadenopathy:     Cervical: No cervical adenopathy.  Skin:    General: Skin is warm and dry.  Neurological:     Mental Status: He is alert and oriented to person, place, and time.  Psychiatric:        Mood and Affect: Mood normal.     ED Results / Procedures / Treatments   Labs (all labs ordered are listed, but only abnormal results are displayed) Labs Reviewed  CBC - Abnormal; Notable for the following components:      Result Value   RBC 3.56 (*)    Hemoglobin 10.9 (*)    HCT 33.1 (*)    All other components within normal limits  COMPREHENSIVE METABOLIC PANEL - Abnormal; Notable for the following components:   Glucose, Bld 166 (*)    BUN 39 (*)    Calcium  8.6 (*)    Total Protein 6.2 (*)    All other components within normal limits  URINALYSIS, ROUTINE W REFLEX MICROSCOPIC - Abnormal; Notable for the following components:   Hgb urine dipstick MODERATE (*)    All other components within normal limits  LIPASE, BLOOD - Abnormal; Notable for the following components:   Lipase 102 (*)    All other components within normal limits  TROPONIN I (HIGH SENSITIVITY)  TROPONIN I (HIGH SENSITIVITY)    EKG EKG Interpretation Date/Time:  Thursday February 11 2023 00:23:34 EST Ventricular Rate:  68 PR Interval:  199 QRS Duration:  152 QT Interval:  420 QTC Calculation: 447 R Axis:   55  Text Interpretation: Sinus rhythm Right bundle branch block Confirmed by Bari Pfeiffer ([id number]) on 02/11/2023 4:21:20 AM  Radiology CT Angio Chest/Abd/Pel for Dissection W and/or Wo Contrast Result Date: 02/11/2023 CLINICAL DATA:  Right shoulder and back pain. Microhematuria. History of multiple sclerosis. History of kidney stones. Acute aortic syndrome  suspected. EXAM: CT ANGIOGRAPHY CHEST, ABDOMEN AND PELVIS TECHNIQUE: Non-contrast CT of the chest was initially obtained. Multidetector CT imaging through the chest, abdomen and pelvis was performed using the standard protocol during bolus administration of intravenous contrast. Multiplanar reconstructed images and MIPs were obtained and reviewed to evaluate the vascular anatomy. RADIATION DOSE REDUCTION: This exam was performed according to the departmental dose-optimization program which includes automated exposure control, adjustment of the mA and/or kV according to patient size and/or use of iterative reconstruction technique. CONTRAST:  OMNIPAQUE  IOHEXOL  350 MG/ML SOLN COMPARISON:  Same day chest radiograph; CT chest 04/15/2022; CT abdomen and pelvis 05/02/2020 FINDINGS:  CTA CHEST FINDINGS Cardiovascular: No acute intramural hematoma, penetrating atherosclerotic ulcer, aneurysm or dissection. Aortic and coronary artery atherosclerotic calcification. Normal heart size. No pericardial effusion. Mediastinum/Nodes: Trachea and esophagus are unremarkable. No thoracic adenopathy. Lungs/Pleura: Bibasilar atelectasis/scarring. The lungs are otherwise clear. No pleural effusion or pneumothorax. Musculoskeletal: No acute fracture.  Remote right rib fractures. Review of the MIP images confirms the above findings. CTA ABDOMEN AND PELVIS FINDINGS VASCULAR Aorta: Scattered atherosclerotic calcification without aneurysm or dissection. No severe narrowing. Celiac: Severe narrowing at the origin.  No aneurysm or dissection. SMA: Scattered atherosclerotic plaque without significant narrowing. Normal variant replaced common hepatic artery off of the SMA. Renals: Scattered atherosclerotic plaque without aneurysm or dissection. No hemodynamically significant stenosis. IMA: Patent. Inflow: Scattered atherosclerotic plaque without hemodynamically significant stenosis. No aneurysm or dissection. Veins: No obvious venous  abnormality within the limitations of this arterial phase study. Review of the MIP images confirms the above findings. NON-VASCULAR Hepatobiliary: Scattered hepatic cysts. Unremarkable gallbladder and biliary tree. Pancreas: Unremarkable. Spleen: Unremarkable. Adrenals/Urinary Tract: Normal adrenal glands. No urinary calculi or hydronephrosis. Unremarkable bladder. Stomach/Bowel: Normal caliber large and small bowel. No bowel wall thickening. Normal appendix. Stomach is within normal limits. Lymphatic: No lymphadenopathy. Reproductive: Unremarkable. Other: No free intraperitoneal fluid or air. Nonspecific soft tissue thickening about the umbilicus. Correlate for cellulitis. Musculoskeletal: Remote pubic rami fractures.  No acute fracture. Review of the MIP images confirms the above findings. IMPRESSION: 1. No acute aortic syndrome. 2. No acute abnormality in the chest, abdomen, or pelvis. 3. Nonspecific soft tissue thickening about the umbilicus. Correlate for cellulitis. Aortic Atherosclerosis (ICD10-I70.0). Electronically Signed   By: Norman Gatlin M.D.   On: 02/11/2023 03:54   DG Chest 2 View Result Date: 02/11/2023 CLINICAL DATA:  Right shoulder pain, discomfort EXAM: CHEST - 2 VIEW COMPARISON:  05/02/2020 FINDINGS: Bibasilar opacities, likely atelectasis. No effusions. Heart and mediastinal contours within normal limits. Aortic atherosclerosis. No acute bony abnormality. Old healed right rib fractures. IMPRESSION: Bibasilar atelectasis. Electronically Signed   By: Franky Crease M.D.   On: 02/11/2023 00:59    Procedures Procedures    Medications Ordered in ED Medications  iohexol  (OMNIPAQUE ) 350 MG/ML injection 100 mL (100 mLs Intravenous Contrast Given 02/11/23 0306)  alum & mag hydroxide-simeth (MAALOX/MYLANTA) 200-200-20 MG/5ML suspension 30 mL (30 mLs Oral Given 02/11/23 0447)    ED Course/ Medical Decision Making/ A&P                                 Medical Decision Making Amount and/or  Complexity of Data Reviewed Labs: ordered. Radiology: ordered.  Risk OTC drugs. Prescription drug management.   This patient presents to the ED for concern of back pain, shoulder pain, this involves an extensive number of treatment options, and is a complaint that carries with it a high risk of complications and morbidity.  I considered the following differential and admission for this acute, potentially life threatening condition.  The differential diagnosis includes referred shoulder pain, atypical ACS, intra-abdominal pathology such as pancreatitis, cholecystitis, AAA, kidney stone  MDM:    This is a 78 year old male who presents with back pain and shoulder pain.  Significant discomfort at home and unable to get comfortable.  On my evaluation he is awake, alert, oriented.  Vital signs are reassuring.  Overall exam is reassuring.  EKG shows no evidence of acute ischemia or arrhythmia.  Troponin x 2 negative.  Chest x-ray shows atelectasis.  I added CMP and lipase.  Significant liver function abnormalities.  Lipase is marginally elevated at 102.  CT scan chest abdomen pelvis obtained given shoulder pain and back for aortic aneurysm.  This is largely reassuring.  There is also no significant inflammation around the pancreas.  No evidence of kidney stones.  Ultimately, patient was given a GI cocktail and was able to tolerate without difficulty.  Question whether he may have had some reflux that was causing his significant discomfort and referred shoulder pain.  Discussed this with patient.  He will reinitiate his PPI.  (Labs, imaging, consults)  Labs: I Ordered, and personally interpreted labs.  The pertinent results include: CBC, CMP, lipase, troponin  Imaging Studies ordered: I ordered imaging studies including chest x-ray, CT chest abdomen pelvis I independently visualized and interpreted imaging. I agree with the radiologist interpretation  Additional history obtained from chart review.   External records from outside source obtained and reviewed including prior evaluations  Cardiac Monitoring: The patient was maintained on a cardiac monitor.  If on the cardiac monitor, I personally viewed and interpreted the cardiac monitored which showed an underlying rhythm of: Sinus  Reevaluation: After the interventions noted above, I reevaluated the patient and found that they have :improved  Social Determinants of Health:  lives independently  Disposition: Discharge  Co morbidities that complicate the patient evaluation  Past Medical History:  Diagnosis Date   Dyslipidemia    History of concussion    Hypertension    Hypothyroidism    Memory disorder    MS (multiple sclerosis) (HCC)    Obesity    Restless legs syndrome    Retinal detachment    right   RLS (restless legs syndrome) 07/01/2016   Thyroid  cancer (HCC)      Medicines Meds ordered this encounter  Medications   iohexol  (OMNIPAQUE ) 350 MG/ML injection 100 mL   alum & mag hydroxide-simeth (MAALOX/MYLANTA) 200-200-20 MG/5ML suspension 30 mL    I have reviewed the patients home medicines and have made adjustments as needed  Problem List / ED Course: Problem List Items Addressed This Visit   None Visit Diagnoses       Acute midline back pain, unspecified back location    -  Primary   Relevant Medications   predniSONE  (DELTASONE ) 10 MG tablet     Gastroesophageal reflux disease, unspecified whether esophagitis present       Relevant Medications   alum & mag hydroxide-simeth (MAALOX/MYLANTA) 200-200-20 MG/5ML suspension 30 mL (Completed)   omeprazole (PRILOSEC OTC) 20 MG tablet                   Final Clinical Impression(s) / ED Diagnoses Final diagnoses:  Acute midline back pain, unspecified back location  Gastroesophageal reflux disease, unspecified whether esophagitis present    Rx / DC Orders ED Discharge Orders     None         Bari Charmaine FALCON, MD 02/11/23 [phone]

## 2023-02-25 DIAGNOSIS — R195 Other fecal abnormalities: Secondary | ICD-10-CM | POA: Diagnosis not present

## 2023-02-25 DIAGNOSIS — D123 Benign neoplasm of transverse colon: Secondary | ICD-10-CM | POA: Diagnosis not present

## 2023-02-25 DIAGNOSIS — D120 Benign neoplasm of cecum: Secondary | ICD-10-CM | POA: Diagnosis not present

## 2023-02-25 DIAGNOSIS — D122 Benign neoplasm of ascending colon: Secondary | ICD-10-CM | POA: Diagnosis not present

## 2023-02-25 DIAGNOSIS — K5730 Diverticulosis of large intestine without perforation or abscess without bleeding: Secondary | ICD-10-CM | POA: Diagnosis not present

## 2023-03-02 DIAGNOSIS — D122 Benign neoplasm of ascending colon: Secondary | ICD-10-CM | POA: Diagnosis not present

## 2023-03-02 DIAGNOSIS — D120 Benign neoplasm of cecum: Secondary | ICD-10-CM | POA: Diagnosis not present

## 2023-03-02 DIAGNOSIS — D123 Benign neoplasm of transverse colon: Secondary | ICD-10-CM | POA: Diagnosis not present

## 2023-03-05 ENCOUNTER — Ambulatory Visit
Admission: EM | Admit: 2023-03-05 | Discharge: 2023-03-05 | Disposition: A | Discharge status: Home / Self Care | Payer: PPO | Attending: Family Medicine | Admitting: Family Medicine

## 2023-03-05 DIAGNOSIS — Z83719 Family history of colon polyps, unspecified: Secondary | ICD-10-CM | POA: Insufficient documentation

## 2023-03-05 DIAGNOSIS — J209 Acute bronchitis, unspecified: Secondary | ICD-10-CM

## 2023-03-05 DIAGNOSIS — Z0289 Encounter for other administrative examinations: Secondary | ICD-10-CM | POA: Insufficient documentation

## 2023-03-05 LAB — POCT INFLUENZA A/B
Influenza A, POC: NEGATIVE
Influenza B, POC: NEGATIVE

## 2023-03-05 MED ORDER — PREDNISONE 50 MG PO TABS: 50.0000 mg | ORAL_TABLET | Freq: Every day | ORAL | 0 refills | Status: AC

## 2023-03-05 MED ORDER — ALBUTEROL SULFATE HFA 108 (90 BASE) MCG/ACT IN AERS
2.0000 | INHALATION_SPRAY | RESPIRATORY_TRACT | 0 refills | Status: DC | PRN
Start: 2023-03-05 — End: 2023-06-08

## 2023-03-05 MED ORDER — AEROCHAMBER PLUS FLO-VU MEDIUM MISC
1.0000 | Freq: Once | Status: AC
  Administered 2023-03-05: 1

## 2023-03-05 NOTE — Discharge Instructions (Signed)
You were seen today for upper respiratory symptoms.  Your flu swab was negative today.  I have sent out an inhaler and prednisone to use for your wheezing and chest tightness.  Please continue the zpack as given from your primary care provider.  If your symptoms worsen, or you develop new fevers, then please return for re-evaluation.

## 2023-03-05 NOTE — ED Triage Notes (Signed)
Patient moved to intake/room prior then other patients due to concerns voiced for "Pneumonia due to the cough being so bad" and relevant history.  "I messaged my provider yesterday and told her what was going on and she prescribed Azithromycin for possible Pna".  Current symptoms today "still cough with tightness in chest at time of cough, mucous is thick and wheezing/sob". "I feel like I am taking full deep breaths but just concerned". No fever known.

## 2023-03-05 NOTE — ED Provider Notes (Signed)
[facility]    CSN: [id number] Arrival date & time: 03/05/23  1121      History   Chief Complaint Chief Complaint  Patient presents with   Cough   Shortness of Breath    HPI Martin Clark is a 78 y.o. male.    Cough Associated symptoms: shortness of breath and wheezing   Shortness of Breath Associated symptoms: cough and wheezing    Patient is here for cough, tightness in his chest.  Symptoms started about 3 days ago.  He did message his pcp yesterday, and given zpack.  Today he started with more coughing, wheezing, coughing up heavy mucous.  Tightness to the lower rib area.  No fevers/chills.  No runny nose, congestion.  He has h/o pneumonia x 3-4 times.  No copd/asthma.  He is taking 5mg  prednisone daily.  Weaning this from the rheumatologist.         Past Medical History:  Diagnosis Date   Dyslipidemia    History of concussion    Hypertension    Hypothyroidism    Memory disorder    MS (multiple sclerosis) (HCC)    Obesity    Restless legs syndrome    Retinal detachment    right   RLS (restless legs syndrome) 07/01/2016   Thyroid cancer [facility])     Patient Active Problem List   Diagnosis Date Noted   Encounter for other administrative examinations 03/05/2023   Family history of colonic polyps 03/05/2023   History of discitis 05/07/2022   Abnormal stress test 10/23/2021   Agatston coronary artery calcium score greater than 400 09/17/2021   Hyperlipidemia LDL goal <70 09/17/2021   Heart murmur 09/17/2021   Gait disturbance 05/06/2021   Posterior vitreous detachment of both eyes 09/16/2020   PICC (peripherally inserted central catheter) in place 05/28/2020   Discitis 05/02/2020   Hematuria 05/02/2020   AKI (acute kidney injury) (HCC) 05/02/2020   Hypotension 05/02/2020   Right epiretinal membrane 09/14/2019   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, total or subtotal 09/14/2019   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   RLS (restless  legs syndrome) 07/01/2016   Multiple sclerosis (HCC) 06/06/2012   Memory disturbance 06/06/2012   Hypothyroidism 12/24/2003   Status post vasectomy 12/24/2003   Pneumonia, organism unspecified(486) 08/10/2003   Seborrheic dermatitis 08/10/2003   Essential hypertension 07/07/2001    Past Surgical History:  Procedure Laterality Date   CATARACT EXTRACTION Right    LEFT HEART CATH AND CORONARY ANGIOGRAPHY N/A 10/28/2021   Procedure: LEFT HEART CATH AND CORONARY ANGIOGRAPHY;  Surgeon: Elder Negus, MD;  Location: [facility];  Service: Cardiovascular;  Laterality: N/A;   renal calculi     Thyroid cancer resection     TONSILLECTOMY     VASECTOMY     VASECTOMY         Home Medications    Prior to Admission medications   Medication Sig Start Date End Date Taking? Authorizing Provider  Ascorbic Acid (VITAMIN C) 500 MG CHEW Chew 1 tablet by mouth daily.   Yes [provider]  azithromycin (ZITHROMAX) 250 MG tablet Take 250 mg by mouth. 10/02/22  Yes [provider]  cholecalciferol (VITAMIN D3) 25 MCG (1000 UNIT) tablet Take 1,000 Units by mouth daily.   Yes [provider]  cyanocobalamin (VITAMIN B12) 500 MCG tablet Take 500 mcg by mouth daily.   Yes [provider]  levothyroxine (SYNTHROID) 150 MCG tablet Take 150 mcg by mouth daily before breakfast.  Yes [provider]  lisinopril-hydrochlorothiazide (ZESTORETIC) 20-25 MG tablet Take 1 tablet by mouth daily.   Yes [provider]  pramipexole (MIRAPEX) 1 MG tablet Take 1 mg by mouth daily at 6 (six) AM.   Yes [provider]  predniSONE (DELTASONE) 10 MG tablet Take 10 mg by mouth daily at 6 (six) AM.   Yes [provider]  rosuvastatin (CRESTOR) 10 MG tablet Take 10 mg by mouth daily. 01/19/23  Yes [provider]  UNABLE TO FIND Take 10 mg by mouth at bedtime. Med Name: Sleep 3   Yes [provider]  UNKNOWN TO PATIENT Take by  mouth at bedtime. Flexeron   Yes [provider]  Cyanocobalamin (VITAMIN B12) 1000 MCG TBCR Take 1,000 mcg by mouth daily.    [provider]  cyanocobalamin (VITAMIN B12) 500 MCG tablet Take 500 mcg by mouth daily.    [provider]  GAVILYTE-N WITH FLAVOR PACK 420 g solution Take 4,000 mLs by mouth See admin instructions. 02/11/23   [provider]  levothyroxine (SYNTHROID) 175 MCG tablet Take 175 mcg by mouth daily before breakfast.    [provider]  levothyroxine (SYNTHROID, LEVOTHROID) 150 MCG tablet Take 150 mcg by mouth daily before breakfast.    [provider]  lisinopril-hydrochlorothiazide (ZESTORETIC) 10-12.5 MG tablet Take 1 tablet by mouth daily.    [provider]  mupirocin ointment (BACTROBAN) 2 % Apply 1 Application topically 2 (two) times daily. 12/07/22   [provider]  NON FORMULARY Take 10 mg by mouth at bedtime. Sleep 3 Patient not taking: Reported on 02/11/2023    [provider]  omeprazole (PRILOSEC OTC) 20 MG tablet Take 20 mg by mouth daily as needed (Acid Reflux). 12/17/22   [provider]  OVER THE COUNTER MEDICATION Take 1 tablet by mouth at bedtime. flexuran take daily    [provider]  pramipexole (MIRAPEX) 1 MG tablet Take 1 tablet (1 mg total) by mouth at bedtime. 07/01/16   York Spaniel, MD  pravastatin (PRAVACHOL) 40 MG tablet Take 40 mg by mouth daily.    [provider]  predniSONE (DELTASONE) 10 MG tablet Take 10 mg by mouth daily. 12/17/22   [provider]  rosuvastatin (CRESTOR) 20 MG tablet Take 20 mg by mouth daily. 12/17/20   [provider]  traZODone (DESYREL) 50 MG tablet Take 50 mg by mouth at bedtime.    [provider]    Family History Family History  Problem Relation Age of Onset   Heart failure Mother    Heart failure Father     Social History Social History   Tobacco Use   Smoking status:  Never    Passive exposure: Never   Smokeless tobacco: Never  Vaping Use   Vaping status: Never Used  Substance Use Topics   Alcohol use: Yes    Alcohol/week: 0.0 standard drinks of alcohol    Comment: monthly   Drug use: No     Allergies   Other   Review of Systems Review of Systems  Constitutional: Negative.   HENT: Negative.    Respiratory:  Positive for cough, shortness of breath and wheezing.   Cardiovascular: Negative.   Gastrointestinal: Negative.   Genitourinary: Negative.   Musculoskeletal: Negative.   Psychiatric/Behavioral: Negative.       Physical Exam Triage Vital Signs ED Triage Vitals  Encounter Vitals Group     BP 03/05/23 1147 132/65  Systolic BP Percentile --      Diastolic BP Percentile --      Pulse Rate 03/05/23 1147 74     Resp 03/05/23 1147 (!) 22     Temp 03/05/23 1147 98.3 F (36.8 C)     Temp Source 03/05/23 1147 Oral     SpO2 03/05/23 1147 92 %     Weight 03/05/23 1140 225 lb (102.1 kg)     Height 03/05/23 1140 5\' 10"  (1.778 m)     Head Circumference --      Peak Flow --      Pain Score --      Pain Loc --      Pain Education --      Exclude from Growth Chart --    No data found.  Updated Vital Signs BP 132/65 (BP Location: Left Arm)   Pulse 74   Temp 98.3 F (36.8 C) (Oral)   Resp 20   Ht 5\' 10"  (1.778 m)   Wt 102.1 kg   SpO2 94%   BMI 32.28 kg/m   Visual Acuity Right Eye Distance:   Left Eye Distance:   Bilateral Distance:    Right Eye Near:   Left Eye Near:    Bilateral Near:     Physical Exam Constitutional:      General: He is not in acute distress.    Appearance: He is well-developed and normal weight. He is not ill-appearing, toxic-appearing or diaphoretic.  Cardiovascular:     Rate and Rhythm: Normal rate and regular rhythm.  Pulmonary:     Effort: Pulmonary effort is normal.     Breath sounds: Examination of the right-lower field reveals wheezing. Examination of the left-lower field reveals  wheezing. Wheezing present.  Musculoskeletal:        General: Normal range of motion.     Cervical back: Normal range of motion and neck supple.  Skin:    General: Skin is warm.  Neurological:     General: No focal deficit present.     Mental Status: He is alert.  Psychiatric:        Mood and Affect: Mood normal.      UC Treatments / Results  Labs (all labs ordered are listed, but only abnormal results are displayed) Labs Reviewed  POCT INFLUENZA A/B    EKG NSR;  RBBB; no change from previous  Radiology No results found.  Procedures Procedures (including critical care time)  Medications Ordered in UC Medications - No data to display  Initial Impression / Assessment and Plan / UC Course  I have reviewed the triage vital signs and the nursing notes.  Pertinent labs & imaging results that were available during my care of the patient were reviewed by me and considered in my medical decision making (see chart for details).   Final Clinical Impressions(s) / UC Diagnoses   Final diagnoses:  Acute bronchitis, unspecified organism     Discharge Instructions      You were seen today for upper respiratory symptoms.  Your flu swab was negative today.  I have sent out an inhaler and prednisone to use for your wheezing and chest tightness.  Please continue the zpack as given from your primary care provider.  If your symptoms worsen, or you develop new fevers, then please return for re-evaluation.     ED Prescriptions     Medication Sig Dispense Auth. Provider   albuterol (VENTOLIN HFA) 108 (90 Base) MCG/ACT inhaler Inhale 2 puffs  into the lungs every 4 (four) hours as needed for wheezing or shortness of breath. 1 each Suzanne Garbers, MD   predniSONE (DELTASONE) 50 MG tablet Take 1 tablet (50 mg total) by mouth daily for 5 days. 5 tablet Jannifer Franklin, MD      PDMP not reviewed this encounter.   Jannifer Franklin, MD 03/05/23 [phone]

## 2023-03-12 ENCOUNTER — Ambulatory Visit: Payer: PPO | Admitting: Internal Medicine

## 2023-03-12 ENCOUNTER — Ambulatory Visit: Payer: Self-pay | Admitting: Cardiology

## 2023-03-15 ENCOUNTER — Ambulatory Visit: Payer: PPO | Attending: Internal Medicine | Admitting: Cardiology

## 2023-03-15 ENCOUNTER — Encounter: Payer: Self-pay | Admitting: Cardiology

## 2023-03-15 VITALS — BP 120/82 | HR 71 | Resp 16 | Ht 70.0 in | Wt 238.6 lb

## 2023-03-15 DIAGNOSIS — I77810 Thoracic aortic ectasia: Secondary | ICD-10-CM | POA: Insufficient documentation

## 2023-03-15 DIAGNOSIS — R931 Abnormal findings on diagnostic imaging of heart and coronary circulation: Secondary | ICD-10-CM

## 2023-03-15 DIAGNOSIS — R011 Cardiac murmur, unspecified: Secondary | ICD-10-CM | POA: Diagnosis not present

## 2023-03-15 DIAGNOSIS — I10 Essential (primary) hypertension: Secondary | ICD-10-CM | POA: Diagnosis not present

## 2023-03-15 DIAGNOSIS — R600 Localized edema: Secondary | ICD-10-CM | POA: Diagnosis not present

## 2023-03-15 LAB — PRO B NATRIURETIC PEPTIDE: NT-Pro BNP: 308 pg/mL (ref 0–486)

## 2023-03-15 MED ORDER — ROSUVASTATIN CALCIUM 40 MG PO TABS: 40.0000 mg | ORAL_TABLET | Freq: Every day | ORAL | 3 refills | Status: AC

## 2023-03-15 NOTE — Progress Notes (Addendum)
 Cardiology Office Note:  .   Date:  03/15/2023  ID:  Martin Clark, DOB 09-20-1945, MRN [id number] PCP: Fatima Sanger, FNP  [facility] Providers Cardiologist:  Truett Mainland, MD PCP: Fatima Sanger, FNP  Chief Complaint  Patient presents with   Hypertension   Hyperlipidemia   Follow-up      History of Present Illness: .    Martin Clark is a 78 y.o. male with elevated coronary artery calcium score, dilated ascending aorta  Patient recently had URI, he is recovering from it.  He denies any chest pain, shortness of breath.  He has noticed swelling in his legs for last couple months, but denies any orthopnea, PND.  He is hoping to lose weight.  He wants to start walking more.  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   Vitals:   03/15/23 0919  BP: 120/82  Pulse: 71  Resp: 16  SpO2: 92%     ROS:  Review of Systems  Cardiovascular:  Positive for leg swelling. Negative for chest pain, dyspnea on exertion, palpitations and syncope.  Respiratory:  Positive for cough (Improving).      Studies Reviewed: Marland Kitchen         Independently interpreted 02/2023: Hb 10.9 Cr 0.8 Trop HS 7,7 Lipase 102  06/2022: Chol 163, TG 124, HDL 50, LDL 91 HbA1C 6.4% TSH 2.0  CTA chest 02/2023: 1. No acute aortic syndrome. 2. No acute abnormality in the chest, abdomen, or pelvis. 3. Nonspecific soft tissue thickening about the umbilicus. Correlate for cellulitis. 4. Aortic Atheroscleros  Independently interpreted Echocardiogram 05/2020: Mild LVH. LVEF 60-65%. Grade 1 DD. Mod LA dilatation. Ascending aorta 39 mm  Coronary angiography 10/2021: LM: Normal LAD: Minimal luminal irregularities Lcx: Minimal luminal irregularities RCA: Minimal luminal irregularities LVEDP normal  CT cardiac scoring 08/2021: LM: 118 LAD: 260 LCx: 96.7 RCA: 269   Total Agatston Score: 743 MESA database percentile: 74   AORTA MEASUREMENTS: Ascending Aorta: 4.1  cm    Physical Exam:   Physical Exam Vitals and nursing note reviewed.  Constitutional:      General: He is not in acute distress. Neck:     Vascular: No JVD.  Cardiovascular:     Rate and Rhythm: Normal rate and regular rhythm.     Heart sounds: Murmur heard.     Harsh midsystolic murmur is present with a grade of 2/6 at the upper right sternal border radiating to the neck.  Pulmonary:     Effort: Pulmonary effort is normal.     Breath sounds: Normal breath sounds. No wheezing or rales.  Musculoskeletal:     Right lower leg: Edema (1+) present.     Left lower leg: Edema (1+) present.      VISIT DIAGNOSES:   ICD-10-CM   1. Elevated coronary artery calcium score  R93.1     2. Primary hypertension  I10     3. Ascending aorta dilatation (HCC)  I77.810     4. Murmur  R01.1 ECHOCARDIOGRAM COMPLETE    5. Leg edema  R60.0 Pro b natriuretic peptide (BNP)       ASSESSMENT AND PLAN: .    Martin Clark is a 78 y.o. male with elevated coronary artery calcium score, dilated ascending aorta  Elevated coronary calcium score: Multivessel calcium, including left main.  However, no significant luminal CAD noted on cath in 2022. Recommend aggressive risk factor modification. Increase Crestor from 20 to 40 mg daily. He has  upcoming lipid panel with PCP next month.  Goal LDL <70. If LDL remains >70, recommend adding Zetia.  Murmur, leg edema: No associated symptoms, but new findings of leg edema. Discussed low-salt diet. Check proBNP, and echocardiogram.  Ascending aorta dilatation: Stable size around 4-4.1 cm.  Will recheck aorta size on echocardiogram.     Meds ordered this encounter  Medications   rosuvastatin (CRESTOR) 40 MG tablet    Sig: Take 1 tablet (40 mg total) by mouth daily.    Dispense:  90 tablet    Refill:  3     F/u in 3 months  Signed, Elder Negus, MD

## 2023-03-15 NOTE — Patient Instructions (Addendum)
 Medication Instructions  INCREASED Crestor  from 20 mg to 40 mg take one tablet daily   *If you need a refill on your cardiac medications before your next appointment, please call your pharmacy*   Lab Work: ProBNP  Testing/Procedures: ECHO  Your physician has requested that you have an echocardiogram. Echocardiography is a painless test that uses sound waves to create images of your heart. It provides your doctor with information about the size and shape of your heart and how well your heart's chambers and valves are working. This procedure takes approximately one hour. There are no restrictions for this procedure. Please do NOT wear cologne, perfume, aftershave, or lotions (deodorant is allowed). Please arrive 15 minutes prior to your appointment time.  Please note: We ask at that you not bring children with you during ultrasound (echo/ vascular) testing. Due to room size and safety concerns, children are not allowed in the ultrasound rooms during exams. Our front office staff cannot provide observation of children in our lobby area while testing is being conducted. An adult accompanying a patient to their appointment will only be allowed in the ultrasound room at the discretion of the ultrasound technician under special circumstances. We apologize for any inconvenience.    Follow-Up: At [facility], you and your health needs are our priority.  As part of our continuing mission to provide you with exceptional heart care, we have created designated Provider Care Teams.  These Care Teams include your primary Cardiologist (physician) and Advanced Practice Providers (APPs -  Physician Assistants and Nurse Practitioners) who all work together to provide you with the care you need, when you need it.  Your next appointment:   3 month(s)  Provider:   Lovette Rud, PA-C, Dayna Dunn, PA-C, Charles Connor, NP, Theotis Flake, PA-C, Slater Duncan, NP, Marlyse Single, PA-C, or Leala Prince, PA-C      Other Instructions    1st Floor: - Lobby - Registration  - Pharmacy  - Lab - Cafe  2nd Floor: - PV Lab - Diagnostic Testing (echo, CT, nuclear med)  3rd Floor: - Vacant  4th Floor: - TCTS (cardiothoracic surgery) - AFib Clinic - Structural Heart Clinic - Vascular Surgery  - Vascular Ultrasound  5th Floor: - HeartCare Cardiology (general and EP) - Clinical Pharmacy for coumadin, hypertension, lipid, weight-loss medications, and med management appointments    Valet parking services will be available as well.

## 2023-03-16 ENCOUNTER — Encounter: Payer: Self-pay | Admitting: Cardiology

## 2023-04-02 ENCOUNTER — Ambulatory Visit (HOSPITAL_COMMUNITY): Payer: PPO | Attending: Cardiology

## 2023-04-02 DIAGNOSIS — R011 Cardiac murmur, unspecified: Secondary | ICD-10-CM | POA: Diagnosis not present

## 2023-04-02 LAB — ECHOCARDIOGRAM COMPLETE
Area-P 1/2: 2.6 cm2
S' Lateral: 3.1 cm

## 2023-04-07 ENCOUNTER — Encounter: Payer: Self-pay | Admitting: Cardiology

## 2023-04-07 DIAGNOSIS — R931 Abnormal findings on diagnostic imaging of heart and coronary circulation: Secondary | ICD-10-CM

## 2023-04-07 DIAGNOSIS — I77810 Thoracic aortic ectasia: Secondary | ICD-10-CM

## 2023-04-07 DIAGNOSIS — E785 Hyperlipidemia, unspecified: Secondary | ICD-10-CM

## 2023-04-08 NOTE — Telephone Encounter (Signed)
 Recommend wearing compression stockings during the day, take them off at night and keep the legs elevated when you sleep.  With known coronary artery disease noted on CT scan, and obesity, he may qualify for [facility].  Okay to place referral to Pharm.D.

## 2023-04-09 NOTE — Telephone Encounter (Signed)
 Pts PharmD appt is scheduled for 06/02/23 at 2:45 pm with Phillips Hay.   Pt made aware of appt date and time by Scheduling Dept.

## 2023-04-09 NOTE — Telephone Encounter (Signed)
 Newnam, Arnette Schaumann M, LPN I left a VM for the pt to c/b - FYI

## 2023-04-13 DIAGNOSIS — M353 Polymyalgia rheumatica: Secondary | ICD-10-CM | POA: Diagnosis not present

## 2023-04-13 DIAGNOSIS — E039 Hypothyroidism, unspecified: Secondary | ICD-10-CM | POA: Diagnosis not present

## 2023-04-13 DIAGNOSIS — Z7952 Long term (current) use of systemic steroids: Secondary | ICD-10-CM | POA: Diagnosis not present

## 2023-04-13 DIAGNOSIS — R7309 Other abnormal glucose: Secondary | ICD-10-CM | POA: Diagnosis not present

## 2023-04-13 DIAGNOSIS — D649 Anemia, unspecified: Secondary | ICD-10-CM | POA: Diagnosis not present

## 2023-05-03 DIAGNOSIS — Z9889 Other specified postprocedural states: Secondary | ICD-10-CM | POA: Diagnosis not present

## 2023-05-03 DIAGNOSIS — H43813 Vitreous degeneration, bilateral: Secondary | ICD-10-CM | POA: Diagnosis not present

## 2023-05-03 DIAGNOSIS — H35371 Puckering of macula, right eye: Secondary | ICD-10-CM | POA: Diagnosis not present

## 2023-05-27 DIAGNOSIS — E039 Hypothyroidism, unspecified: Secondary | ICD-10-CM | POA: Diagnosis not present

## 2023-05-27 DIAGNOSIS — M1990 Unspecified osteoarthritis, unspecified site: Secondary | ICD-10-CM | POA: Diagnosis not present

## 2023-05-27 DIAGNOSIS — E785 Hyperlipidemia, unspecified: Secondary | ICD-10-CM | POA: Diagnosis not present

## 2023-05-27 DIAGNOSIS — M353 Polymyalgia rheumatica: Secondary | ICD-10-CM | POA: Diagnosis not present

## 2023-05-27 DIAGNOSIS — Z7952 Long term (current) use of systemic steroids: Secondary | ICD-10-CM | POA: Diagnosis not present

## 2023-05-27 DIAGNOSIS — R635 Abnormal weight gain: Secondary | ICD-10-CM | POA: Diagnosis not present

## 2023-06-02 ENCOUNTER — Encounter: Payer: Self-pay | Admitting: Pharmacist Clinician (PhC)/ Clinical Pharmacy Specialist

## 2023-06-02 ENCOUNTER — Ambulatory Visit: Attending: Cardiovascular Disease | Admitting: Pharmacist Clinician (PhC)/ Clinical Pharmacy Specialist

## 2023-06-02 VITALS — Ht 70.0 in | Wt 236.8 lb

## 2023-06-02 DIAGNOSIS — R7303 Prediabetes: Secondary | ICD-10-CM

## 2023-06-02 DIAGNOSIS — E66811 Obesity, class 1: Secondary | ICD-10-CM | POA: Diagnosis not present

## 2023-06-02 NOTE — Patient Instructions (Addendum)
 Get labs drawn to check A1c for diabetes.  If your A1c is 6.5 or greater we can then try to get Mounjaro or Ozempic  covered by your plan.   TIPS FOR SUCCESS Write down the reasons why you want to lose weight and post it in a place where you'll see it often. Start small and work your way up. Keep in mind that it takes time to achieve goals, and small steps add up. Any additional movements help to burn calories. Taking the stairs rather than the elevator and parking at the far end of your parking lot are easy ways to start. Brisk walking for at least 30 minutes 4 or more days of the week is an excellent goal to work toward  [employer] WHAT IT MEANS TO FEEL FULL Did you know that it can take 15 minutes or more for your brain to receive the message that you've eaten? That means that, if you eat less food, but consume it slower, you may still feel satisfied. Eating a lot of fruits and vegetables can also help you feel fuller. Eat off of smaller plates so that moderate portions don't seem too small  Follow up in 3 months.  If you have any questions or concerns, please reach out to us .  Tashi Band/Chris at [phone].  THANK YOU FOR CHOOSING [facility]

## 2023-06-02 NOTE — Assessment & Plan Note (Signed)
  Patient has not met goal of at least 5% of body weight loss with comprehensive lifestyle modifications alone in the past 3-6 months. Pharmacotherapy is appropriate to pursue as augmentation.   Confirmed patient has no family history of Multiple Endocrine Neoplasia syndrome type 2 (MEN 2).  Have reached out to PCP office to determine what type of thyroid  cancer he had.   Advised patient on common side effects including nausea, diarrhea, dyspepsia, decreased appetite, and fatigue. Counseled patient on reducing meal size and how to titrate medication to minimize side effects. Patient aware to call if intolerable side effects or if experiencing dehydration, abdominal pain, or dizziness. Patient will adhere to dietary modifications and will target at least 150 minutes of moderate intensity exercise weekly.   Injection technique reviewed at today's visit.   Currently he does not qualify for GLP under the Medicare guidelines.  Last A1c was just a year ago, at 6.4.  Will repeat lab today.  If 6.5 or greater, and confirm no medullary thyroid  cancer, then will submit request for Ozempic or Mounjaro, based on insurance preferences.

## 2023-06-02 NOTE — Progress Notes (Signed)
 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 male patient of Dr Filiberto Hug, in the office today to discuss options for weight management.  Patient is on Medicare and does not have a history of CVA, MI, peripheral revascularization or OSA.  Also has normal renal function.  Last A1c was in 2024 at 6.4.  He is currently trying to lose weight with changes to his eating habits.    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  He does have history of thyroid  cancer, many years ago while living in [place] .  Unsure if it was medullary or not.  He believes all his records are with his PCP Jhon Moselle) at [facility].    Social History:   Tobacco:no  Alcohol: occasional  Accessory Clinical Findings    Lab Results  Component Value Date   CREATININE 0.88 02/11/2023   BUN 39  (H) 02/11/2023   NA 138 02/11/2023   K 3.6 02/11/2023   CL 106 02/11/2023   CO2 25 02/11/2023   Lab Results  Component Value Date   ALT 20 02/11/2023   AST 25 02/11/2023   ALKPHOS 41 02/11/2023   BILITOT 0.6 02/11/2023   No results found for: "HGBA1C"    Home Medications/Allergies    Current Outpatient Medications  Medication Sig Dispense Refill   albuterol  (VENTOLIN  HFA) 108 (90 Base) MCG/ACT inhaler Inhale 2 puffs into the lungs every 4 (four) hours as needed for wheezing or shortness of breath. 1 each 0   Ascorbic Acid (VITAMIN C) 500 MG CHEW Chew 1 tablet by mouth daily.     cholecalciferol (VITAMIN D3) 25 MCG (1000 UNIT) tablet Take 1,000 Units by mouth daily.     cyanocobalamin (VITAMIN B12) 500 MCG tablet Take 500 mcg by mouth daily.     GAVILYTE-N WITH FLAVOR PACK 420 g solution Take 4,000 mLs by mouth See admin instructions.     levothyroxine  (SYNTHROID ) 150 MCG tablet Take 150 mcg by mouth daily before breakfast.     levothyroxine  (SYNTHROID , LEVOTHROID) 150 MCG tablet Take 150 mcg by mouth daily before breakfast.     lisinopril-hydrochlorothiazide (ZESTORETIC) 20-25 MG tablet Take 1 tablet by mouth daily.     mupirocin ointment (BACTROBAN) 2 % Apply 1 Application topically 2 (two) times  daily.     NON FORMULARY Take 10 mg by mouth at bedtime. Sleep 3     omeprazole (PRILOSEC OTC) 20 MG tablet Take 20 mg by mouth daily as needed (Acid Reflux).     OVER THE COUNTER MEDICATION Take 1 tablet by mouth at bedtime. flexuran take daily     pramipexole  (MIRAPEX ) 1 MG tablet Take 1 tablet (1 mg total) by mouth at bedtime. 90 tablet 3   predniSONE  (DELTASONE ) 10 MG tablet Take 5 mg by mouth daily.     rosuvastatin  (CRESTOR ) 40 MG tablet Take 1 tablet (40 mg total) by mouth daily. 90 tablet 3   traZODone (DESYREL) 50 MG tablet Take 50 mg by mouth at bedtime.     UNABLE TO FIND Take 10 mg by mouth at bedtime. Med Name: Sleep 3     UNKNOWN TO PATIENT Take by mouth at bedtime.  Flexeron     No current facility-administered medications for this visit.     Allergies  Allergen Reactions   Other Hives    Tide Laundry Detergent    Assessment & Plan    Obesity (BMI 30.0-34.9)  Patient has not met goal of at least 5% of body weight loss with comprehensive lifestyle modifications alone in the past 3-6 months. Pharmacotherapy is appropriate to pursue as augmentation.   Confirmed patient has no family history of Multiple Endocrine Neoplasia syndrome type 2 (MEN 2).  Have reached out to PCP office to determine what type of thyroid  cancer he had.   Advised patient on common side effects including nausea, diarrhea, dyspepsia, decreased appetite, and fatigue. Counseled patient on reducing meal size and how to titrate medication to minimize side effects. Patient aware to call if intolerable side effects or if experiencing dehydration, abdominal pain, or dizziness. Patient will adhere to dietary modifications and will target at least 150 minutes of moderate intensity exercise weekly.   Injection technique reviewed at today's visit.   Currently he does not qualify for GLP under the Medicare guidelines.  Last A1c was just a year ago, at 6.4.  Will repeat lab today.  If 6.5 or greater, and confirm no medullary thyroid  cancer, then will submit request for Ozempic or Mounjaro, based on insurance preferences.      Ala Capri PharmD CPP [facility]  [address] [place], [place] [address] [phone]

## 2023-06-03 LAB — HEMOGLOBIN A1C
Est. average glucose Bld gHb Est-mCnc: 143 mg/dL
Hgb A1c MFr Bld: 6.6 % — ABNORMAL HIGH (ref 4.8–5.6)

## 2023-06-07 NOTE — Progress Notes (Unsigned)
 Cardiology Office Note    Patient Name: Martin Clark Date of Encounter: 06/07/2023  Primary Care Provider:  Jhon Moselle, FNP Primary Cardiologist:  Cody Das, MD Primary Electrophysiologist: None   Past Medical History    Past Medical History:  Diagnosis Date   Dyslipidemia    History of concussion    Hypertension    Hypothyroidism    Memory disorder    MS (multiple sclerosis) (HCC)    Obesity    Restless legs syndrome    Retinal detachment    right   RLS (restless legs syndrome) 07/01/2016   Thyroid  cancer (HCC)     History of Present Illness  Martin Clark is a 78 y.o. male with a PMH of HTN, HLD, thyroid  CA, MS, hypothyroidism, ascending aortic dilation (42 mm), obesity who presents today for 52-month follow-up.  Martin Clark has been followed by Dr. Patwardan since 2023 for management of HTN and CAD.  He had a CT calcium  score completed that showed extensive atherosclerotic disease with calcium  score of 743.  He underwent a nuclear stress test that was positive and prompted a LHC which was completed and showed mild luminal irregularities with indication of false positive.  He was last seen by Dr. Nathanael Baker and reported recovering from a URI with no complaints of chest pain or shortness of breath.  He noted that he had started to walk more and was staying active with farming and hunting.  He was started on Crestor  40 mg due to history of elevated calcium  score and was noted to have swelling in lower extremities with proBNP completed that was normal.  He underwent an updated 2D echo that showed EF of 65 to 70% with hyperdynamic function and mild concentric LVH with moderately dilated LA and mild MR and mild calcification of AV with ascending aortic dilation of 42 mm.  Martin Clark presents today for 12-month follow-up. He experiences swelling in his legs, which has been partially alleviated by wearing compression stockings and elevating his legs. He has mild dilation of the  aorta, measuring 42 millimeters, and he limits lifting heavy objects to prevent further dilation. He mentions having neighbors who can assist him with heavy lifting. He has been experiencing low blood pressure readings, with a recent measurement of 84/48. He feels 'a little different' and fatigued at times, especially after taking his medication. He is currently on Zestoretic he reports performing activities today with little to no water.  During today's visit he was provided 3 cups of water and 1 bottle of water with improved blood pressure of 110/64 He has been actively trying to lose weight and reports a recent weight loss of 6 to 8 pounds, which he feels has improved his physical endurance. He mentions being able to walk uphill without needing to rest, which was not possible before the weight loss. No dizziness, fast heartbeats, or palpitations at home. He acknowledges the importance of staying hydrated, especially with his current medications, but sometimes forgets to drink enough water. Patient denies chest pain, palpitations, dyspnea, PND, orthopnea, nausea, vomiting, dizziness, syncope, edema, weight gain, or early satiety.  Discussed the use of AI scribe software for clinical note transcription with the patient, who gave verbal consent to proceed.  History of Present Illness   Review of Systems  Please see the history of present illness.    All other systems reviewed and are otherwise negative except as noted above.  Physical Exam    Wt Readings from Last 3 Encounters:  06/02/23 236 lb 12.8 oz (107.4 kg)  03/15/23 238 lb 9.6 oz (108.2 kg)  03/05/23 225 lb (102.1 kg)   NW:GNFAO were no vitals filed for this visit.,There is no height or weight on file to calculate BMI. GEN: Well nourished, well developed in no acute distress Neck: No JVD; No carotid bruits Pulmonary: Clear to auscultation without rales, wheezing or rhonchi  Cardiovascular: Normal rate. Regular rhythm. Normal S1. Normal  S2.   Murmurs: There is no murmur.  ABDOMEN: Soft, non-tender, non-distended EXTREMITIES:  No edema; No deformity   EKG/LABS/ Recent Cardiac Studies   ECG personally reviewed by me today -none completed today  Risk Assessment/Calculations:          Lab Results  Component Value Date   WBC 7.8 02/11/2023   HGB 10.9 (L) 02/11/2023   HCT 33.1 (L) 02/11/2023   MCV 93.0 02/11/2023   PLT 153 02/11/2023   Lab Results  Component Value Date   CREATININE 0.88 02/11/2023   BUN 39 (H) 02/11/2023   NA 138 02/11/2023   K 3.6 02/11/2023   CL 106 02/11/2023   CO2 25 02/11/2023   Lab Results  Component Value Date   CHOL  09/19/2009    164        ATP III CLASSIFICATION:  <200     mg/dL   Desirable  130-865  mg/dL   Borderline High  >=784    mg/dL   High          HDL 43 09/19/2009   LDLCALC  09/19/2009    92        Total Cholesterol/HDL:CHD Risk Coronary Heart Disease Risk Table                     Men   Women  1/2 Average Risk   3.4   3.3  Average Risk       5.0   4.4  2 X Average Risk   9.6   7.1  3 X Average Risk  23.4   11.0        Use the calculated Patient Ratio above and the CHD Risk Table to determine the patient's CHD Risk.        ATP III CLASSIFICATION (LDL):  <100     mg/dL   Optimal  696-295  mg/dL   Near or Above                    Optimal  130-159  mg/dL   Borderline  284-132  mg/dL   High  >440     mg/dL   Very High   TRIG 102 09/19/2009   CHOLHDL 3.8 09/19/2009    Lab Results  Component Value Date   HGBA1C 6.6 (H) 06/02/2023   Assessment & Plan    Assessment & Plan    1.  Coronary artery disease: -Coronary artery disease with previous false positive stress test and normal heart catheterization. Well-managed with no new symptoms.  2.  Essential hypertension: -Blood pressure low at 84/48 mmHg, likely due to overmedication after weight loss and poor p.o. intake. - Reduce Zestoretic to 10 mg/12.5 mg - Patient advised to increase fluid intake. -  Recheck blood pressure before leaving. - We will check BMET today  3.  Hyperlipidemia: - Patient's last LDL cholesterol was 83 - Continue Crestor  40 mg daily  4.  Ascending aortic dilation -Mild aortic dilation at 42 mm. Advised to avoid lifting over 50 pounds. -Continue  blood pressure control and cholesterol management. - Perform echocardiogram in February.   Disposition: Follow-up with Cody Das, MD or APP in 6 months   Signed, Francene Ing, Retha Cast, NP 06/07/2023, 9:08 PM [facility]

## 2023-06-08 ENCOUNTER — Encounter: Payer: Self-pay | Admitting: Nurse Practitioner

## 2023-06-08 ENCOUNTER — Ambulatory Visit: Payer: PPO | Attending: Nurse Practitioner | Admitting: Nurse Practitioner

## 2023-06-08 VITALS — BP 110/64 | HR 89 | Ht 70.0 in | Wt 232.8 lb

## 2023-06-08 DIAGNOSIS — I2510 Atherosclerotic heart disease of native coronary artery without angina pectoris: Secondary | ICD-10-CM | POA: Diagnosis not present

## 2023-06-08 DIAGNOSIS — E785 Hyperlipidemia, unspecified: Secondary | ICD-10-CM

## 2023-06-08 DIAGNOSIS — I77810 Thoracic aortic ectasia: Secondary | ICD-10-CM

## 2023-06-08 DIAGNOSIS — I10 Essential (primary) hypertension: Secondary | ICD-10-CM | POA: Diagnosis not present

## 2023-06-08 MED ORDER — LISINOPRIL-HYDROCHLOROTHIAZIDE 10-12.5 MG PO TABS: 1.0000 | ORAL_TABLET | Freq: Every day | ORAL | 3 refills | Status: AC

## 2023-06-08 NOTE — Patient Instructions (Signed)
 Medication Instructions:  DECREASE Zestoretic to 10/12.5mg  Take 1 tablet once a day *If you need a refill on your cardiac medications before your next appointment, please call your pharmacy*  Lab Work: TODAY-BMET If you have labs (blood work) drawn today and your tests are completely normal, you will receive your results only by: MyChart Message (if you have MyChart) OR A paper copy in the mail If you have any lab test that is abnormal or we need to change your treatment, we will call you to review the results.  Testing/Procedures: NONE ORDERED  Follow-Up: At [facility], you and your health needs are our priority.  As part of our continuing mission to provide you with exceptional heart care, our providers are all part of one team.  This team includes your primary Cardiologist (physician) and Advanced Practice Providers or APPs (Physician Assistants and Nurse Practitioners) who all work together to provide you with the care you need, when you need it.  Your next appointment:   6 month(s)  Provider:   Cody Das, MD    We recommend signing up for the patient portal called "MyChart".  Sign up information is provided on this After Visit Summary.  MyChart is used to connect with patients for Virtual Visits (Telemedicine).  Patients are able to view lab/test results, encounter notes, upcoming appointments, etc.  Non-urgent messages can be sent to your provider as well.   To learn more about what you can do with MyChart, go to [email or web address].   Other Instructions

## 2023-06-09 LAB — BASIC METABOLIC PANEL WITH GFR
BUN/Creatinine Ratio: 23 (ref 10–24)
BUN: 30 mg/dL — ABNORMAL HIGH (ref 8–27)
CO2: 28 mmol/L (ref 20–29)
Calcium: 9.6 mg/dL (ref 8.6–10.2)
Chloride: 97 mmol/L (ref 96–106)
Creatinine, Ser: 1.29 mg/dL — ABNORMAL HIGH (ref 0.76–1.27)
Glucose: 95 mg/dL (ref 70–99)
Potassium: 3.8 mmol/L (ref 3.5–5.2)
Sodium: 138 mmol/L (ref 134–144)
eGFR: 57 mL/min/{1.73_m2} — ABNORMAL LOW (ref >59)

## 2023-06-22 ENCOUNTER — Encounter: Payer: Self-pay | Admitting: Pharmacist Clinician (PhC)/ Clinical Pharmacy Specialist

## 2023-06-29 ENCOUNTER — Encounter: Payer: Self-pay | Admitting: Cardiology

## 2023-06-29 NOTE — Telephone Encounter (Signed)
 I will miss seeing him.  [facility] with the patient.  Thanks MJP
